# Patient Record
Sex: Female | Born: 1935 | Race: White | Hispanic: No | Marital: Married | Smoking: Former smoker
Health system: Southern US, Community
[De-identification: ages and names within clinical notes are randomized; demographics above are authoritative.]

## PROBLEM LIST (undated history)

## (undated) DIAGNOSIS — Z8711 Personal history of peptic ulcer disease: Secondary | ICD-10-CM

## (undated) DIAGNOSIS — I739 Peripheral vascular disease, unspecified: Secondary | ICD-10-CM

## (undated) DIAGNOSIS — D509 Iron deficiency anemia, unspecified: Secondary | ICD-10-CM

## (undated) DIAGNOSIS — I771 Stricture of artery: Secondary | ICD-10-CM

## (undated) DIAGNOSIS — K551 Chronic vascular disorders of intestine: Secondary | ICD-10-CM

## (undated) DIAGNOSIS — M545 Low back pain, unspecified: Secondary | ICD-10-CM

## (undated) DIAGNOSIS — I10 Essential (primary) hypertension: Secondary | ICD-10-CM

## (undated) DIAGNOSIS — D751 Secondary polycythemia: Secondary | ICD-10-CM

## (undated) DIAGNOSIS — J449 Chronic obstructive pulmonary disease, unspecified: Secondary | ICD-10-CM

## (undated) DIAGNOSIS — E041 Nontoxic single thyroid nodule: Secondary | ICD-10-CM

## (undated) DIAGNOSIS — Z720 Tobacco use: Secondary | ICD-10-CM

## (undated) DIAGNOSIS — I4892 Unspecified atrial flutter: Secondary | ICD-10-CM

## (undated) DIAGNOSIS — Z8719 Personal history of other diseases of the digestive system: Secondary | ICD-10-CM

## (undated) DIAGNOSIS — R634 Abnormal weight loss: Secondary | ICD-10-CM

## (undated) DIAGNOSIS — I495 Sick sinus syndrome: Secondary | ICD-10-CM

## (undated) DIAGNOSIS — M810 Age-related osteoporosis without current pathological fracture: Secondary | ICD-10-CM

## (undated) DIAGNOSIS — R911 Solitary pulmonary nodule: Secondary | ICD-10-CM

## (undated) HISTORY — PX: BLEPHAROPLASTY: SUR158

## (undated) HISTORY — DX: Iron deficiency anemia, unspecified: D50.9

## (undated) HISTORY — DX: Chronic obstructive pulmonary disease, unspecified: J44.9

## (undated) HISTORY — DX: Secondary polycythemia: D75.1

## (undated) HISTORY — PX: COLONOSCOPY: SHX174

## (undated) SURGERY — AORTIC INTERVENTION
Anesthesia: IV Sedation (MBSC Only)

---

## 1993-12-28 HISTORY — PX: STOMACH SURGERY: SHX791

## 2004-09-27 ENCOUNTER — Ambulatory Visit: Payer: Self-pay | Admitting: Internal Medicine

## 2004-10-28 ENCOUNTER — Ambulatory Visit: Payer: Self-pay | Admitting: Internal Medicine

## 2004-12-01 ENCOUNTER — Ambulatory Visit: Payer: Self-pay | Admitting: Internal Medicine

## 2004-12-23 ENCOUNTER — Ambulatory Visit: Payer: Self-pay | Admitting: Unknown Physician Specialty

## 2004-12-28 ENCOUNTER — Ambulatory Visit: Payer: Self-pay | Admitting: Internal Medicine

## 2005-01-28 ENCOUNTER — Ambulatory Visit: Payer: Self-pay | Admitting: Internal Medicine

## 2005-02-04 ENCOUNTER — Ambulatory Visit: Payer: Self-pay | Admitting: Unknown Physician Specialty

## 2005-02-25 ENCOUNTER — Ambulatory Visit: Payer: Self-pay | Admitting: Internal Medicine

## 2005-03-28 ENCOUNTER — Ambulatory Visit: Payer: Self-pay | Admitting: Internal Medicine

## 2005-04-27 ENCOUNTER — Ambulatory Visit: Payer: Self-pay | Admitting: Internal Medicine

## 2005-05-28 ENCOUNTER — Ambulatory Visit: Payer: Self-pay | Admitting: Internal Medicine

## 2005-07-31 ENCOUNTER — Ambulatory Visit: Payer: Self-pay | Admitting: Internal Medicine

## 2005-08-28 ENCOUNTER — Ambulatory Visit: Payer: Self-pay | Admitting: Internal Medicine

## 2005-09-27 ENCOUNTER — Ambulatory Visit: Payer: Self-pay | Admitting: Internal Medicine

## 2005-10-28 ENCOUNTER — Ambulatory Visit: Payer: Self-pay | Admitting: Internal Medicine

## 2005-11-27 ENCOUNTER — Ambulatory Visit: Payer: Self-pay | Admitting: Internal Medicine

## 2006-01-05 ENCOUNTER — Ambulatory Visit: Payer: Self-pay | Admitting: Unknown Physician Specialty

## 2006-01-26 ENCOUNTER — Ambulatory Visit: Payer: Self-pay | Admitting: Internal Medicine

## 2006-01-28 ENCOUNTER — Ambulatory Visit: Payer: Self-pay | Admitting: Internal Medicine

## 2006-04-27 ENCOUNTER — Ambulatory Visit: Payer: Self-pay | Admitting: Internal Medicine

## 2006-05-28 ENCOUNTER — Ambulatory Visit: Payer: Self-pay | Admitting: Internal Medicine

## 2006-06-27 ENCOUNTER — Ambulatory Visit: Payer: Self-pay | Admitting: Internal Medicine

## 2006-07-28 ENCOUNTER — Ambulatory Visit: Payer: Self-pay | Admitting: Internal Medicine

## 2006-08-28 ENCOUNTER — Ambulatory Visit: Payer: Self-pay | Admitting: Internal Medicine

## 2006-10-26 ENCOUNTER — Ambulatory Visit: Payer: Self-pay | Admitting: Internal Medicine

## 2006-10-28 ENCOUNTER — Ambulatory Visit: Payer: Self-pay | Admitting: Internal Medicine

## 2006-12-24 ENCOUNTER — Ambulatory Visit: Payer: Self-pay | Admitting: Internal Medicine

## 2006-12-28 ENCOUNTER — Ambulatory Visit: Payer: Self-pay | Admitting: Internal Medicine

## 2007-01-28 ENCOUNTER — Ambulatory Visit: Payer: Self-pay | Admitting: Internal Medicine

## 2007-03-04 ENCOUNTER — Ambulatory Visit: Payer: Self-pay | Admitting: Internal Medicine

## 2007-03-29 ENCOUNTER — Ambulatory Visit: Payer: Self-pay | Admitting: Internal Medicine

## 2007-05-03 ENCOUNTER — Ambulatory Visit: Payer: Self-pay | Admitting: Internal Medicine

## 2007-05-29 ENCOUNTER — Ambulatory Visit: Payer: Self-pay | Admitting: Internal Medicine

## 2007-06-28 ENCOUNTER — Ambulatory Visit: Payer: Self-pay | Admitting: Internal Medicine

## 2007-07-07 ENCOUNTER — Ambulatory Visit: Payer: Self-pay | Admitting: Internal Medicine

## 2007-07-21 ENCOUNTER — Ambulatory Visit: Payer: Self-pay | Admitting: Unknown Physician Specialty

## 2007-07-29 ENCOUNTER — Ambulatory Visit: Payer: Self-pay | Admitting: Internal Medicine

## 2007-08-29 ENCOUNTER — Ambulatory Visit: Payer: Self-pay | Admitting: Internal Medicine

## 2007-09-28 ENCOUNTER — Ambulatory Visit: Payer: Self-pay | Admitting: Internal Medicine

## 2007-10-29 ENCOUNTER — Ambulatory Visit: Payer: Self-pay | Admitting: Internal Medicine

## 2007-11-28 ENCOUNTER — Ambulatory Visit: Payer: Self-pay | Admitting: Internal Medicine

## 2007-12-09 ENCOUNTER — Ambulatory Visit: Payer: Self-pay | Admitting: Internal Medicine

## 2007-12-29 ENCOUNTER — Ambulatory Visit: Payer: Self-pay | Admitting: Internal Medicine

## 2008-02-03 ENCOUNTER — Ambulatory Visit: Payer: Self-pay | Admitting: Internal Medicine

## 2008-02-26 ENCOUNTER — Ambulatory Visit: Payer: Self-pay | Admitting: Internal Medicine

## 2008-03-28 ENCOUNTER — Ambulatory Visit: Payer: Self-pay | Admitting: Internal Medicine

## 2008-04-27 ENCOUNTER — Ambulatory Visit: Payer: Self-pay | Admitting: Internal Medicine

## 2008-05-28 ENCOUNTER — Ambulatory Visit: Payer: Self-pay | Admitting: Internal Medicine

## 2008-06-27 ENCOUNTER — Ambulatory Visit: Payer: Self-pay | Admitting: Internal Medicine

## 2008-07-28 ENCOUNTER — Ambulatory Visit: Payer: Self-pay | Admitting: Internal Medicine

## 2008-07-31 ENCOUNTER — Ambulatory Visit: Payer: Self-pay | Admitting: Unknown Physician Specialty

## 2008-08-28 ENCOUNTER — Ambulatory Visit: Payer: Self-pay | Admitting: Internal Medicine

## 2008-09-27 ENCOUNTER — Ambulatory Visit: Payer: Self-pay | Admitting: Internal Medicine

## 2008-10-28 ENCOUNTER — Ambulatory Visit: Payer: Self-pay | Admitting: Internal Medicine

## 2008-11-02 ENCOUNTER — Ambulatory Visit: Payer: Self-pay | Admitting: Internal Medicine

## 2008-11-27 ENCOUNTER — Ambulatory Visit: Payer: Self-pay | Admitting: Internal Medicine

## 2008-12-28 ENCOUNTER — Ambulatory Visit: Payer: Self-pay | Admitting: Internal Medicine

## 2009-01-28 ENCOUNTER — Ambulatory Visit: Payer: Self-pay | Admitting: Internal Medicine

## 2009-02-25 ENCOUNTER — Ambulatory Visit: Payer: Self-pay | Admitting: Internal Medicine

## 2009-03-28 ENCOUNTER — Ambulatory Visit: Payer: Self-pay | Admitting: Internal Medicine

## 2009-04-05 ENCOUNTER — Ambulatory Visit: Payer: Self-pay | Admitting: Internal Medicine

## 2009-04-27 ENCOUNTER — Ambulatory Visit: Payer: Self-pay | Admitting: Internal Medicine

## 2009-05-28 ENCOUNTER — Ambulatory Visit: Payer: Self-pay | Admitting: Internal Medicine

## 2009-07-05 ENCOUNTER — Ambulatory Visit: Payer: Self-pay | Admitting: Internal Medicine

## 2009-07-28 ENCOUNTER — Ambulatory Visit: Payer: Self-pay | Admitting: Internal Medicine

## 2009-08-27 ENCOUNTER — Ambulatory Visit: Payer: Self-pay | Admitting: Unknown Physician Specialty

## 2009-09-06 ENCOUNTER — Ambulatory Visit: Payer: Self-pay | Admitting: Internal Medicine

## 2009-09-27 ENCOUNTER — Ambulatory Visit: Payer: Self-pay | Admitting: Internal Medicine

## 2009-11-08 ENCOUNTER — Ambulatory Visit: Payer: Self-pay | Admitting: Internal Medicine

## 2009-11-14 ENCOUNTER — Ambulatory Visit: Payer: Self-pay | Admitting: Unknown Physician Specialty

## 2009-11-27 ENCOUNTER — Ambulatory Visit: Payer: Self-pay | Admitting: Internal Medicine

## 2009-12-28 ENCOUNTER — Ambulatory Visit: Payer: Self-pay | Admitting: Internal Medicine

## 2010-01-10 ENCOUNTER — Ambulatory Visit: Payer: Self-pay | Admitting: Internal Medicine

## 2010-01-28 ENCOUNTER — Ambulatory Visit: Payer: Self-pay | Admitting: Internal Medicine

## 2010-03-21 ENCOUNTER — Ambulatory Visit: Payer: Self-pay | Admitting: Internal Medicine

## 2010-03-28 ENCOUNTER — Ambulatory Visit: Payer: Self-pay | Admitting: Internal Medicine

## 2010-04-04 ENCOUNTER — Ambulatory Visit: Payer: Self-pay | Admitting: General Practice

## 2010-05-21 ENCOUNTER — Ambulatory Visit: Payer: Self-pay | Admitting: Internal Medicine

## 2010-05-28 ENCOUNTER — Ambulatory Visit: Payer: Self-pay | Admitting: Internal Medicine

## 2010-06-27 ENCOUNTER — Ambulatory Visit: Payer: Self-pay | Admitting: Internal Medicine

## 2010-07-30 ENCOUNTER — Ambulatory Visit: Payer: Self-pay | Admitting: Internal Medicine

## 2010-08-28 ENCOUNTER — Ambulatory Visit: Payer: Self-pay | Admitting: Internal Medicine

## 2010-09-27 ENCOUNTER — Ambulatory Visit: Payer: Self-pay | Admitting: Internal Medicine

## 2010-11-07 ENCOUNTER — Ambulatory Visit: Payer: Self-pay | Admitting: Unknown Physician Specialty

## 2010-11-19 ENCOUNTER — Ambulatory Visit: Payer: Self-pay | Admitting: Internal Medicine

## 2010-11-25 ENCOUNTER — Ambulatory Visit: Payer: Self-pay | Admitting: Unknown Physician Specialty

## 2010-11-27 ENCOUNTER — Ambulatory Visit: Payer: Self-pay | Admitting: Internal Medicine

## 2011-01-28 ENCOUNTER — Ambulatory Visit: Payer: Self-pay | Admitting: Internal Medicine

## 2011-02-26 ENCOUNTER — Ambulatory Visit: Payer: Self-pay | Admitting: Internal Medicine

## 2011-04-06 ENCOUNTER — Ambulatory Visit: Payer: Self-pay | Admitting: Internal Medicine

## 2011-04-28 ENCOUNTER — Ambulatory Visit: Payer: Self-pay | Admitting: Internal Medicine

## 2011-05-27 ENCOUNTER — Ambulatory Visit: Payer: Self-pay | Admitting: Unknown Physician Specialty

## 2011-06-08 ENCOUNTER — Ambulatory Visit: Payer: Self-pay | Admitting: Internal Medicine

## 2011-06-28 ENCOUNTER — Ambulatory Visit: Payer: Self-pay | Admitting: Internal Medicine

## 2011-09-07 ENCOUNTER — Ambulatory Visit: Payer: Self-pay | Admitting: Internal Medicine

## 2011-09-28 ENCOUNTER — Ambulatory Visit: Payer: Self-pay | Admitting: Internal Medicine

## 2011-12-02 ENCOUNTER — Ambulatory Visit: Payer: Self-pay | Admitting: Unknown Physician Specialty

## 2012-01-11 ENCOUNTER — Ambulatory Visit: Payer: Self-pay | Admitting: Internal Medicine

## 2012-01-29 ENCOUNTER — Ambulatory Visit: Payer: Self-pay | Admitting: Internal Medicine

## 2012-05-09 ENCOUNTER — Ambulatory Visit: Payer: Self-pay | Admitting: Internal Medicine

## 2012-05-09 LAB — CBC CANCER CENTER
Basophil %: 0.4 %
Eosinophil #: 0 x10 3/mm (ref 0.0–0.7)
Eosinophil %: 0.8 %
HGB: 13.9 g/dL (ref 12.0–16.0)
Lymphocyte #: 1.1 x10 3/mm (ref 1.0–3.6)
Lymphocyte %: 19.2 %
MCH: 27.1 pg (ref 26.0–34.0)
MCHC: 31.9 g/dL — ABNORMAL LOW (ref 32.0–36.0)
MCV: 85 fL (ref 80–100)
Monocyte %: 9.8 %
Neutrophil #: 4.1 x10 3/mm (ref 1.4–6.5)
Neutrophil %: 69.8 %
Platelet: 295 x10 3/mm (ref 150–440)
RBC: 5.12 10*6/uL (ref 3.80–5.20)
RDW: 17 % — ABNORMAL HIGH (ref 11.5–14.5)
WBC: 5.9 x10 3/mm (ref 3.6–11.0)

## 2012-05-28 ENCOUNTER — Ambulatory Visit: Payer: Self-pay | Admitting: Internal Medicine

## 2012-09-12 ENCOUNTER — Ambulatory Visit: Payer: Self-pay | Admitting: Internal Medicine

## 2012-09-12 LAB — CBC CANCER CENTER
Basophil #: 0 x10 3/mm (ref 0.0–0.1)
Eosinophil #: 0 x10 3/mm (ref 0.0–0.7)
Eosinophil %: 0.7 %
HCT: 44.9 % (ref 35.0–47.0)
Lymphocyte #: 1 x10 3/mm (ref 1.0–3.6)
MCH: 27 pg (ref 26.0–34.0)
MCHC: 31.8 g/dL — ABNORMAL LOW (ref 32.0–36.0)
MCV: 85 fL (ref 80–100)
Monocyte #: 0.3 x10 3/mm (ref 0.2–0.9)
Monocyte %: 5.9 %
Neutrophil %: 74.1 %
Platelet: 294 x10 3/mm (ref 150–440)
RDW: 18 % — ABNORMAL HIGH (ref 11.5–14.5)
WBC: 5.1 x10 3/mm (ref 3.6–11.0)

## 2012-09-27 ENCOUNTER — Ambulatory Visit: Payer: Self-pay | Admitting: Internal Medicine

## 2012-12-05 ENCOUNTER — Ambulatory Visit: Payer: Self-pay | Admitting: Physician Assistant

## 2012-12-28 ENCOUNTER — Ambulatory Visit: Payer: Self-pay | Admitting: Internal Medicine

## 2013-01-28 ENCOUNTER — Ambulatory Visit: Payer: Self-pay | Admitting: Internal Medicine

## 2013-02-03 LAB — CBC CANCER CENTER
Basophil #: 0 x10 3/mm (ref 0.0–0.1)
HCT: 40.1 % (ref 35.0–47.0)
Lymphocyte #: 1 x10 3/mm (ref 1.0–3.6)
Lymphocyte %: 17.2 %
MCH: 27 pg (ref 26.0–34.0)
MCHC: 32.8 g/dL (ref 32.0–36.0)
MCV: 83 fL (ref 80–100)
Monocyte #: 0.5 x10 3/mm (ref 0.2–0.9)
Monocyte %: 9 %
Neutrophil #: 4.1 x10 3/mm (ref 1.4–6.5)
Neutrophil %: 72.9 %
RBC: 4.86 10*6/uL (ref 3.80–5.20)

## 2013-02-03 LAB — IRON AND TIBC
Iron Bind.Cap.(Total): 428 ug/dL (ref 250–450)
Iron Saturation: 7 %
Unbound Iron-Bind.Cap.: 400 ug/dL

## 2013-02-03 LAB — FERRITIN: Ferritin (ARMC): 11 ng/mL (ref 8–388)

## 2013-02-03 LAB — CREATININE, SERUM: Creatinine: 0.87 mg/dL (ref 0.60–1.30)

## 2013-02-25 ENCOUNTER — Ambulatory Visit: Payer: Self-pay | Admitting: Internal Medicine

## 2013-05-09 ENCOUNTER — Ambulatory Visit: Payer: Self-pay | Admitting: Internal Medicine

## 2013-05-11 LAB — CBC CANCER CENTER
Basophil #: 0 x10 3/mm (ref 0.0–0.1)
Basophil %: 0.6 %
Eosinophil #: 0 x10 3/mm (ref 0.0–0.7)
Eosinophil %: 0.7 %
HGB: 13.4 g/dL (ref 12.0–16.0)
Lymphocyte #: 1.3 x10 3/mm (ref 1.0–3.6)
MCH: 26.3 pg (ref 26.0–34.0)
Monocyte #: 0.5 x10 3/mm (ref 0.2–0.9)
Monocyte %: 7.9 %
Neutrophil %: 72.1 %

## 2013-05-28 ENCOUNTER — Ambulatory Visit: Payer: Self-pay | Admitting: Internal Medicine

## 2013-08-17 ENCOUNTER — Ambulatory Visit: Payer: Self-pay | Admitting: Internal Medicine

## 2013-08-18 LAB — CBC CANCER CENTER
Basophil %: 0.5 %
Eosinophil #: 0 x10 3/mm (ref 0.0–0.7)
Eosinophil %: 0.8 %
HCT: 39.4 % (ref 35.0–47.0)
HGB: 13.1 g/dL (ref 12.0–16.0)
Lymphocyte %: 19 %
MCH: 26.8 pg (ref 26.0–34.0)
MCHC: 33.2 g/dL (ref 32.0–36.0)
MCV: 81 fL (ref 80–100)
Neutrophil #: 4 x10 3/mm (ref 1.4–6.5)
Neutrophil %: 69 %
Platelet: 281 x10 3/mm (ref 150–440)
RBC: 4.9 10*6/uL (ref 3.80–5.20)
RDW: 17.7 % — ABNORMAL HIGH (ref 11.5–14.5)
WBC: 5.8 x10 3/mm (ref 3.6–11.0)

## 2013-08-18 LAB — FERRITIN: Ferritin (ARMC): 11 ng/mL (ref 8–388)

## 2013-08-18 LAB — IRON AND TIBC: Iron: 30 ug/dL — ABNORMAL LOW (ref 50–170)

## 2013-08-28 ENCOUNTER — Ambulatory Visit: Payer: Self-pay | Admitting: Internal Medicine

## 2013-11-10 ENCOUNTER — Ambulatory Visit: Payer: Self-pay | Admitting: Internal Medicine

## 2013-11-10 LAB — CBC CANCER CENTER
Basophil #: 0 x10 3/mm (ref 0.0–0.1)
Basophil %: 0.8 %
Eosinophil #: 0 x10 3/mm (ref 0.0–0.7)
Eosinophil %: 0.5 %
HCT: 41.2 % (ref 35.0–47.0)
HGB: 13.3 g/dL (ref 12.0–16.0)
Lymphocyte %: 17.7 %
MCV: 81 fL (ref 80–100)
Monocyte %: 9.3 %
Neutrophil #: 3.9 x10 3/mm (ref 1.4–6.5)
Neutrophil %: 71.7 %
RBC: 5.1 10*6/uL (ref 3.80–5.20)
WBC: 5.5 x10 3/mm (ref 3.6–11.0)

## 2013-11-10 LAB — IRON AND TIBC
Iron: 44 ug/dL — ABNORMAL LOW (ref 50–170)
Unbound Iron-Bind.Cap.: 404 ug/dL

## 2013-11-27 ENCOUNTER — Ambulatory Visit: Payer: Self-pay | Admitting: Internal Medicine

## 2014-05-25 DIAGNOSIS — M810 Age-related osteoporosis without current pathological fracture: Secondary | ICD-10-CM | POA: Insufficient documentation

## 2014-05-25 DIAGNOSIS — D751 Secondary polycythemia: Secondary | ICD-10-CM | POA: Insufficient documentation

## 2014-05-25 DIAGNOSIS — D509 Iron deficiency anemia, unspecified: Secondary | ICD-10-CM | POA: Insufficient documentation

## 2014-05-25 DIAGNOSIS — J449 Chronic obstructive pulmonary disease, unspecified: Secondary | ICD-10-CM | POA: Insufficient documentation

## 2014-05-25 DIAGNOSIS — E785 Hyperlipidemia, unspecified: Secondary | ICD-10-CM | POA: Insufficient documentation

## 2014-05-25 DIAGNOSIS — R739 Hyperglycemia, unspecified: Secondary | ICD-10-CM | POA: Insufficient documentation

## 2014-06-01 ENCOUNTER — Ambulatory Visit: Payer: Self-pay | Admitting: Physician Assistant

## 2014-07-11 DIAGNOSIS — M48061 Spinal stenosis, lumbar region without neurogenic claudication: Secondary | ICD-10-CM | POA: Insufficient documentation

## 2014-07-11 DIAGNOSIS — M5116 Intervertebral disc disorders with radiculopathy, lumbar region: Secondary | ICD-10-CM | POA: Insufficient documentation

## 2014-08-10 ENCOUNTER — Ambulatory Visit: Payer: Self-pay | Admitting: Internal Medicine

## 2014-08-17 LAB — CBC CANCER CENTER
BASOS PCT: 0.8 %
Basophil #: 0 x10 3/mm (ref 0.0–0.1)
Eosinophil #: 0 x10 3/mm (ref 0.0–0.7)
Eosinophil %: 0.8 %
HCT: 29.1 % — ABNORMAL LOW (ref 35.0–47.0)
HGB: 8.7 g/dL — AB (ref 12.0–16.0)
LYMPHS PCT: 27.2 %
Lymphocyte #: 1.6 x10 3/mm (ref 1.0–3.6)
MCH: 18.9 pg — ABNORMAL LOW (ref 26.0–34.0)
MCHC: 29.7 g/dL — ABNORMAL LOW (ref 32.0–36.0)
MCV: 64 fL — ABNORMAL LOW (ref 80–100)
Monocyte #: 0.6 x10 3/mm (ref 0.2–0.9)
Monocyte %: 10.7 %
Neutrophil #: 3.6 x10 3/mm (ref 1.4–6.5)
Neutrophil %: 60.5 %
Platelet: 391 x10 3/mm (ref 150–440)
RBC: 4.58 10*6/uL (ref 3.80–5.20)
RDW: 21.5 % — ABNORMAL HIGH (ref 11.5–14.5)
WBC: 5.9 x10 3/mm (ref 3.6–11.0)

## 2014-08-28 ENCOUNTER — Ambulatory Visit: Payer: Self-pay | Admitting: Internal Medicine

## 2014-08-31 LAB — OCCULT BLOOD X 1 CARD TO LAB, STOOL
OCCULT BLOOD, FECES: POSITIVE
Occult Blood, Feces: NEGATIVE
Occult Blood, Feces: POSITIVE

## 2014-08-31 LAB — CBC CANCER CENTER
BASOS PCT: 0.8 %
Basophil #: 0 x10 3/mm (ref 0.0–0.1)
EOS PCT: 1.2 %
Eosinophil #: 0.1 x10 3/mm (ref 0.0–0.7)
HCT: 35.6 % (ref 35.0–47.0)
HGB: 10.7 g/dL — ABNORMAL LOW (ref 12.0–16.0)
Lymphocyte #: 1.1 x10 3/mm (ref 1.0–3.6)
Lymphocyte %: 18.5 %
MCH: 21.8 pg — ABNORMAL LOW (ref 26.0–34.0)
MCHC: 30.2 g/dL — ABNORMAL LOW (ref 32.0–36.0)
MCV: 72 fL — ABNORMAL LOW (ref 80–100)
Monocyte #: 0.5 x10 3/mm (ref 0.2–0.9)
Monocyte %: 8.9 %
Neutrophil #: 4.1 x10 3/mm (ref 1.4–6.5)
Neutrophil %: 70.6 %
Platelet: 339 x10 3/mm (ref 150–440)
RBC: 4.92 10*6/uL (ref 3.80–5.20)
RDW: 38 % — ABNORMAL HIGH (ref 11.5–14.5)
WBC: 5.8 x10 3/mm (ref 3.6–11.0)

## 2014-09-14 LAB — CANCER CENTER HEMOGLOBIN: HGB: 13.4 g/dL (ref 12.0–16.0)

## 2014-09-27 ENCOUNTER — Ambulatory Visit: Payer: Self-pay | Admitting: Internal Medicine

## 2014-09-28 LAB — CANCER CENTER HEMATOCRIT: HCT: 46.1 % (ref 35.0–47.0)

## 2014-10-03 DIAGNOSIS — Z86010 Personal history of colonic polyps: Secondary | ICD-10-CM | POA: Insufficient documentation

## 2014-10-25 ENCOUNTER — Ambulatory Visit: Payer: Self-pay | Admitting: Unknown Physician Specialty

## 2014-10-28 ENCOUNTER — Ambulatory Visit: Payer: Self-pay | Admitting: Internal Medicine

## 2014-11-06 ENCOUNTER — Ambulatory Visit: Payer: Self-pay | Admitting: Physician Assistant

## 2014-11-08 LAB — COMPREHENSIVE METABOLIC PANEL
ALK PHOS: 63 U/L
ANION GAP: 6 — AB (ref 7–16)
Albumin: 3.1 g/dL — ABNORMAL LOW (ref 3.4–5.0)
BUN: 13 mg/dL (ref 7–18)
Bilirubin,Total: 0.3 mg/dL (ref 0.2–1.0)
CALCIUM: 7.8 mg/dL — AB (ref 8.5–10.1)
CO2: 31 mmol/L (ref 21–32)
CREATININE: 0.79 mg/dL (ref 0.60–1.30)
Chloride: 103 mmol/L (ref 98–107)
EGFR (African American): >60
EGFR (Non-African Amer.): >60
Glucose: 126 mg/dL — ABNORMAL HIGH (ref 65–99)
Osmolality: 281 (ref 275–301)
POTASSIUM: 3.7 mmol/L (ref 3.5–5.1)
SGOT(AST): 27 U/L (ref 15–37)
SGPT (ALT): 23 U/L
Sodium: 140 mmol/L (ref 136–145)
Total Protein: 6.3 g/dL — ABNORMAL LOW (ref 6.4–8.2)

## 2014-11-08 LAB — CANCER CENTER HEMOGLOBIN: HGB: 13.8 g/dL (ref 12.0–16.0)

## 2014-11-08 LAB — LIPID PANEL
Cholesterol: 175 mg/dL (ref 0–200)
HDL: 68 mg/dL — AB (ref 40–60)
LDL CHOLESTEROL, CALC: 85 mg/dL (ref 0–100)
Triglycerides: 109 mg/dL (ref 0–200)
VLDL CHOLESTEROL, CALC: 22 mg/dL (ref 5–40)

## 2014-11-08 LAB — TSH: Thyroid Stimulating Horm: 1.45 u[IU]/mL

## 2014-11-08 LAB — HEMOGLOBIN A1C: Hemoglobin A1C: 6.2 % (ref 4.2–6.3)

## 2014-11-27 ENCOUNTER — Ambulatory Visit: Payer: Self-pay | Admitting: Internal Medicine

## 2014-12-06 LAB — CANCER CENTER HEMATOCRIT: HCT: 41.9 % (ref 35.0–47.0)

## 2014-12-18 ENCOUNTER — Ambulatory Visit: Payer: Self-pay | Admitting: Physician Assistant

## 2014-12-28 ENCOUNTER — Ambulatory Visit: Payer: Self-pay | Admitting: Internal Medicine

## 2015-01-17 LAB — CBC CANCER CENTER
BASOS PCT: 0.6 %
Basophil #: 0 x10 3/mm (ref 0.0–0.1)
Eosinophil #: 0 x10 3/mm (ref 0.0–0.7)
Eosinophil %: 0.4 %
HCT: 43.5 % (ref 35.0–47.0)
HGB: 14 g/dL (ref 12.0–16.0)
Lymphocyte #: 1.1 x10 3/mm (ref 1.0–3.6)
Lymphocyte %: 15.6 %
MCH: 27 pg (ref 26.0–34.0)
MCHC: 32.3 g/dL (ref 32.0–36.0)
MCV: 84 fL (ref 80–100)
Monocyte #: 0.5 x10 3/mm (ref 0.2–0.9)
Monocyte %: 7.5 %
Neutrophil #: 5.3 x10 3/mm (ref 1.4–6.5)
Neutrophil %: 75.9 %
Platelet: 380 x10 3/mm (ref 150–440)
RBC: 5.21 10*6/uL — ABNORMAL HIGH (ref 3.80–5.20)
RDW: 16.2 % — AB (ref 11.5–14.5)
WBC: 6.9 x10 3/mm (ref 3.6–11.0)

## 2015-01-28 ENCOUNTER — Ambulatory Visit: Payer: Self-pay | Admitting: Internal Medicine

## 2015-03-13 ENCOUNTER — Ambulatory Visit
Admit: 2015-03-13 | Disposition: A | Discharge status: Home / Self Care | Payer: Self-pay | Attending: Internal Medicine | Admitting: Internal Medicine

## 2015-03-13 ENCOUNTER — Ambulatory Visit: Admit: 2015-03-13 | Payer: Self-pay | Admitting: Unknown Physician Specialty

## 2015-03-27 LAB — IRON AND TIBC
Iron Bind.Cap.(Total): 424 (ref 250–450)
Iron Saturation: 6.8
Iron: 29 ug/dL
UNBOUND IRON-BIND. CAP.: 394.6

## 2015-03-27 LAB — FERRITIN: Ferritin (ARMC): 11 ng/mL

## 2015-03-27 LAB — OCCULT BLOOD X 1 CARD TO LAB, STOOL
OCCULT BLOOD, FECES: NEGATIVE
OCCULT BLOOD, FECES: POSITIVE
Occult Blood, Feces: POSITIVE

## 2015-03-27 LAB — CANCER CENTER HEMOGLOBIN: HGB: 13.6 g/dL (ref 12.0–16.0)

## 2015-03-29 ENCOUNTER — Ambulatory Visit
Admit: 2015-03-29 | Disposition: A | Discharge status: Home / Self Care | Payer: Self-pay | Attending: Internal Medicine | Admitting: Internal Medicine

## 2015-04-01 DIAGNOSIS — M5136 Other intervertebral disc degeneration, lumbar region: Secondary | ICD-10-CM | POA: Insufficient documentation

## 2015-05-24 ENCOUNTER — Ambulatory Visit: Payer: Self-pay

## 2015-05-24 ENCOUNTER — Ambulatory Visit: Payer: Self-pay | Admitting: Internal Medicine

## 2016-01-17 ENCOUNTER — Encounter: Payer: Self-pay | Admitting: *Deleted

## 2016-01-24 NOTE — Discharge Instructions (Signed)

## 2016-01-27 ENCOUNTER — Ambulatory Visit: Payer: Medicare HMO | Admitting: Anesthesiology

## 2016-01-27 ENCOUNTER — Encounter
Admission: RE | Disposition: A | Discharge status: Home / Self Care | Payer: Self-pay | Source: Ambulatory Visit | Attending: Ophthalmology

## 2016-01-27 ENCOUNTER — Ambulatory Visit
Admission: RE | Admit: 2016-01-27 | Discharge: 2016-01-27 | Disposition: A | Discharge status: Home / Self Care | Payer: Medicare HMO | Source: Ambulatory Visit | Attending: Ophthalmology | Admitting: Ophthalmology

## 2016-01-27 DIAGNOSIS — M810 Age-related osteoporosis without current pathological fracture: Secondary | ICD-10-CM | POA: Diagnosis not present

## 2016-01-27 DIAGNOSIS — I10 Essential (primary) hypertension: Secondary | ICD-10-CM | POA: Insufficient documentation

## 2016-01-27 DIAGNOSIS — Z880 Allergy status to penicillin: Secondary | ICD-10-CM | POA: Diagnosis not present

## 2016-01-27 DIAGNOSIS — H25041 Posterior subcapsular polar age-related cataract, right eye: Secondary | ICD-10-CM | POA: Insufficient documentation

## 2016-01-27 DIAGNOSIS — Z87891 Personal history of nicotine dependence: Secondary | ICD-10-CM | POA: Insufficient documentation

## 2016-01-27 DIAGNOSIS — Z79899 Other long term (current) drug therapy: Secondary | ICD-10-CM | POA: Diagnosis not present

## 2016-01-27 DIAGNOSIS — H269 Unspecified cataract: Secondary | ICD-10-CM | POA: Diagnosis present

## 2016-01-27 DIAGNOSIS — Z9889 Other specified postprocedural states: Secondary | ICD-10-CM | POA: Diagnosis not present

## 2016-01-27 HISTORY — PX: CATARACT EXTRACTION W/PHACO: SHX586

## 2016-01-27 HISTORY — DX: Low back pain, unspecified: M54.50

## 2016-01-27 HISTORY — DX: Age-related osteoporosis without current pathological fracture: M81.0

## 2016-01-27 HISTORY — DX: Personal history of peptic ulcer disease: Z87.11

## 2016-01-27 HISTORY — DX: Personal history of other diseases of the digestive system: Z87.19

## 2016-01-27 HISTORY — DX: Low back pain: M54.5

## 2016-01-27 SURGERY — PHACOEMULSIFICATION, CATARACT, WITH IOL INSERTION
Anesthesia: Monitor Anesthesia Care | Laterality: Right | Wound class: Clean

## 2016-01-27 MED ORDER — BRIMONIDINE TARTRATE 0.2 % OP SOLN
OPHTHALMIC | Status: DC | PRN
  Administered 2016-01-27: 1 [drp] via OPHTHALMIC

## 2016-01-27 MED ORDER — MOXIFLOXACIN HCL 0.5 % OP SOLN
OPHTHALMIC | Status: DC | PRN
Start: 2016-01-27 — End: 2016-01-27
  Administered 2016-01-27: 1 [drp] via OPHTHALMIC

## 2016-01-27 MED ORDER — TIMOLOL MALEATE 0.5 % OP SOLN
OPHTHALMIC | Status: DC | PRN
  Administered 2016-01-27: 1 [drp] via OPHTHALMIC

## 2016-01-27 MED ORDER — MIDAZOLAM HCL 2 MG/2ML IJ SOLN
INTRAMUSCULAR | Status: DC | PRN
  Administered 2016-01-27: 2 mg via INTRAVENOUS

## 2016-01-27 MED ORDER — LIDOCAINE HCL (PF) 4 % IJ SOLN
INTRAOCULAR | Status: DC | PRN
  Administered 2016-01-27: 1 mL via OPHTHALMIC

## 2016-01-27 MED ORDER — EPINEPHRINE HCL 1 MG/ML IJ SOLN
INTRAOCULAR | Status: DC | PRN
  Administered 2016-01-27: 97 mL via OPHTHALMIC

## 2016-01-27 MED ORDER — POVIDONE-IODINE 5 % OP SOLN
1.0000 "application " | OPHTHALMIC | Status: DC | PRN
  Administered 2016-01-27: 1 via OPHTHALMIC

## 2016-01-27 MED ORDER — NA HYALUR & NA CHOND-NA HYALUR 0.4-0.35 ML IO KIT
PACK | INTRAOCULAR | Status: DC | PRN
  Administered 2016-01-27: 1 mL via INTRAOCULAR

## 2016-01-27 MED ORDER — TETRACAINE HCL 0.5 % OP SOLN
1.0000 [drp] | OPHTHALMIC | Status: DC | PRN
  Administered 2016-01-27: 1 [drp] via OPHTHALMIC

## 2016-01-27 MED ORDER — FENTANYL CITRATE (PF) 100 MCG/2ML IJ SOLN
INTRAMUSCULAR | Status: DC | PRN
  Administered 2016-01-27: 50 ug via INTRAVENOUS

## 2016-01-27 MED ORDER — ARMC OPHTHALMIC DILATING GEL
1.0000 "application " | OPHTHALMIC | Status: DC | PRN
  Administered 2016-01-27 (×2): 1 via OPHTHALMIC

## 2016-01-27 SURGICAL SUPPLY — 29 items
APPLICATOR COTTON TIP 3IN (MISCELLANEOUS) ×3 IMPLANT
CANNULA ANT/CHMB 27GA (MISCELLANEOUS) ×3 IMPLANT
DISSECTOR HYDRO NUCLEUS 50X22 (MISCELLANEOUS) ×3 IMPLANT
GLOVE BIO SURGEON STRL SZ7 (GLOVE) ×3 IMPLANT
GLOVE SURG LX 6.5 MICRO (GLOVE) ×2
GLOVE SURG LX STRL 6.5 MICRO (GLOVE) ×1 IMPLANT
GOWN STRL REUS W/ TWL LRG LVL3 (GOWN DISPOSABLE) ×2 IMPLANT
GOWN STRL REUS W/TWL LRG LVL3 (GOWN DISPOSABLE) ×4
LENS IOL ACRSF IQ PC 21.5 (Intraocular Lens) ×1 IMPLANT
LENS IOL ACRYSOF IQ POST 21.5 (Intraocular Lens) ×3 IMPLANT
MARKER SKIN SURG W/RULER VIO (MISCELLANEOUS) ×3 IMPLANT
NEEDLE FILTER BLUNT 18X 1/2SAF (NEEDLE) ×2
NEEDLE FILTER BLUNT 18X1 1/2 (NEEDLE) ×1 IMPLANT
PACK CATARACT BRASINGTON (MISCELLANEOUS) ×3 IMPLANT
PACK EYE AFTER SURG (MISCELLANEOUS) ×3 IMPLANT
PACK OPTHALMIC (MISCELLANEOUS) ×3 IMPLANT
RING MALYGIN 7.0 (MISCELLANEOUS) IMPLANT
SOL BAL SALT 15ML (MISCELLANEOUS)
SOLUTION BAL SALT 15ML (MISCELLANEOUS) IMPLANT
SUT ETHILON 10-0 CS-B-6CS-B-6 (SUTURE)
SUT VICRYL  9 0 (SUTURE)
SUT VICRYL 9 0 (SUTURE) IMPLANT
SUTURE EHLN 10-0 CS-B-6CS-B-6 (SUTURE) IMPLANT
SYR 3ML LL SCALE MARK (SYRINGE) ×3 IMPLANT
SYR TB 1ML LUER SLIP (SYRINGE) ×3 IMPLANT
WATER STERILE IRR 250ML POUR (IV SOLUTION) ×3 IMPLANT
WATER STERILE IRR 500ML POUR (IV SOLUTION) IMPLANT
WICK EYE OCUCEL (MISCELLANEOUS) IMPLANT
WIPE NON LINTING 3.25X3.25 (MISCELLANEOUS) ×3 IMPLANT

## 2016-01-27 NOTE — Op Note (Signed)
Date of Surgery: 01/27/2016  PREOPERATIVE DIAGNOSES: Visually significant posterior subcapsular cataract, right eye.  POSTOPERATIVE DIAGNOSES: Same  PROCEDURES PERFORMED: Cataract extraction with intraocular lens implant, right eye.  SURGEON: Almon Hercules, M.D.  ANESTHESIA: MAC and topical  IMPLANTS: AcrySof IQ [id number] +21.5 D   Implant Name Type Inv. Item Serial No. Manufacturer Lot No. LRB No. Used  IMPLANT LENS - [id number] Intraocular Lens IMPLANT LENS [id number] ALCON   Right 1     COMPLICATIONS: None.  DESCRIPTION OF PROCEDURE: Therapeutic options were discussed with the patient preoperatively, including a discussion of risks and benefits of surgery. Informed consent was obtained. An IOL-Master and immersion biometry were used to take the lens measurements, and a dilated fundus exam was performed within 6 months of the surgical date.  The patient was premedicated and brought to the operating room and placed on the operating table in the supine position. After adequate anesthesia, the patient was prepped and draped in the usual sterile ophthalmic fashion. A wire lid speculum was inserted and the microscope was positioned. A Superblade was used to create a paracentesis site at the limbus and a small amount of dilute preservative free lidocaine was instilled into the anterior chamber, followed by dispersive viscoelastic. A clear corneal incision was created temporally using a 2.4 mm keratome blade. Capsulorrhexis was then performed. In situ phacoemulsification was performed.  Cortical material was removed with the irrigation-aspiration unit. Dispersive viscoelastic was instilled to open the capsular bag. A posterior chamber intraocular lens with the specifications above was inserted and positioned. Irrigation-aspiration was used to remove all viscoelastic. Vigamox 1cc was instilled into the anterior chamber, and the corneal incision was checked and found to be water tight. The eyelid  speculum was removed.  The operative eye was covered with protective goggles after instilling 1 drop of timolol and brimonidine. The patient tolerated the procedure well. There were no complications.

## 2016-01-27 NOTE — Anesthesia Preprocedure Evaluation (Signed)
Anesthesia Evaluation  Patient identified by MRN, date of birth, ID band Patient awake    Reviewed: Allergy & Precautions, NPO status , Patient's Chart, lab work & pertinent test results  Airway Mallampati: II  TM Distance: >3 FB Neck ROM: full    Dental   Pulmonary Current Smoker,    breath sounds clear to auscultation       Cardiovascular hypertension, Normal cardiovascular exam     Neuro/Psych    GI/Hepatic   Endo/Other    Renal/GU      Musculoskeletal   Abdominal   Peds  Hematology   Anesthesia Other Findings   Reproductive/Obstetrics                             Anesthesia Physical Anesthesia Plan  ASA: II  Anesthesia Plan: MAC   Post-op Pain Management:    Induction:   Airway Management Planned:   Additional Equipment:   Intra-op Plan:   Post-operative Plan:   Informed Consent: I have reviewed the patients History and Physical, chart, labs and discussed the procedure including the risks, benefits and alternatives for the proposed anesthesia with the patient or authorized representative who has indicated his/her understanding and acceptance.     Plan Discussed with: CRNA  Anesthesia Plan Comments:         Anesthesia Quick Evaluation

## 2016-01-27 NOTE — Transfer of Care (Signed)
Immediate Anesthesia Transfer of Care Note  Patient: Lydia [name]  Procedure(s) Performed: Procedure(s): CATARACT EXTRACTION PHACO AND INTRAOCULAR LENS PLACEMENT (IOC) (Right)  Patient Location: PACU  Anesthesia Type: MAC  Level of Consciousness: awake, alert  and patient cooperative  Airway and Oxygen Therapy: Patient Spontanous Breathing and Patient connected to supplemental oxygen  Post-op Assessment: Post-op Vital signs reviewed, Patient's Cardiovascular Status Stable, Respiratory Function Stable, Patent Airway and No signs of Nausea or vomiting  Post-op Vital Signs: Reviewed and stable  Complications: No apparent anesthesia complications

## 2016-01-27 NOTE — H&P (Signed)
H+P reviewed and is up to date, please see paper chart.  

## 2016-01-27 NOTE — Anesthesia Procedure Notes (Signed)
Procedure Name: MAC Performed by: Roneshia Drew Pre-anesthesia Checklist: Patient identified, Emergency Drugs available, Suction available, Timeout performed and Patient being monitored Patient Re-evaluated:Patient Re-evaluated prior to inductionOxygen Delivery Method: Nasal cannula Placement Confirmation: positive ETCO2     

## 2016-01-27 NOTE — Anesthesia Postprocedure Evaluation (Signed)
Anesthesia Post Note  Patient: Lydia [name]  Procedure(s) Performed: Procedure(s) (LRB): CATARACT EXTRACTION PHACO AND INTRAOCULAR LENS PLACEMENT (IOC) (Right)  Patient location during evaluation: PACU Anesthesia Type: MAC Level of consciousness: awake and alert Pain management: pain level controlled Vital Signs Assessment: post-procedure vital signs reviewed and stable Respiratory status: spontaneous breathing, nonlabored ventilation, respiratory function stable and patient connected to nasal cannula oxygen Cardiovascular status: stable and blood pressure returned to baseline Anesthetic complications: no    Amaryllis Dyke

## 2016-01-28 ENCOUNTER — Encounter: Payer: Self-pay | Admitting: Ophthalmology

## 2016-03-04 ENCOUNTER — Other Ambulatory Visit: Payer: Self-pay | Admitting: Physician Assistant

## 2016-03-04 DIAGNOSIS — Z1231 Encounter for screening mammogram for malignant neoplasm of breast: Secondary | ICD-10-CM

## 2016-03-12 ENCOUNTER — Ambulatory Visit: Payer: Medicare Other

## 2016-03-20 ENCOUNTER — Other Ambulatory Visit (HOSPITAL_COMMUNITY): Payer: Self-pay | Admitting: Neurosurgery

## 2016-03-20 DIAGNOSIS — M5416 Radiculopathy, lumbar region: Secondary | ICD-10-CM

## 2016-03-26 ENCOUNTER — Inpatient Hospital Stay: Payer: Medicare HMO | Admitting: Internal Medicine

## 2016-03-26 ENCOUNTER — Encounter: Payer: Self-pay | Admitting: *Deleted

## 2016-04-13 ENCOUNTER — Ambulatory Visit: Payer: Medicare HMO

## 2016-04-14 ENCOUNTER — Ambulatory Visit
Admission: RE | Admit: 2016-04-14 | Discharge: 2016-04-14 | Disposition: A | Discharge status: Home / Self Care | Payer: Medicare HMO | Source: Ambulatory Visit | Attending: Neurosurgery | Admitting: Neurosurgery

## 2016-04-14 DIAGNOSIS — M5116 Intervertebral disc disorders with radiculopathy, lumbar region: Secondary | ICD-10-CM | POA: Insufficient documentation

## 2016-04-14 DIAGNOSIS — M4806 Spinal stenosis, lumbar region: Secondary | ICD-10-CM | POA: Insufficient documentation

## 2016-04-14 DIAGNOSIS — M4186 Other forms of scoliosis, lumbar region: Secondary | ICD-10-CM | POA: Diagnosis not present

## 2016-04-14 DIAGNOSIS — M5416 Radiculopathy, lumbar region: Secondary | ICD-10-CM | POA: Insufficient documentation

## 2016-04-15 ENCOUNTER — Other Ambulatory Visit: Payer: Self-pay | Admitting: *Deleted

## 2016-04-15 DIAGNOSIS — D751 Secondary polycythemia: Secondary | ICD-10-CM

## 2016-04-16 ENCOUNTER — Inpatient Hospital Stay: Payer: Medicare HMO | Attending: Internal Medicine | Admitting: Internal Medicine

## 2016-04-16 ENCOUNTER — Encounter: Payer: Self-pay | Admitting: Internal Medicine

## 2016-04-16 ENCOUNTER — Telehealth: Payer: Self-pay | Admitting: *Deleted

## 2016-04-16 ENCOUNTER — Ambulatory Visit: Payer: Medicare HMO

## 2016-04-16 VITALS — BP 138/79 | HR 84 | Temp 96.5°F | Resp 18 | Ht 66.0 in | Wt 88.2 lb

## 2016-04-16 DIAGNOSIS — R634 Abnormal weight loss: Secondary | ICD-10-CM | POA: Diagnosis not present

## 2016-04-16 DIAGNOSIS — M810 Age-related osteoporosis without current pathological fracture: Secondary | ICD-10-CM | POA: Diagnosis not present

## 2016-04-16 DIAGNOSIS — M5136 Other intervertebral disc degeneration, lumbar region: Secondary | ICD-10-CM | POA: Insufficient documentation

## 2016-04-16 DIAGNOSIS — D508 Other iron deficiency anemias: Secondary | ICD-10-CM

## 2016-04-16 DIAGNOSIS — G8929 Other chronic pain: Secondary | ICD-10-CM

## 2016-04-16 DIAGNOSIS — I10 Essential (primary) hypertension: Secondary | ICD-10-CM | POA: Diagnosis not present

## 2016-04-16 DIAGNOSIS — D751 Secondary polycythemia: Secondary | ICD-10-CM | POA: Diagnosis not present

## 2016-04-16 DIAGNOSIS — Z880 Allergy status to penicillin: Secondary | ICD-10-CM | POA: Diagnosis not present

## 2016-04-16 DIAGNOSIS — R5383 Other fatigue: Secondary | ICD-10-CM | POA: Insufficient documentation

## 2016-04-16 DIAGNOSIS — J449 Chronic obstructive pulmonary disease, unspecified: Secondary | ICD-10-CM | POA: Diagnosis not present

## 2016-04-16 DIAGNOSIS — R05 Cough: Secondary | ICD-10-CM | POA: Diagnosis not present

## 2016-04-16 DIAGNOSIS — D509 Iron deficiency anemia, unspecified: Secondary | ICD-10-CM | POA: Diagnosis not present

## 2016-04-16 DIAGNOSIS — M4186 Other forms of scoliosis, lumbar region: Secondary | ICD-10-CM | POA: Insufficient documentation

## 2016-04-16 DIAGNOSIS — F17210 Nicotine dependence, cigarettes, uncomplicated: Secondary | ICD-10-CM | POA: Insufficient documentation

## 2016-04-16 DIAGNOSIS — M545 Low back pain: Secondary | ICD-10-CM | POA: Insufficient documentation

## 2016-04-16 DIAGNOSIS — Z79899 Other long term (current) drug therapy: Secondary | ICD-10-CM | POA: Insufficient documentation

## 2016-04-16 DIAGNOSIS — Z8719 Personal history of other diseases of the digestive system: Secondary | ICD-10-CM | POA: Diagnosis not present

## 2016-04-16 DIAGNOSIS — M4806 Spinal stenosis, lumbar region: Secondary | ICD-10-CM | POA: Insufficient documentation

## 2016-04-16 LAB — URINALYSIS COMPLETE WITH MICROSCOPIC (ARMC ONLY)
BILIRUBIN URINE: NEGATIVE
GLUCOSE, UA: NEGATIVE mg/dL
Hgb urine dipstick: NEGATIVE
KETONES UR: NEGATIVE mg/dL
NITRITE: NEGATIVE
Protein, ur: 30 mg/dL — AB
SPECIFIC GRAVITY, URINE: 1.018 (ref 1.005–1.030)
pH: 6 (ref 5.0–8.0)

## 2016-04-16 LAB — COMPREHENSIVE METABOLIC PANEL
ALT: 24 U/L (ref 14–54)
AST: 28 U/L (ref 15–41)
Albumin: 3.8 g/dL (ref 3.5–5.0)
Alkaline Phosphatase: 54 U/L (ref 38–126)
Anion gap: 4 — ABNORMAL LOW (ref 5–15)
BUN: 26 mg/dL — ABNORMAL HIGH (ref 6–20)
CHLORIDE: 100 mmol/L — AB (ref 101–111)
CO2: 32 mmol/L (ref 22–32)
CREATININE: 0.73 mg/dL (ref 0.44–1.00)
Calcium: 8.4 mg/dL — ABNORMAL LOW (ref 8.9–10.3)
Glucose, Bld: 70 mg/dL (ref 65–99)
POTASSIUM: 3.9 mmol/L (ref 3.5–5.1)
Sodium: 136 mmol/L (ref 135–145)
TOTAL PROTEIN: 6.5 g/dL (ref 6.5–8.1)
Total Bilirubin: 0.4 mg/dL (ref 0.3–1.2)

## 2016-04-16 LAB — CBC WITH DIFFERENTIAL/PLATELET
Basophils Absolute: 0 10*3/uL (ref 0–0.1)
Basophils Relative: 0 %
Eosinophils Absolute: 0 10*3/uL (ref 0–0.7)
Eosinophils Relative: 0 %
HCT: 35 % (ref 35.0–47.0)
Hemoglobin: 10.8 g/dL — ABNORMAL LOW (ref 12.0–16.0)
LYMPHS ABS: 1 10*3/uL (ref 1.0–3.6)
LYMPHS PCT: 15 %
MCH: 20.5 pg — AB (ref 26.0–34.0)
MCHC: 30.9 g/dL — ABNORMAL LOW (ref 32.0–36.0)
MCV: 66.5 fL — AB (ref 80.0–100.0)
MONO ABS: 0.5 10*3/uL (ref 0.2–0.9)
MONOS PCT: 8 %
Neutro Abs: 5.1 10*3/uL (ref 1.4–6.5)
Neutrophils Relative %: 77 %
PLATELETS: 323 10*3/uL (ref 150–440)
RBC: 5.26 MIL/uL — ABNORMAL HIGH (ref 3.80–5.20)
RDW: 24.2 % — AB (ref 11.5–14.5)
WBC: 6.6 10*3/uL (ref 3.6–11.0)

## 2016-04-16 LAB — LACTATE DEHYDROGENASE: LDH: 173 U/L (ref 98–192)

## 2016-04-16 LAB — RETICULOCYTES
RBC.: 5.26 MIL/uL — ABNORMAL HIGH (ref 3.80–5.20)
RETIC COUNT ABSOLUTE: 68.4 10*3/uL (ref 19.0–183.0)
Retic Ct Pct: 1.3 % (ref 0.4–3.1)

## 2016-04-16 LAB — FERRITIN: FERRITIN: 11 ng/mL (ref 11–307)

## 2016-04-16 LAB — IRON AND TIBC
IRON: 18 ug/dL — AB (ref 28–170)
Saturation Ratios: 4 % — ABNORMAL LOW (ref 10.4–31.8)
TIBC: 412 ug/dL (ref 250–450)
UIBC: 394 ug/dL

## 2016-04-16 NOTE — Telephone Encounter (Signed)
I called patient with her lab results this afternoon per her request. I asked pt to keep all of her upcoming appointments for IV iron.

## 2016-04-16 NOTE — Progress Notes (Signed)
[facility] OFFICE PROGRESS NOTE  Patient Care Team: Marinda Elk, MD as PCP - General (Physician Assistant)   SUMMARY OF ONCOLOGIC HISTORY:  # IDA  [Colonoscopy last Nov 2015- hem pos stool- incomplete; Dr. Elliot]  # POLYCYTHEMIA SECONDARY s/p Phlebotomy; none at least 2015  INTERVAL HISTORY:  This is my first interaction with the patient since I joined the practice September 2016. I reviewed the patient's prior charts/pertinent labs in detail; findings are summarized above.   A pleasant 82 year old female patient with history of iron deficiency anemia is here for follow-up. Patient stated that she has been tired especially with exertion. She denies any blood in stools. Denies any black stools. She does not take any by mouth iron-because of constipation. Denies any alteration of all habits. Denies any blood in urine.   Patient has had mild weight loss the last many months; patient is chronic cough; she smokes. No headaches or vision changes. Chronic back pain.   REVIEW OF SYSTEMS:  A complete 10 point review of system is done which is negative except mentioned above/history of present illness.   PAST MEDICAL HISTORY :  Past Medical History  Diagnosis Date  . Hypertension   . Osteoporosis   . Lower back pain   . History of stomach ulcers     40 yrs ago  . Polycythemia   . COPD (chronic obstructive pulmonary disease) ([place])   . IDA (iron deficiency anemia)     PAST SURGICAL HISTORY :   Past Surgical History  Procedure Laterality Date  . Blepharoplasty    . Stomach surgery  1995    for ulcers  . Colonoscopy    . Cataract extraction w/phaco Right 01/27/2016    Procedure: CATARACT EXTRACTION PHACO AND INTRAOCULAR LENS PLACEMENT (IOC);  Surgeon: Ronnell Freshwater, MD;  Location: [facility];  Service: Ophthalmology;  Laterality: Right;  . Colonoscopy  2005, 2010    FAMILY HISTORY :   Family History  Problem Relation Age of Onset  .  Breast cancer Sister     SOCIAL HISTORY:   Social History  Substance Use Topics  . Smoking status: Current Every Day Smoker -- 1.00 packs/day for 65 years    Types: Cigarettes  . Smokeless tobacco: Never Used  . Alcohol Use: No    ALLERGIES:  is allergic to penicillins.  MEDICATIONS:  Current Outpatient Prescriptions  Medication Sig Dispense Refill  . amLODipine (NORVASC) 5 MG tablet Take 5 mg by mouth daily. AM    . CALCIUM PO Take by mouth daily. AM    . Cholecalciferol (VITAMIN D PO) Take by mouth daily. AM    . metoprolol (LOPRESSOR) 100 MG tablet Take 100 mg by mouth daily. AM     No current facility-administered medications for this visit.    PHYSICAL EXAMINATION:   BP 138/79 mmHg  Pulse 84  Temp(Src) 96.5 F (35.8 C) (Tympanic)  Resp 18  Ht 5\' 6"  (1.676 m)  Wt 88 lb 2.9 oz (40 kg)  BMI 14.24 kg/m2  SpO2 98%  Filed Weights   04/16/16 1136  Weight: 88 lb 2.9 oz (40 kg)    GENERAL: Thin appearing moderately nourished Caucasian female patient Alert, no distress and comfortable.  EYES: no pallor or icterus OROPHARYNX: no thrush or ulceration; NECK: supple, no masses felt LYMPH:  no palpable lymphadenopathy in the cervical, axillary or inguinal regions LUNGS: Barrel Shaped chest ; decreased air entry bilaterally. No wheeze or crackles HEART/CVS: regular rate &  rhythm and no murmurs; No lower extremity edema ABDOMEN:abdomen soft, non-tender and normal bowel sounds Musculoskeletal:no cyanosis of digits and no clubbing kyphosis/scoliosis noted  PSYCH: alert & oriented x 3 with fluent speech NEURO: no focal motor/sensory deficits SKIN:  no rashes or significant lesions  LABORATORY DATA:  I have reviewed the data as listed    Component Value Date/Time   NA 140 11/08/2014 1341   K 3.7 11/08/2014 1341   CL 103 11/08/2014 1341   CO2 31 11/08/2014 1341   GLUCOSE 126* 11/08/2014 1341   BUN 13 11/08/2014 1341   CREATININE 0.79 11/08/2014 1341   CALCIUM 7.8*  11/08/2014 1341   PROT 6.3* 11/08/2014 1341   ALBUMIN 3.1* 11/08/2014 1341   AST 27 11/08/2014 1341   ALT 23 11/08/2014 1341   ALKPHOS 63 11/08/2014 1341   BILITOT 0.3 11/08/2014 1341   GFRNONAA >60 11/08/2014 1341   GFRNONAA >60 02/03/2013 1047   GFRAA >60 11/08/2014 1341   GFRAA >60 02/03/2013 1047    No results found for: SPEP, UPEP  Lab Results  Component Value Date   WBC 6.9 01/17/2015   NEUTROABS 5.3 01/17/2015   HGB 13.6 03/27/2015   HCT 43.5 01/17/2015   MCV 84 01/17/2015   PLT 380 01/17/2015      Chemistry      Component Value Date/Time   NA 140 11/08/2014 1341   K 3.7 11/08/2014 1341   CL 103 11/08/2014 1341   CO2 31 11/08/2014 1341   BUN 13 11/08/2014 1341   CREATININE 0.79 11/08/2014 1341      Component Value Date/Time   CALCIUM 7.8* 11/08/2014 1341   ALKPHOS 63 11/08/2014 1341   AST 27 11/08/2014 1341   ALT 23 11/08/2014 1341   BILITOT 0.3 11/08/2014 1341       RADIOGRAPHIC STUDIES: I have personally reviewed the radiological images as listed and agreed with the findings in the report. Mr Lumbar Spine Wo Contrast  04/14/2016  CLINICAL DATA:  82 year old female with lumbar back pain radiating to the left lower extremity for 2 years, slowly progressive. Radiculopathy. Subsequent encounter. EXAM: MRI LUMBAR SPINE WITHOUT CONTRAST TECHNIQUE: Multiplanar, multisequence MR imaging of the lumbar spine was performed. No intravenous contrast was administered. COMPARISON:  Lumbar MRI 06/01/2014. [facility] lumbar radiograph report 12/04/2013 (no images available). FINDINGS: Same numbering system as on the 2015 MRI designating normal lumbar segmentation. Levoconvex lumbar scoliosis with significant rotatory component appears mildly progressed since that time. There was associated left lateral listhesis of L2 on L3 with advanced chronic disc degeneration re- demonstrated at that level. There is also advanced left far lateral disc and endplate degeneration at  L4-L5. At both of those levels there is trace increased STIR signal in the disc space. There is new posterior left paracentral L4 endplate marrow edema, but this most resembles interval development of a Schmorl's node. No definite epidural inflammation. There is psoas muscle atrophy. There is left lumbosacral junction level erector spinae muscle atrophy with some associated STIR hyperintensity (series 4, image 21) which is chronic. Visualized lower thoracic spinal cord is normal with conus medularis at L2. Negative visualized abdominal and pelvic viscera. T10-T11:  Mild facet hypertrophy. T11-T12: Mild facet hypertrophy. T12-L1:  Mild facet hypertrophy. L1-L2: Mild to moderate facet hypertrophy. Chronic right facet joint fluid. No significant stenosis. L2-L3: Chronic disc and endplate degeneration with left greater than right far lateral disc osteophyte complex. Moderate facet and ligament flavum hypertrophy. Chronic multifactorial spinal stenosis appears stable and moderate.  This is at the level of the conus. No conus signal abnormality. Stable moderate to severe right L2 foraminal stenosis and right lateral recess stenosis. L3-L4: Increased chronic multifactorial spinal stenosis, now moderate to severe. Left eccentric circumferential disc osteophyte complex with increased now severe ligament flavum hypertrophy, and chronic moderate to severe facet hypertrophy. Posteriorly situated synovial cyst on the left is chronic. Right facet joint fluid is increased. There is a tiny subligamentous synovial cyst on the right not contributing to stenosis at this time (series 5, image 30). Moderate bilateral L3 foraminal stenosis is stable. Severe left lateral recess stenosis has progressed. L4-L5: Chronic severe multifactorial spinal stenosis. Previously-seen left lateral recess and foraminal level synovial cysts have regressed but not completely resolved (series 2, image 12). Severe to very severe left L4 foraminal and lateral  recess stenosis. Severe right lateral recess stenosis. Mild right L4 foraminal stenosis. Overall this level has not significantly changed. L5-S1: Mild grade 1 anterolisthesis is stable. Chronic left eccentric disc/pseudo disc with severe facet hypertrophy and chronic facet joint fluid. Moderate left ligament flavum hypertrophy. No significant spinal stenosis. Chronic moderate to severe left and mild right lateral recess stenosis. Chronic mild to moderate left and mild right L5 foraminal stenosis. This level is stable. IMPRESSION: 1. Since 2015 lumbar scoliosis and chronic L4-L5 disc and endplate degeneration have progressed. Abnormal disc and endplate signal at [id number] and L4-L5 appears to be degenerative in nature. 2. Regressed but not completely resolved left lateral recess and foraminal level synovial cysts at L4-L5. However, multifactorial severe to very severe spinal, left greater than right lateral recess, and left foraminal stenosis have not significantly changed. 3. Progressed and now moderate to severe multifactorial spinal stenosis at L3-L4 with severe left lateral recess stenosis. Moderate bilateral foraminal stenosis is stable. 4. Stable moderate multifactorial spinal stenosis at the level of the conus at L2-L3. Moderate to severe right lateral recess and right foraminal stenosis are stable. 5. Stable chronic moderate to severe left lateral recess and up to moderate left foraminal stenosis at L5-S1. Electronically Signed   By: Genevie Ann M.D.   On: 04/14/2016 14:28     ASSESSMENT & PLAN:   # Iron deficiency anemia- recent hemoglobin with PCP in March 2017- 9.3 MCV 68. Etiology is unclear. We'll repeat CBC and iron studies today ferritin LDH and reticulocyte count; stool occult 2; urine analysis. Recommend IV Feraheme starting next week x2.  # Patient will likely need repeat GI evaluation; check stool occult; again IV Feraheme in one month/ follow-up in one month.   # Chronic smoker- patient not  interested in smoking cessation.      [name], MD 04/16/2016 11:52 AM

## 2016-04-17 ENCOUNTER — Other Ambulatory Visit: Payer: Self-pay

## 2016-04-17 ENCOUNTER — Telehealth: Payer: Self-pay | Admitting: *Deleted

## 2016-04-17 DIAGNOSIS — D509 Iron deficiency anemia, unspecified: Secondary | ICD-10-CM | POA: Diagnosis not present

## 2016-04-17 DIAGNOSIS — D508 Other iron deficiency anemias: Secondary | ICD-10-CM

## 2016-04-17 LAB — OCCULT BLOOD X 1 CARD TO LAB, STOOL: Fecal Occult Bld: POSITIVE — AB

## 2016-04-17 NOTE — Telephone Encounter (Signed)
-----   Message from [name], MD sent at 04/17/2016  3:41 PM EDT ----- Please inform patient that her stool occult card is positive; she should contact her PCP ASAP/for GI referral for EGD colonoscopy. Thx

## 2016-04-17 NOTE — Telephone Encounter (Signed)
Pt called back. I explained to patient that her stool cards were positive for blood in stools. I asked her to contact her primary care for a GI referral . She states that Dr. Vira Agar has performed her colonoscopies in the past. She will contact her primary care and Dr. Percell Boston office to see when Dr. Percell [place] team could see her.

## 2016-04-17 NOTE — Telephone Encounter (Signed)
with husband to have pt call cancer center back

## 2016-04-24 ENCOUNTER — Inpatient Hospital Stay: Payer: Medicare HMO

## 2016-04-24 VITALS — BP 136/74 | HR 55 | Temp 96.6°F | Resp 20

## 2016-04-24 DIAGNOSIS — D509 Iron deficiency anemia, unspecified: Secondary | ICD-10-CM | POA: Diagnosis not present

## 2016-04-24 MED ORDER — FERUMOXYTOL INJECTION 510 MG/17 ML
510.0000 mg | Freq: Once | INTRAVENOUS | Status: AC
  Administered 2016-04-24: 510 mg via INTRAVENOUS
  Filled 2016-04-24: qty 17

## 2016-04-24 MED ORDER — SODIUM CHLORIDE 0.9 % IV SOLN
Freq: Once | INTRAVENOUS | Status: AC
  Administered 2016-04-24: 10:00:00 via INTRAVENOUS
  Filled 2016-04-24: qty 1000

## 2016-04-28 ENCOUNTER — Other Ambulatory Visit: Payer: Self-pay | Admitting: Nurse Practitioner

## 2016-04-28 DIAGNOSIS — R634 Abnormal weight loss: Secondary | ICD-10-CM

## 2016-04-29 ENCOUNTER — Other Ambulatory Visit: Payer: Self-pay | Admitting: Nurse Practitioner

## 2016-04-29 DIAGNOSIS — R634 Abnormal weight loss: Secondary | ICD-10-CM

## 2016-04-29 DIAGNOSIS — F17200 Nicotine dependence, unspecified, uncomplicated: Secondary | ICD-10-CM

## 2016-04-29 DIAGNOSIS — R9389 Abnormal findings on diagnostic imaging of other specified body structures: Secondary | ICD-10-CM

## 2016-05-01 ENCOUNTER — Inpatient Hospital Stay: Payer: Medicare HMO | Attending: Internal Medicine

## 2016-05-01 DIAGNOSIS — F17210 Nicotine dependence, cigarettes, uncomplicated: Secondary | ICD-10-CM | POA: Diagnosis not present

## 2016-05-01 DIAGNOSIS — M810 Age-related osteoporosis without current pathological fracture: Secondary | ICD-10-CM | POA: Diagnosis not present

## 2016-05-01 DIAGNOSIS — R5383 Other fatigue: Secondary | ICD-10-CM | POA: Diagnosis not present

## 2016-05-01 DIAGNOSIS — I10 Essential (primary) hypertension: Secondary | ICD-10-CM | POA: Diagnosis not present

## 2016-05-01 DIAGNOSIS — D509 Iron deficiency anemia, unspecified: Secondary | ICD-10-CM | POA: Diagnosis present

## 2016-05-01 DIAGNOSIS — D751 Secondary polycythemia: Secondary | ICD-10-CM | POA: Diagnosis not present

## 2016-05-01 DIAGNOSIS — R05 Cough: Secondary | ICD-10-CM | POA: Diagnosis not present

## 2016-05-01 DIAGNOSIS — Z79899 Other long term (current) drug therapy: Secondary | ICD-10-CM | POA: Insufficient documentation

## 2016-05-01 DIAGNOSIS — R634 Abnormal weight loss: Secondary | ICD-10-CM | POA: Insufficient documentation

## 2016-05-01 DIAGNOSIS — G8929 Other chronic pain: Secondary | ICD-10-CM | POA: Diagnosis not present

## 2016-05-01 DIAGNOSIS — M549 Dorsalgia, unspecified: Secondary | ICD-10-CM | POA: Diagnosis not present

## 2016-05-01 DIAGNOSIS — K921 Melena: Secondary | ICD-10-CM | POA: Diagnosis not present

## 2016-05-01 DIAGNOSIS — J449 Chronic obstructive pulmonary disease, unspecified: Secondary | ICD-10-CM | POA: Insufficient documentation

## 2016-05-01 MED ORDER — FERUMOXYTOL INJECTION 510 MG/17 ML
510.0000 mg | Freq: Once | INTRAVENOUS | Status: AC
  Administered 2016-05-01: 510 mg via INTRAVENOUS
  Filled 2016-05-01: qty 17

## 2016-05-01 MED ORDER — SODIUM CHLORIDE 0.9 % IV SOLN
Freq: Once | INTRAVENOUS | Status: AC
  Administered 2016-05-01: 14:00:00 via INTRAVENOUS
  Filled 2016-05-01: qty 1000

## 2016-05-04 ENCOUNTER — Ambulatory Visit: Admission: RE | Admit: 2016-05-04 | Payer: Medicare HMO | Source: Ambulatory Visit

## 2016-05-07 ENCOUNTER — Encounter: Payer: Medicare HMO | Attending: Surgery | Admitting: Surgery

## 2016-05-07 ENCOUNTER — Ambulatory Visit
Admission: RE | Admit: 2016-05-07 | Discharge: 2016-05-07 | Disposition: A | Discharge status: Home / Self Care | Payer: Medicare HMO | Source: Ambulatory Visit | Attending: Nurse Practitioner | Admitting: Nurse Practitioner

## 2016-05-07 DIAGNOSIS — M810 Age-related osteoporosis without current pathological fracture: Secondary | ICD-10-CM | POA: Diagnosis not present

## 2016-05-07 DIAGNOSIS — J479 Bronchiectasis, uncomplicated: Secondary | ICD-10-CM | POA: Diagnosis not present

## 2016-05-07 DIAGNOSIS — I10 Essential (primary) hypertension: Secondary | ICD-10-CM | POA: Insufficient documentation

## 2016-05-07 DIAGNOSIS — I2510 Atherosclerotic heart disease of native coronary artery without angina pectoris: Secondary | ICD-10-CM | POA: Insufficient documentation

## 2016-05-07 DIAGNOSIS — R9389 Abnormal findings on diagnostic imaging of other specified body structures: Secondary | ICD-10-CM

## 2016-05-07 DIAGNOSIS — E041 Nontoxic single thyroid nodule: Secondary | ICD-10-CM | POA: Diagnosis not present

## 2016-05-07 DIAGNOSIS — R634 Abnormal weight loss: Secondary | ICD-10-CM

## 2016-05-07 DIAGNOSIS — D509 Iron deficiency anemia, unspecified: Secondary | ICD-10-CM | POA: Insufficient documentation

## 2016-05-07 DIAGNOSIS — L89152 Pressure ulcer of sacral region, stage 2: Secondary | ICD-10-CM | POA: Diagnosis present

## 2016-05-07 DIAGNOSIS — E440 Moderate protein-calorie malnutrition: Secondary | ICD-10-CM | POA: Insufficient documentation

## 2016-05-07 DIAGNOSIS — R938 Abnormal findings on diagnostic imaging of other specified body structures: Secondary | ICD-10-CM | POA: Diagnosis present

## 2016-05-07 DIAGNOSIS — M4185 Other forms of scoliosis, thoracolumbar region: Secondary | ICD-10-CM | POA: Diagnosis not present

## 2016-05-07 DIAGNOSIS — D259 Leiomyoma of uterus, unspecified: Secondary | ICD-10-CM | POA: Diagnosis not present

## 2016-05-07 DIAGNOSIS — M47895 Other spondylosis, thoracolumbar region: Secondary | ICD-10-CM | POA: Diagnosis not present

## 2016-05-07 DIAGNOSIS — J449 Chronic obstructive pulmonary disease, unspecified: Secondary | ICD-10-CM | POA: Diagnosis not present

## 2016-05-07 DIAGNOSIS — M5135 Other intervertebral disc degeneration, thoracolumbar region: Secondary | ICD-10-CM | POA: Diagnosis not present

## 2016-05-07 DIAGNOSIS — F17218 Nicotine dependence, cigarettes, with other nicotine-induced disorders: Secondary | ICD-10-CM | POA: Insufficient documentation

## 2016-05-07 DIAGNOSIS — R64 Cachexia: Secondary | ICD-10-CM | POA: Insufficient documentation

## 2016-05-07 DIAGNOSIS — R911 Solitary pulmonary nodule: Secondary | ICD-10-CM | POA: Insufficient documentation

## 2016-05-07 DIAGNOSIS — J439 Emphysema, unspecified: Secondary | ICD-10-CM | POA: Diagnosis not present

## 2016-05-07 DIAGNOSIS — I700 Atherosclerosis of aorta: Secondary | ICD-10-CM | POA: Insufficient documentation

## 2016-05-07 DIAGNOSIS — F17200 Nicotine dependence, unspecified, uncomplicated: Secondary | ICD-10-CM

## 2016-05-07 MED ORDER — IOPAMIDOL (ISOVUE-300) INJECTION 61%
85.0000 mL | Freq: Once | INTRAVENOUS | Status: AC | PRN
  Administered 2016-05-07: 85 mL via INTRAVENOUS

## 2016-05-08 NOTE — Progress Notes (Signed)
Lydia [name], Lydia [name] ([id number]) Visit Report for 05/07/2016 Allergy List Details Patient Name: Lydia [name], Lydia [name]. Date of Service: 05/07/2016 8:00 AM Medical Record Number: [id number] Patient Account Number: [id number] Date of Birth/Sex: May 31, 1927 (82 y.o. Female) Treating RN: Cornell Barman Primary Care Physician: Paulita Cradle Other Clinician: Referring Physician: Gaylyn Cheers Treating Physician/Extender: Frann Rider in Treatment: 0 Allergies Active Allergies Penicillins Allergy Notes Electronic Signature(s) Signed: 05/07/2016 5:50:20 PM By: Gretta Cool, RN, BSN, Kim RN, BSN Entered By: Gretta Cool, RN, BSN, Kim on 05/07/2016 08:14:40 Lydia [name] ([id number]) -------------------------------------------------------------------------------- Arrival Information Details Patient Name: Lydia Lora [name]. Date of Service: 05/07/2016 8:00 AM Medical Record Number: [id number] Patient Account Number: [id number] Date of Birth/Sex: 1927-01-23 (82 y.o. Female) Treating RN: Cornell Barman Primary Care Physician: Paulita Cradle Other Clinician: Referring Physician: Gaylyn Cheers Treating Physician/Extender: Frann Rider in Treatment: 0 Visit Information Patient Arrived: Ambulatory Arrival Time: 08:11 Accompanied By: self Transfer Assistance: None Patient Identification Verified: Yes Secondary Verification Process Yes Completed: Patient Requires Transmission-Based No Precautions: Patient Has Alerts: No Electronic Signature(s) Signed: 05/07/2016 5:50:20 PM By: Gretta Cool, RN, BSN, Kim RN, BSN Entered By: Gretta Cool, RN, BSN, Kim on 05/07/2016 08:11:59 Lydia [name], Lydia [name] ([id number]) -------------------------------------------------------------------------------- Clinic Level of Care Assessment Details Patient Name: Lydia Lora [name]. Date of Service: 05/07/2016 8:00 AM Medical Record Number: [id number] Patient Account Number: [id number] Date of Birth/Sex: [birth year] (82 y.o. Female) Treating RN:  Cornell Barman Primary Care Physician: Paulita Cradle Other Clinician: Referring Physician: Gaylyn Cheers Treating Physician/Extender: Frann Rider in Treatment: 0 Clinic Level of Care Assessment Items TOOL 2 Quantity Score []  - Use when only an EandM is performed on the INITIAL visit 0 ASSESSMENTS - Nursing Assessment / Reassessment X - General Physical Exam (combine w/ comprehensive assessment (listed just 1 20 below) when performed on new pt. evals) X - Comprehensive Assessment (HX, ROS, Risk Assessments, Wounds Hx, etc.) 1 25 ASSESSMENTS - Wound and Skin Assessment / Reassessment X - Simple Wound Assessment / Reassessment - one wound 1 5 []  - Complex Wound Assessment / Reassessment - multiple wounds 0 []  - Dermatologic / Skin Assessment (not related to wound area) 0 ASSESSMENTS - Ostomy and/or Continence Assessment and Care []  - Incontinence Assessment and Management 0 []  - Ostomy Care Assessment and Management (repouching, etc.) 0 PROCESS - Coordination of Care X - Simple Patient / Family Education for ongoing care 1 15 []  - Complex (extensive) Patient / Family Education for ongoing care 0 X - Staff obtains Programmer, systems, Records, Test Results / Process Orders 1 10 []  - Staff telephones HHA, Nursing Homes / Clarify orders / etc 0 []  - Routine Transfer to another Facility (non-emergent condition) 0 []  - Routine Hospital Admission (non-emergent condition) 0 []  - New Admissions / Biomedical engineer / Ordering NPWT, Apligraf, etc. 0 []  - Emergency Hospital Admission (emergent condition) 0 X - Simple Discharge Coordination 1 10 Lydia [name], Lydia [name]. ([id number]) []  - Complex (extensive) Discharge Coordination 0 PROCESS - Special Needs []  - Pediatric / Minor Patient Management 0 []  - Isolation Patient Management 0 []  - Hearing / Language / Visual special needs 0 []  - Assessment of Community assistance (transportation, [name]/C planning, etc.) 0 []  - Additional assistance / Altered  mentation 0 []  - Support Surface(s) Assessment (bed, cushion, seat, etc.) 0 INTERVENTIONS - Wound Cleansing / Measurement X - Wound Imaging (photographs - any number of wounds) 1 5 []  - Wound Tracing (instead of photographs) 0 X - Simple Wound Measurement - one wound 1 5 []  - Complex  Wound Measurement - multiple wounds 0 X - Simple Wound Cleansing - one wound 1 5 []  - Complex Wound Cleansing - multiple wounds 0 INTERVENTIONS - Wound Dressings X - Small Wound Dressing one or multiple wounds 1 10 []  - Medium Wound Dressing one or multiple wounds 0 []  - Large Wound Dressing one or multiple wounds 0 []  - Application of Medications - injection 0 INTERVENTIONS - Miscellaneous []  - External ear exam 0 []  - Specimen Collection (cultures, biopsies, blood, body fluids, etc.) 0 []  - Specimen(s) / Culture(s) sent or taken to Lab for analysis 0 []  - Patient Transfer (multiple staff / Civil Service fast streamer / Similar devices) 0 []  - Simple Staple / Suture removal (25 or less) 0 []  - Complex Staple / Suture removal (26 or more) 0 Lydia [name], Lydia [name]. ([id number]) []  - Hypo / Hyperglycemic Management (close monitor of Blood Glucose) 0 []  - Ankle / Brachial Index (ABI) - do not check if billed separately 0 Has the patient been seen at the hospital within the last three years: Yes Total Score: 110 Level Of Care: New/Established - Level 3 Electronic Signature(s) Signed: 05/07/2016 5:50:20 PM By: Gretta Cool, RN, BSN, Kim RN, BSN Entered By: Gretta Cool, RN, BSN, Kim on 05/07/2016 08:41:08 Lydia [name], Lydia [name] ([id number]) -------------------------------------------------------------------------------- Encounter Discharge Information Details Patient Name: Lydia Lora [name]. Date of Service: 05/07/2016 8:00 AM Medical Record Number: [id number] Patient Account Number: [id number] Date of Birth/Sex: 22-Nov-1927 (82 y.o. Female) Treating RN: Cornell Barman Primary Care Physician: Paulita Cradle Other Clinician: Referring Physician: Gaylyn Cheers Treating Physician/Extender: Frann Rider in Treatment: 0 Encounter Discharge Information Items Discharge Pain Level: 0 Discharge Condition: Stable Ambulatory Status: Ambulatory Discharge Destination: Home Transportation: Private Auto Accompanied By: self Schedule Follow-up Appointment: Yes Medication Reconciliation completed and provided to Patient/Care Yes Lewin Pellow: Provided on Clinical Summary of Care: 05/07/2016 Form Type Recipient Paper Patient [facility] Electronic Signature(s) Signed: 05/07/2016 8:46:25 AM By: Ruthine Dose Entered By: Ruthine Dose on 05/07/2016 08:46:25 Smarr, Delories [name]. ([id number]) -------------------------------------------------------------------------------- Multi Wound Chart Details Patient Name: Lydia Lora [name]. Date of Service: 05/07/2016 8:00 AM Medical Record Number: [id number] Patient Account Number: [id number] Date of Birth/Sex: 1927-04-21 (82 y.o. Female) Treating RN: Cornell Barman Primary Care Physician: Paulita Cradle Other Clinician: Referring Physician: Gaylyn Cheers Treating Physician/Extender: Frann Rider in Treatment: 0 Vital Signs Height(in): 66 Pulse(bpm): 73 Weight(lbs): 88.5 Blood Pressure 130/91 (mmHg): Body Mass Index(BMI): 14 Temperature(F): 97.6 Respiratory Rate 18 (breaths/min): Photos: [1:No Photos] [N/A:N/A] Wound Location: [1:Sacrum - Medial] [N/A:N/A] Wounding Event: [1:Pressure Injury] [N/A:N/A] Primary Etiology: [1:Pressure Ulcer] [N/A:N/A] Comorbid History: [1:Cataracts, Anemia, Chronic Obstructive Pulmonary Disease (COPD), Hypertension] [N/A:N/A] Date Acquired: [1:04/27/2014] [N/A:N/A] Weeks of Treatment: [1:0] [N/A:N/A] Wound Status: [1:Open] [N/A:N/A] Measurements L x W x [name] 0.8x0.5x0.1 [N/A:N/A] (cm) Area (cm) : [1:0.314] [N/A:N/A] Volume (cm) : [1:0.031] [N/A:N/A] Classification: [1:Category/Stage II] [N/A:N/A] Exudate Amount: [1:Small] [N/A:N/A] Exudate Type: [1:Serous]  [N/A:N/A] Exudate Color: [1:amber] [N/A:N/A] Wound Margin: [1:Flat and Intact] [N/A:N/A] Granulation Amount: [1:Small (1-33%)] [N/A:N/A] Granulation Quality: [1:Pink] [N/A:N/A] Necrotic Amount: [1:Medium (34-66%)] [N/A:N/A] Exposed Structures: [1:Fascia: No Fat: No Tendon: No Muscle: No Joint: No Bone: No] [N/A:N/A] Limited to Skin Breakdown Epithelialization: None N/A N/A Periwound Skin Texture: Edema: No N/A N/A Excoriation: No Induration: No Callus: No Crepitus: No Fluctuance: No Friable: No Rash: No Scarring: No Periwound Skin Maceration: No N/A N/A Moisture: Moist: No Dry/Scaly: No Periwound Skin Color: Atrophie Blanche: No N/A N/A Cyanosis: No Ecchymosis: No Erythema: No Hemosiderin Staining: No Mottled: No Pallor: No Rubor: No Tenderness on No  N/A N/A Palpation: Wound Preparation: Ulcer Cleansing: N/A N/A Rinsed/Irrigated with Saline Topical Anesthetic Applied: Other: lidocaine 4% Treatment Notes Electronic Signature(s) Signed: 05/07/2016 5:50:20 PM By: Gretta Cool, RN, BSN, Kim RN, BSN Entered By: Gretta Cool, RN, BSN, Kim on 05/07/2016 08:36:48 Lydia [name] ([id number]) -------------------------------------------------------------------------------- [place] Details Patient Name: Lydia Lora [name]. Date of Service: 05/07/2016 8:00 AM Medical Record Number: [id number] Patient Account Number: [id number] Date of Birth/Sex: 1927-06-16 (82 y.o. Female) Treating RN: Cornell Barman Primary Care Physician: Paulita Cradle Other Clinician: Referring Physician: Gaylyn Cheers Treating Physician/Extender: Frann Rider in Treatment: 0 Active Inactive Abuse / Safety / Falls / Self Care Management Nursing Diagnoses: Potential for falls Goals: Patient will remain injury free Date Initiated: 05/07/2016 Goal Status: Active Interventions: Assess self care needs on admission and as needed Notes: Nutrition Nursing Diagnoses: Imbalanced  nutrition Goals: Patient/caregiver agrees to and verbalizes understanding of need to use nutritional supplements and/or vitamins as prescribed Date Initiated: 05/07/2016 Goal Status: Active Interventions: Assess patient nutrition upon admission and as needed per policy Notes: Orientation to the Wound Care Program Nursing Diagnoses: Knowledge deficit related to the wound healing center program Goals: Patient/caregiver will verbalize understanding of the [place], [place]. ([id number]) Date Initiated: 05/07/2016 Goal Status: Active Interventions: Provide education on orientation to the wound center Notes: Wound/Skin Impairment Nursing Diagnoses: Impaired tissue integrity Goals: Ulcer/skin breakdown will heal within 14 weeks Date Initiated: 05/07/2016 Goal Status: Active Interventions: Assess ulceration(s) every visit Notes: Electronic Signature(s) Signed: 05/07/2016 5:50:20 PM By: Gretta Cool, RN, BSN, Kim RN, BSN Entered By: Gretta Cool, RN, BSN, Kim on 05/07/2016 08:35:47 Lydia [name], Lydia [name]. ([id number]) -------------------------------------------------------------------------------- Pain Assessment Details Patient Name: Lydia Lora [name]. Date of Service: 05/07/2016 8:00 AM Medical Record Number: [id number] Patient Account Number: [id number] Date of Birth/Sex: Sep 26, 1927 (82 y.o. Female) Treating RN: Cornell Barman Primary Care Physician: Paulita Cradle Other Clinician: Referring Physician: Gaylyn Cheers Treating Physician/Extender: Frann Rider in Treatment: 0 Active Problems Location of Pain Severity and Description of Pain Patient Has Paino Yes Site Locations Pain Location: Pain in Ulcers Rate the pain. Current Pain Level: 10 Worst Pain Level: 10 Least Pain Level: 7 Character of Pain Describe the Pain: Burning, Throbbing Pain Management and Medication Current Pain Management: Medication: No Cold Application: No Rest: No Massage:  No Activity: No T.E.N.S.: No Heat Application: No Leg drop or elevation: No Is the Current Pain Management Inadequate Adequate: How does your pain impact your activities of daily livingo Sleep: No Bathing: No Appetite: No Relationship With Others: No Bladder Continence: No Emotions: No Bowel Continence: No Work: No Toileting: No Drive: No Dressing: No Hobbies: No Electronic Signature(s) Signed: 05/07/2016 5:50:20 PM By: Gretta Cool, RN, BSN, Kim RN, BSN Lydia [name], Lydia DMarland Kitchen ([id number]) Entered By: Gretta Cool, RN, BSN, Kim on 05/07/2016 08:13:09 Lydia [name], Lydia [name] ([id number]) -------------------------------------------------------------------------------- Wound Assessment Details Patient Name: Lydia Lora [name]. Date of Service: 05/07/2016 8:00 AM Medical Record Number: [id number] Patient Account Number: [id number] Date of Birth/Sex: 1927/03/18 (82 y.o. Female) Treating RN: Cornell Barman Primary Care Physician: Paulita Cradle Other Clinician: Referring Physician: Gaylyn Cheers Treating Physician/Extender: Frann Rider in Treatment: 0 Wound Status Wound Number: 1 Primary Pressure Ulcer Etiology: Wound Location: Sacrum - Medial Wound Open Wounding Event: Pressure Injury Status: Date Acquired: 04/27/2014 Comorbid Cataracts, Anemia, Chronic Weeks Of Treatment: 0 History: Obstructive Pulmonary Disease Clustered Wound: No (COPD), Hypertension Photos Photo Uploaded By: Gretta Cool, RN, BSN, Kim on 05/07/2016 16:11:50 Wound Measurements Length: (cm) 0.8 Width: (cm) 0.5 Depth: (cm) 0.1 Area: (cm)  0.314 Volume: (cm) 0.031 % Reduction in Area: 0% % Reduction in Volume: 0% Epithelialization: None Tunneling: No Undermining: No Wound Description Classification: Category/Stage II Wound Margin: Flat and Intact Exudate Amount: Medium Exudate Type: Serous Exudate Color: amber Wound Bed Granulation Amount: Small (1-33%) Exposed Structure Granulation Quality: Pink Fascia Exposed:  No Necrotic Amount: Medium (34-66%) Fat Layer Exposed: No Necrotic Quality: Adherent Slough Tendon Exposed: No Lydia [name], Lydia [name]. ([id number]) Muscle Exposed: No Joint Exposed: No Bone Exposed: No Limited to Skin Breakdown Periwound Skin Texture Texture Color No Abnormalities Noted: No No Abnormalities Noted: No Callus: No Atrophie Blanche: No Crepitus: No Cyanosis: No Excoriation: No Ecchymosis: No Fluctuance: No Erythema: No Friable: No Hemosiderin Staining: No Induration: No Mottled: No Localized Edema: No Pallor: No Rash: No Rubor: No Scarring: No Moisture No Abnormalities Noted: No Dry / Scaly: No Maceration: No Moist: Yes Wound Preparation Ulcer Cleansing: Rinsed/Irrigated with Saline Topical Anesthetic Applied: Other: lidocaine 4%, Treatment Notes Wound #1 (Medial Sacrum) 1. Cleansed with: Clean wound with Normal Saline 2. Anesthetic Topical Lidocaine 4% cream to wound bed prior to debridement 4. Dressing Applied: Aquacel Ag 5. Secondary Dressing Applied Bordered Foam Dressing Notes VItamin A; Vitamin C and Zinc Electronic Signature(s) Signed: 05/07/2016 5:50:20 PM By: Gretta Cool, RN, BSN, Kim RN, BSN Entered By: Gretta Cool, RN, BSN, Kim on 05/07/2016 08:47:27 Lydia [name], Lydia [name] ([id number]) -------------------------------------------------------------------------------- Vitals Details Patient Name: Lydia Lora [name]. Date of Service: 05/07/2016 8:00 AM Medical Record Number: [id number] Patient Account Number: [id number] Date of Birth/Sex: 12/20/1927 (82 y.o. Female) Treating RN: Cornell Barman Primary Care Physician: Paulita Cradle Other Clinician: Referring Physician: Gaylyn Cheers Treating Physician/Extender: Frann Rider in Treatment: 0 Vital Signs Time Taken: 08:13 Temperature (F): 97.6 Height (in): 66 Pulse (bpm): 73 Weight (lbs): 88.5 Respiratory Rate (breaths/min): 18 Body Mass Index (BMI): 14.3 Blood Pressure (mmHg): 130/91 Reference  Range: 80 - 120 mg / dl Electronic Signature(s) Signed: 05/07/2016 5:50:20 PM By: Gretta Cool, RN, BSN, Kim RN, BSN Entered By: Gretta Cool, RN, BSN, Kim on 05/07/2016 08:13:48

## 2016-05-08 NOTE — Progress Notes (Signed)
Lydia, [name] ([id number]) Visit Report for 05/07/2016 Abuse/Suicide Risk Screen Details Patient Name: Lydia [name], Lydia [name]. Date of Service: 05/07/2016 8:00 AM Medical Record Number: [id number] Patient Account Number: [id number] Date of Birth/Sex: 03/31/1927 (82 y.o. Female) Treating RN: Cornell Barman Primary Care Physician: Paulita Cradle Other Clinician: Referring Physician: Gaylyn Cheers Treating Physician/Extender: Frann Rider in Treatment: 0 Abuse/Suicide Risk Screen Items Answer ABUSE/SUICIDE RISK SCREEN: Has anyone close to you tried to hurt or harm you recentlyo No Do you feel uncomfortable with anyone in your familyo No Has anyone forced you do things that you didnot want to doo No Do you have any thoughts of harming yourselfo No Patient displays signs or symptoms of abuse and/or neglect. No Electronic Signature(s) Signed: 05/07/2016 5:50:20 PM By: Gretta Cool, RN, BSN, Kim RN, BSN Entered By: Gretta Cool, RN, BSN, Kim on 05/07/2016 08:19:26 [name] ([id number]) -------------------------------------------------------------------------------- Activities of Daily Living Details Patient Name: Lydia, [name] Date of Service: 05/07/2016 8:00 AM Medical Record Number: [id number] Patient Account Number: [id number] Date of Birth/Sex: July 30, 1927 (82 y.o. Female) Treating RN: Cornell Barman Primary Care Physician: Paulita Cradle Other Clinician: Referring Physician: Gaylyn Cheers Treating Physician/Extender: Frann Rider in Treatment: 0 Activities of Daily Living Items Answer Activities of Daily Living (Please select one for each item) Drive Automobile Completely Able Take Medications Completely Able Use Telephone Completely Able Care for Appearance Completely Able Use Toilet Completely Able Bath / Shower Completely Able Dress Self Completely Able Feed Self Completely Able Walk Completely Able Get In / Out Bed Completely Able Housework Completely Able Prepare  Meals Completely Able Handle Money Completely Able Shop for Self Completely Able Electronic Signature(s) Signed: 05/07/2016 5:50:20 PM By: Gretta Cool, RN, BSN, Kim RN, BSN Entered By: Gretta Cool, RN, BSN, Kim on 05/07/2016 08:19:35 Sobecki, Toni Arthurs ([id number]) -------------------------------------------------------------------------------- Education Assessment Details Patient Name: Lydia [name] Date of Service: 05/07/2016 8:00 AM Medical Record Number: [id number] Patient Account Number: [id number] Date of Birth/Sex: [birth year] (82 y.o. Female) Treating RN: Cornell Barman Primary Care Physician: Paulita Cradle Other Clinician: Referring Physician: Gaylyn Cheers Treating Physician/Extender: Frann Rider in Treatment: 0 Learning Preferences/Education Level/Primary Language Learning Preference: Explanation, Demonstration Highest Education Level: High School Preferred Language: English Cognitive Barrier Assessment/Beliefs Language Barrier: No Translator Needed: No Memory Deficit: No Emotional Barrier: No Cultural/Religious Beliefs Affecting Medical No Care: Physical Barrier Assessment Impaired Vision: No Impaired Hearing: No Knowledge/Comprehension Assessment Knowledge Level: High Comprehension Level: High Ability to understand written High instructions: Ability to understand verbal High instructions: Motivation Assessment Anxiety Level: Calm Cooperation: Cooperative Education Importance: Acknowledges Need Interest in Health Problems: Asks Questions Perception: Coherent Willingness to Engage in Self- High Management Activities: Readiness to Engage in Self- High Management Activities: Electronic Signature(s) Signed: 05/07/2016 5:50:20 PM By: Gretta Cool, RN, BSN, Kim RN, BSN Hollibaugh, Zlaty DMarland Kitchen ([id number]) Entered By: Gretta Cool, RN, BSN, Kim on 05/07/2016 08:20:04 [name] ([id number]) -------------------------------------------------------------------------------- Fall  Risk Assessment Details Patient Name: Lydia [name] Date of Service: 05/07/2016 8:00 AM Medical Record Number: [id number] Patient Account Number: [id number] Date of Birth/Sex: [birth year] (82 y.o. Female) Treating RN: Cornell Barman Primary Care Physician: Paulita Cradle Other Clinician: Referring Physician: Gaylyn Cheers Treating Physician/Extender: Frann Rider in Treatment: 0 Fall Risk Assessment Items Have you had 2 or more falls in the last 12 monthso 0 No Have you had any fall that resulted in injury in the last 12 monthso 0 No FALL RISK ASSESSMENT: History of falling - immediate or within 3 months 0 No Secondary diagnosis 0 No Ambulatory  aid None/bed rest/wheelchair/nurse 0 Yes Crutches/cane/walker 0 No Furniture 0 No IV Access/Saline Lock 0 No Gait/Training Normal/bed rest/immobile 0 No Weak 10 Yes Impaired 0 No Mental Status Oriented to own ability 0 Yes Electronic Signature(s) Signed: 05/07/2016 5:50:20 PM By: Gretta Cool, RN, BSN, Kim RN, BSN Entered By: Gretta Cool, RN, BSN, Kim on 05/07/2016 [id number] [name] ([id number]) -------------------------------------------------------------------------------- Nutrition Risk Assessment Details Patient Name: Lydia [name] Date of Service: 05/07/2016 8:00 AM Medical Record Number: [id number] Patient Account Number: [id number] Date of Birth/Sex: 02-13-1927 (82 y.o. Female) Treating RN: Cornell Barman Primary Care Physician: Paulita Cradle Other Clinician: Referring Physician: Gaylyn Cheers Treating Physician/Extender: Frann Rider in Treatment: 0 Height (in): 66 Weight (lbs): 88.5 Body Mass Index (BMI): 14.3 Nutrition Risk Assessment Items NUTRITION RISK SCREEN: I have an illness or condition that made me change the kind and/or 0 No amount of food I eat I eat fewer than two meals per day 0 No I eat few fruits and vegetables, or milk products 0 No I have three or more drinks of beer, liquor or wine  almost every day 0 No I have tooth or mouth problems that make it hard for me to eat 0 No I don't always have enough money to buy the food I need 0 No I eat alone most of the time 0 No I take three or more different prescribed or over-the-counter drugs a 1 Yes day Without wanting to, I have lost or gained 10 pounds in the last six 2 Yes months I am not always physically able to shop, cook and/or feed myself 0 No Nutrition Protocols Good Risk Protocol Moderate Risk Protocol Provide education on High Risk Proctocol 0 Electronic Signature(s) Signed: 05/07/2016 5:50:20 PM By: Gretta Cool, RN, BSN, Kim RN, BSN Entered By: Gretta Cool, RN, BSN, Kim on 05/07/2016 08:21:35

## 2016-05-08 NOTE — Progress Notes (Signed)
Lydia [name] ([id number]) Visit Report for 05/07/2016 Chief Complaint Document Details Patient Name: Lydia, [name]. Date of Service: 05/07/2016 8:00 AM Medical Record Patient Account Number: [id number] Number: Afful, RN, BSN, Treating RN: [birth year] (82 y.o. Lydia [name] Date of Birth/Sex: Female) Other Clinician: Primary Care Physician: Carrie Mew, MIRIAM Treating Christin Fudge Referring Physician: Gaylyn Cheers Physician/Extender: Suella Grove in Treatment: 0 Information Obtained from: Patient Chief Complaint Patient is at the clinic for treatment of an open pressure ulcer to the sacral region which she's had for about 2 years Electronic Signature(s) Signed: 05/07/2016 8:49:26 AM By: Christin Fudge MD, FACS Entered By: Christin Fudge on 05/07/2016 08:49:26 [name], [name] ([id number]) -------------------------------------------------------------------------------- HPI Details Patient Name: Lydia [name] Date of Service: 05/07/2016 8:00 AM Medical Record Patient Account Number: [id number] Number: Afful, RN, BSN, Treating RN: Nov 08, 1927 (82 y.o. Lydia [name] Date of Birth/Sex: Female) Other Clinician: Primary Care Physician: Carrie Mew, MIRIAM Treating Tiffney Haughton Referring Physician: Gaylyn Cheers Physician/Extender: Weeks in Treatment: 0 History of Present Illness Location: pressure injury to the right of the midline in the sacral region for about 2 years Quality: Patient reports experiencing a dull pain to affected area(s). Severity: Patient states wound are getting worse. Duration: Patient has had the wound for > 2 years prior to seeking treatment at the wound center Timing: Pain in wound is Intermittent (comes and goes Context: The wound would happen gradually Modifying Factors: Other treatment(s) tried include:zinc oxide paste has been applied to this Associated Signs and Symptoms: Patient reports having difficulty standing for long periods. HPI Description:  82 year old patient with iron deficiency anemia has a past medical history significant for hypertension, osteoporosis,duodenal ulcers, COPD, status post gastric surgery for ulcers, colonoscopy. she is a smoker and smokes about a pack of cigarettes a day for about 65 years. most recent lab work done showed WBC count of 6.6 hemoglobin of 10.8 hematocrit of 35 and platelets of 323 and her BMP was within normal limits. A venous duplex study on the right lower extremity done in November 2015 showed no venous reflux and no evidence of DVT. she was seen by her PCP recently and he found a stage II sacral decubitus ulcer and referred her to others. Electronic Signature(s) Signed: 05/07/2016 8:50:22 AM By: Christin Fudge MD, FACS Previous Signature: 05/07/2016 8:24:31 AM Version By: Christin Fudge MD, FACS Entered By: Christin Fudge on 05/07/2016 08:50:22 Lydia [name] ([id number]) -------------------------------------------------------------------------------- Physical Exam Details Patient Name: Lydia [name] Date of Service: 05/07/2016 8:00 AM Medical Record Patient Account Number: [id number] Number: Afful, RN, BSN, Treating RN: 1927/01/16 (82 y.o. Lydia [name] Date of Birth/Sex: Female) Other Clinician: Primary Care Physician: Carrie Mew, MIRIAM Treating Christin Fudge Referring Physician: Gaylyn Cheers Physician/Extender: Weeks in Treatment: 0 Constitutional . Pulse regular. Respirations normal and unlabored. Afebrile. . Eyes Nonicteric. Reactive to light. Ears, Nose, Mouth, and Throat Lips, teeth, and gums WNL.Marland Kitchen Moist mucosa without lesions. Neck supple and nontender. No palpable supraclavicular or cervical adenopathy. Normal sized without goiter. Respiratory WNL. No retractions.. Cardiovascular Pedal Pulses WNL. No clubbing, cyanosis or edema. Gastrointestinal (GI) Abdomen without masses or tenderness.. No liver or spleen enlargement or tenderness.. Lymphatic No adneopathy. No  adenopathy. No adenopathy. Musculoskeletal Adexa without tenderness or enlargement.. Digits and nails w/o clubbing, cyanosis, infection, petechiae, ischemia, or inflammatory conditions.. Integumentary (Hair, Skin) No suspicious lesions. No crepitus or fluctuance. No peri-wound warmth or erythema. No masses.Marland Kitchen Psychiatric Judgement and insight Intact.. No evidence of depression, anxiety, or agitation.. Notes in the sacral region just to the right  of the midline she has a stage II pressure ulcer which is fairly clean and has healthy granulation tissue at the base. There is no surrounding cellulitis. She has a lot of bony prominences in this area Electronic Signature(s) Signed: 05/07/2016 8:51:17 AM By: Christin Fudge MD, FACS Entered By: Christin Fudge on 05/07/2016 08:51:17 Lydia [name] ([id number]) -------------------------------------------------------------------------------- Physician Orders Details Patient Name: Lydia [name] Date of Service: 05/07/2016 8:00 AM Medical Record Number: [id number] Patient Account Number: [id number] Date of Birth/Sex: November 07, 1927 (82 y.o. Female) Treating RN: Cornell Barman Primary Care Physician: Paulita Cradle Other Clinician: Referring Physician: Gaylyn Cheers Treating Physician/Extender: Frann Rider in Treatment: 0 Verbal / Phone Orders: Yes Clinician: Cornell Barman Read Back and Verified: Yes Diagnosis Coding Wound Cleansing Wound #1 Medial Sacrum o Clean wound with Normal Saline. o May Shower, gently pat wound dry prior to applying new dressing. Anesthetic Wound #1 Medial Sacrum o Topical Lidocaine 4% cream applied to wound bed prior to debridement Primary Wound Dressing Wound #1 Medial Sacrum o Aquacel Ag Secondary Dressing Wound #1 Medial Sacrum o Boardered Foam Dressing Dressing Change Frequency Wound #1 Medial Sacrum o Change dressing every other day. Follow-up Appointments Wound #1 Medial Sacrum o Return  Appointment in 1 week. Off-Loading Wound #1 Medial Sacrum o Turn and reposition every 2 hours Additional Orders / Instructions Wound #1 Medial Sacrum o Stop Smoking - Follow-up with PCP for smoking cessation prescription. o Increase protein intake. o Activity as tolerated [name], [name] ([id number]) Medications-please add to medication list. o Other: - VItamin A; Vitamin C and Zinc Electronic Signature(s) Signed: 05/07/2016 4:06:04 PM By: Christin Fudge MD, FACS Signed: 05/07/2016 5:50:20 PM By: Gretta Cool RN, BSN, Kim RN, BSN Entered By: Gretta Cool, RN, BSN, Kim on 05/07/2016 08:50:37 Chretien, Yurika DMarland Kitchen ([id number]) -------------------------------------------------------------------------------- Problem List Details Patient Name: Lydia [name] Date of Service: 05/07/2016 8:00 AM Medical Record Number: [id number] Patient Account Number: [id number] Date of Birth/Sex: Aug 31, 1927 (82 y.o. Female) Treating RN: Primary Care Physician: Paulita Cradle Other Clinician: Referring Physician: Gaylyn Cheers Treating Physician/Extender: Weeks in Treatment: 0 Active Problems ICD-10 Encounter Code Description Active Date Diagnosis L89.152 Pressure ulcer of sacral region, stage 2 05/07/2016 Yes E44.0 Moderate protein-calorie malnutrition 05/07/2016 Yes F17.218 Nicotine dependence, cigarettes, with other nicotine- 05/07/2016 Yes induced disorders D50.9 Iron deficiency anemia, unspecified 05/07/2016 Yes Inactive Problems Resolved Problems Electronic Signature(s) Signed: 05/07/2016 8:54:34 AM By: Christin Fudge MD, FACS Previous Signature: 05/07/2016 8:49:08 AM Version By: Christin Fudge MD, FACS Entered By: Christin Fudge on 05/07/2016 08:54:34 Monterosso, Tannya D. ([id number]) -------------------------------------------------------------------------------- Progress Note Details Patient Name: Lydia [name] Date of Service: 05/07/2016 8:00 AM Medical Record Patient Account Number:  [id number] Number: Afful, RN, BSN, Treating RN: 1927-10-06 (82 y.o. Lydia [name] Date of Birth/Sex: Female) Other Clinician: Primary Care Physician: Carrie Mew, MIRIAM Treating Aysel Gilchrest Referring Physician: Gaylyn Cheers Physician/Extender: Suella Grove in Treatment: 0 Subjective Chief Complaint Information obtained from Patient Patient is at the clinic for treatment of an open pressure ulcer to the sacral region which she's had for about 2 years History of Present Illness (HPI) The following HPI elements were documented for the patient's wound: Location: pressure injury to the right of the midline in the sacral region for about 2 years Quality: Patient reports experiencing a dull pain to affected area(s). Severity: Patient states wound are getting worse. Duration: Patient has had the wound for > 2 years prior to seeking treatment at the wound center Timing: Pain in wound is Intermittent (comes and goes Context: The wound  would happen gradually Modifying Factors: Other treatment(s) tried include:zinc oxide paste has been applied to this Associated Signs and Symptoms: Patient reports having difficulty standing for long periods. 82 year old patient with iron deficiency anemia has a past medical history significant for hypertension, osteoporosis,duodenal ulcers, COPD, status post gastric surgery for ulcers, colonoscopy. she is a smoker and smokes about a pack of cigarettes a day for about 65 years. most recent lab work done showed WBC count of 6.6 hemoglobin of 10.8 hematocrit of 35 and platelets of 323 and her BMP was within normal limits. A venous duplex study on the right lower extremity done in November 2015 showed no venous reflux and no evidence of DVT. she was seen by her PCP recently and he found a stage II sacral decubitus ulcer and referred her to others. Wound History Patient presents with 1 open wound that has been present for approximately 2 years. Patient has  been treating wound in the following manner: zinc. Laboratory tests have been performed in the last month. Patient reportedly has not tested positive for an antibiotic resistant organism. Patient reportedly has not tested positive for osteomyelitis. Patient reportedly has not had testing performed to evaluate circulation in the legs. Patient experiences the following problems associated with their wounds: infection. Patient History Information obtained from Patient, Caregiver. NORLENE, HODGKIN ([id number]) Allergies Penicillins Family History Cancer - Siblings, No family history of Diabetes, Heart Disease, Hypertension, Kidney Disease, Lung Disease, Seizures, Stroke, Thyroid Problems. Social History Current every day smoker - pack daily, Marital Status - Married, Alcohol Use - Never, Drug Use - No History, Caffeine Use - Daily. Medical History Eyes Patient has history of Cataracts - removed Denies history of Glaucoma Ear/Nose/Mouth/Throat Denies history of Chronic sinus problems/congestion, Middle ear problems Hematologic/Lymphatic Patient has history of Anemia - iron infusion this week Denies history of Hemophilia, Human Immunodeficiency Virus, Lymphedema, Sickle Cell Disease Respiratory Patient has history of Chronic Obstructive Pulmonary Disease (COPD) Denies history of Aspiration, Asthma, Pneumothorax, Sleep Apnea Cardiovascular Patient has history of Hypertension - medication controlled Denies history of Angina, Arrhythmia, Congestive Heart Failure, Coronary Artery Disease, Deep Vein Thrombosis, Hypotension, Myocardial Infarction, Peripheral Arterial Disease, Peripheral Venous Disease, Phlebitis, Vasculitis Gastrointestinal Denies history of Cirrhosis , Colitis, Crohn s, Hepatitis A, Hepatitis B, Hepatitis C Endocrine Denies history of Type I Diabetes, Type II Diabetes Genitourinary Denies history of End Stage Renal Disease Immunological Denies history of Lupus  Erythematosus, Raynaud s, Scleroderma Integumentary (Skin) Denies history of History of Burn, History of pressure wounds Musculoskeletal Denies history of Gout, Rheumatoid Arthritis, Osteoarthritis, Osteomyelitis Neurologic Denies history of Dementia, Neuropathy, Quadriplegia, Paraplegia, Seizure Disorder Oncologic Denies history of Received Chemotherapy, Received Radiation Psychiatric Denies history of Anorexia/bulimia, Confinement Anxiety Review of Systems (ROS) Constitutional Symptoms (General Health) [name], [name] ([id number]) The patient has no complaints or symptoms. Eyes The patient has no complaints or symptoms. Ear/Nose/Mouth/Throat The patient has no complaints or symptoms. Hematologic/Lymphatic The patient has no complaints or symptoms. Respiratory The patient has no complaints or symptoms. Cardiovascular The patient has no complaints or symptoms. Gastrointestinal The patient has no complaints or symptoms. Endocrine The patient has no complaints or symptoms. Genitourinary The patient has no complaints or symptoms. Immunological The patient has no complaints or symptoms. Integumentary (Skin) Complains or has symptoms of Wounds, Bleeding or bruising tendency. Denies complaints or symptoms of Breakdown, Swelling. Musculoskeletal The patient has no complaints or symptoms. Neurologic The patient has no complaints or symptoms. Oncologic The patient has no complaints or symptoms. Psychiatric The  patient has no complaints or symptoms. Medications metoprolol succinate ER 100 mg tablet,extended release 24 hr oral tablet extended release 24 hr oral amlodipine 5 mg tablet oral tablet oral calcium carbonate 600 mg (1,500 mg)-vitamin D3 200 unit tablet oral tablet oral NuLYTELY with Flavor Packs 420 gram oral solution oral recon soln oral Objective Constitutional Pulse regular. Respirations normal and unlabored. Afebrile. Lydia, MUES D. ([id number]) Vitals Time  Taken: 8:13 AM, Height: 66 in, Weight: 88.5 lbs, BMI: 14.3, Temperature: 97.6 F, Pulse: 73 bpm, Respiratory Rate: 18 breaths/min, Blood Pressure: 130/91 mmHg. Eyes Nonicteric. Reactive to light. Ears, Nose, Mouth, and Throat Lips, teeth, and gums WNL.Marland Kitchen Moist mucosa without lesions. Neck supple and nontender. No palpable supraclavicular or cervical adenopathy. Normal sized without goiter. Respiratory WNL. No retractions.. Cardiovascular Pedal Pulses WNL. No clubbing, cyanosis or edema. Gastrointestinal (GI) Abdomen without masses or tenderness.. No liver or spleen enlargement or tenderness.. Lymphatic No adneopathy. No adenopathy. No adenopathy. Musculoskeletal Adexa without tenderness or enlargement.. Digits and nails w/o clubbing, cyanosis, infection, petechiae, ischemia, or inflammatory conditions.Marland Kitchen Psychiatric Judgement and insight Intact.. No evidence of depression, anxiety, or agitation.. General Notes: in the sacral region just to the right of the midline she has a stage II pressure ulcer which is fairly clean and has healthy granulation tissue at the base. There is no surrounding cellulitis. She has a lot of bony prominences in this area Integumentary (Hair, Skin) No suspicious lesions. No crepitus or fluctuance. No peri-wound warmth or erythema. No masses.. Wound #1 status is Open. Original cause of wound was Pressure Injury. The wound is located on the Medial Sacrum. The wound measures 0.8cm length x 0.5cm width x 0.1cm depth; 0.314cm^2 area and 0.031cm^3 volume. The wound is limited to skin breakdown. There is no tunneling or undermining noted. There is a medium amount of serous drainage noted. The wound margin is flat and intact. There is small (1-33%) pink granulation within the wound bed. There is a medium (34-66%) amount of necrotic tissue within the wound bed including Adherent Slough. The periwound skin appearance exhibited: Moist. The periwound skin appearance did  not exhibit: Callus, Crepitus, Excoriation, Fluctuance, Friable, Induration, Localized Edema, Rash, Scarring, Dry/Scaly, Maceration, Atrophie Blanche, Cyanosis, Ecchymosis, Hemosiderin Staining, Mottled, Pallor, Rubor, Erythema. Lydia, [name] ([id number]) Assessment Active Problems ICD-10 L89.152 - Pressure ulcer of sacral region, stage 2 E44.0 - Moderate protein-calorie malnutrition F17.218 - Nicotine dependence, cigarettes, with other nicotine-induced disorders D50.9 - Iron deficiency anemia, unspecified 82 year old patient who is fairly ambivalent but significantly underweight and has protein calorie malnutrition. To treat her pressure injury on the right sacral area I have recommended: 1. Appropriate and constant offloading of this area and have discussed this in detail with her 2. Aquacel Ag with a bordered foam to be applied and changed every other day 3. Completely giving up smoking and have spent over 3 minutes discussing the risks benefits alternatives and all the problems with wound healing 4. Adequate protein intake and vitamin supplements including vitamin A, vitamin C and zinc 5. Regular visits to the wound care center. Plan Wound Cleansing: Wound #1 Medial Sacrum: Clean wound with Normal Saline. May Shower, gently pat wound dry prior to applying new dressing. Anesthetic: Wound #1 Medial Sacrum: Topical Lidocaine 4% cream applied to wound bed prior to debridement Primary Wound Dressing: Wound #1 Medial Sacrum: Aquacel Ag Secondary Dressing: Wound #1 Medial Sacrum: Boardered Foam Dressing Dressing Change Frequency: Wound #1 Medial Sacrum: Change dressing every other day. Follow-up Appointments: Wound #1 Medial Sacrum:  Return Appointment in 1 week. Lydia, RIDDER. ([id number]) Off-Loading: Wound #1 Medial Sacrum: Turn and reposition every 2 hours Additional Orders / Instructions: Wound #1 Medial Sacrum: Stop Smoking - Follow-up with PCP for smoking cessation  prescription. Increase protein intake. Activity as tolerated Medications-please add to medication list.: Other: - VItamin A; Vitamin C and Zinc 82 year old patient who is fairly ambivalent but significantly underweight and has protein calorie malnutrition. To treat her pressure injury on the right sacral area I have recommended: 1. Appropriate and constant offloading of this area and have discussed this in detail with her 2. Aquacel Ag with a bordered foam to be applied and changed every other day 3. Completely giving up smoking and have spent over 3 minutes discussing the risks benefits alternatives and all the problems with wound healing 4. Adequate protein intake and vitamin supplements including vitamin A, vitamin C and zinc 5. Regular visits to the wound care center. Electronic Signature(s) Signed: 05/07/2016 4:06:56 PM By: Christin Fudge MD, FACS Previous Signature: 05/07/2016 8:53:04 AM Version By: Christin Fudge MD, FACS Entered By: Christin Fudge on 05/07/2016 16:06:56 Lydia [name] ([id number]) -------------------------------------------------------------------------------- ROS/PFSH Details Patient Name: Lydia [name] Date of Service: 05/07/2016 8:00 AM Medical Record Number: [id number] Patient Account Number: [id number] Date of Birth/Sex: May 06, 1927 (82 y.o. Female) Treating RN: Cornell Barman Primary Care Physician: Paulita Cradle Other Clinician: Referring Physician: Gaylyn Cheers Treating Physician/Extender: Frann Rider in Treatment: 0 Information Obtained From Patient Caregiver Wound History Do you currently have one or more open woundso Yes How many open wounds do you currently haveo 1 Approximately how long have you had your woundso 2 years How have you been treating your wound(s) until nowo zinc Has your wound(s) ever healed and then re-openedo No Have you had any lab work done in the past montho Yes Have you tested positive for an antibiotic resistant  organism (MRSA, VRE)o No Have you tested positive for osteomyelitis (bone infection)o No Have you had any tests for circulation on your legso No Have you had other problems associated with your woundso Infection Integumentary (Skin) Complaints and Symptoms: Positive for: Wounds; Bleeding or bruising tendency Negative for: Breakdown; Swelling Medical History: Negative for: History of Burn; History of pressure wounds Constitutional Symptoms (General Health) Complaints and Symptoms: No Complaints or Symptoms Eyes Complaints and Symptoms: No Complaints or Symptoms Medical History: Positive for: Cataracts - removed Negative for: Glaucoma Ear/Nose/Mouth/Throat Complaints and Symptoms: No Complaints or Symptoms Lydia [name], [name] ([id number]) Medical History: Negative for: Chronic sinus problems/congestion; Middle ear problems Hematologic/Lymphatic Complaints and Symptoms: No Complaints or Symptoms Medical History: Positive for: Anemia - iron infusion this week Negative for: Hemophilia; Human Immunodeficiency Virus; Lymphedema; Sickle Cell Disease Respiratory Complaints and Symptoms: No Complaints or Symptoms Medical History: Positive for: Chronic Obstructive Pulmonary Disease (COPD) Negative for: Aspiration; Asthma; Pneumothorax; Sleep Apnea Cardiovascular Complaints and Symptoms: No Complaints or Symptoms Medical History: Positive for: Hypertension - medication controlled Negative for: Angina; Arrhythmia; Congestive Heart Failure; Coronary Artery Disease; Deep Vein Thrombosis; Hypotension; Myocardial Infarction; Peripheral Arterial Disease; Peripheral Venous Disease; Phlebitis; Vasculitis Gastrointestinal Complaints and Symptoms: No Complaints or Symptoms Medical History: Negative for: Cirrhosis ; Colitis; Crohnos; Hepatitis A; Hepatitis B; Hepatitis C Endocrine Complaints and Symptoms: No Complaints or Symptoms Medical History: Negative for: Type I Diabetes; Type II  Diabetes Genitourinary Complaints and Symptoms: No Complaints or Symptoms Lydia [name], [name] ([id number]) Medical History: Negative for: End Stage Renal Disease Immunological Complaints and Symptoms: No Complaints or Symptoms Medical History: Negative for: Lupus  Erythematosus; Raynaudos; Scleroderma Musculoskeletal Complaints and Symptoms: No Complaints or Symptoms Medical History: Negative for: Gout; Rheumatoid Arthritis; Osteoarthritis; Osteomyelitis Neurologic Complaints and Symptoms: No Complaints or Symptoms Medical History: Negative for: Dementia; Neuropathy; Quadriplegia; Paraplegia; Seizure Disorder Oncologic Complaints and Symptoms: No Complaints or Symptoms Medical History: Negative for: Received Chemotherapy; Received Radiation Psychiatric Complaints and Symptoms: No Complaints or Symptoms Medical History: Negative for: Anorexia/bulimia; Confinement Anxiety HBO Extended History Items Eyes: Cataracts Family and Social History Cancer: Yes - Siblings; Diabetes: No; Heart Disease: No; Hypertension: No; Kidney Disease: No; Lung Disease: No; Seizures: No; Stroke: No; Thyroid Problems: No; Current every day smoker - pack daily; Lydia [name], Lydia [name] ([id number]) Marital Status - Married; Alcohol Use: Never; Drug Use: No History; Caffeine Use: Daily; Advanced Directives: Yes (Not Provided); Patient does not want information on Advanced Directives; Living Will: Yes (Not Provided) Physician Affirmation I have reviewed and agree with the above information. Electronic Signature(s) Signed: 05/07/2016 8:32:32 AM By: Christin Fudge MD, FACS Signed: 05/07/2016 5:50:20 PM By: Gretta Cool RN, BSN, Kim RN, BSN Entered By: Christin Fudge on 05/07/2016 08:32:32 Lydia [name], SANDIFER ([id number]) -------------------------------------------------------------------------------- SuperBill Details Patient Name: Lydia [name] Date of Service: 05/07/2016 Medical Record Number: [id number] Patient  Account Number: [id number] Date of Birth/Sex: [birth year] (82 y.o. Female) Treating RN: Cornell Barman Primary Care Physician: Paulita Cradle Other Clinician: Referring Physician: Gaylyn Cheers Treating Physician/Extender: Frann Rider in Treatment: 0 Diagnosis Coding ICD-10 Codes Code Description L89.152 Pressure ulcer of sacral region, stage 2 E44.0 Moderate protein-calorie malnutrition F17.218 Nicotine dependence, cigarettes, with other nicotine-induced disorders D50.9 Iron deficiency anemia, unspecified Facility Procedures CPT4 Code: [id number] Description: 99213 - WOUND CARE VISIT-LEV 3 EST PT Modifier: Quantity: 1 Physician Procedures CPT4 Code Description: [id number] - WC PHYS LEVEL 4 - EST PT ICD-10 Description Diagnosis L89.152 Pressure ulcer of sacral region, stage 2 E44.0 Moderate protein-calorie malnutrition F17.218 Nicotine dependence, cigarettes, with other nicot D50.9 Iron  deficiency anemia, unspecified Modifier: ine-induced di Quantity: 1 sorders Electronic Signature(s) Signed: 05/07/2016 8:54:54 AM By: Christin Fudge MD, FACS Entered By: Christin Fudge on 05/07/2016 08:54:54

## 2016-05-14 ENCOUNTER — Encounter: Payer: Medicare HMO | Admitting: Surgery

## 2016-05-14 DIAGNOSIS — L89152 Pressure ulcer of sacral region, stage 2: Secondary | ICD-10-CM | POA: Diagnosis not present

## 2016-05-15 ENCOUNTER — Inpatient Hospital Stay: Payer: Medicare HMO

## 2016-05-15 ENCOUNTER — Inpatient Hospital Stay (HOSPITAL_BASED_OUTPATIENT_CLINIC_OR_DEPARTMENT_OTHER): Payer: Medicare HMO | Admitting: Internal Medicine

## 2016-05-15 VITALS — BP 162/92 | HR 62 | Temp 96.6°F | Resp 18 | Wt 86.2 lb

## 2016-05-15 DIAGNOSIS — M810 Age-related osteoporosis without current pathological fracture: Secondary | ICD-10-CM

## 2016-05-15 DIAGNOSIS — R05 Cough: Secondary | ICD-10-CM

## 2016-05-15 DIAGNOSIS — M549 Dorsalgia, unspecified: Secondary | ICD-10-CM

## 2016-05-15 DIAGNOSIS — G8929 Other chronic pain: Secondary | ICD-10-CM

## 2016-05-15 DIAGNOSIS — I10 Essential (primary) hypertension: Secondary | ICD-10-CM

## 2016-05-15 DIAGNOSIS — K921 Melena: Secondary | ICD-10-CM

## 2016-05-15 DIAGNOSIS — D508 Other iron deficiency anemias: Secondary | ICD-10-CM

## 2016-05-15 DIAGNOSIS — F17210 Nicotine dependence, cigarettes, uncomplicated: Secondary | ICD-10-CM

## 2016-05-15 DIAGNOSIS — D509 Iron deficiency anemia, unspecified: Secondary | ICD-10-CM

## 2016-05-15 DIAGNOSIS — D751 Secondary polycythemia: Secondary | ICD-10-CM

## 2016-05-15 DIAGNOSIS — R5383 Other fatigue: Secondary | ICD-10-CM

## 2016-05-15 DIAGNOSIS — J449 Chronic obstructive pulmonary disease, unspecified: Secondary | ICD-10-CM

## 2016-05-15 DIAGNOSIS — R634 Abnormal weight loss: Secondary | ICD-10-CM

## 2016-05-15 DIAGNOSIS — D500 Iron deficiency anemia secondary to blood loss (chronic): Secondary | ICD-10-CM

## 2016-05-15 DIAGNOSIS — Z79899 Other long term (current) drug therapy: Secondary | ICD-10-CM

## 2016-05-15 LAB — CBC WITH DIFFERENTIAL/PLATELET
BASOS ABS: 0 10*3/uL (ref 0–0.1)
BASOS PCT: 0 %
EOS ABS: 0 10*3/uL (ref 0–0.7)
EOS PCT: 0 %
HCT: 43 % (ref 35.0–47.0)
Hemoglobin: 13.8 g/dL (ref 12.0–16.0)
Lymphocytes Relative: 9 %
Lymphs Abs: 0.5 10*3/uL — ABNORMAL LOW (ref 1.0–3.6)
MCH: 25.3 pg — AB (ref 26.0–34.0)
MCHC: 32.2 g/dL (ref 32.0–36.0)
MCV: 78.7 fL — ABNORMAL LOW (ref 80.0–100.0)
Monocytes Absolute: 0.4 10*3/uL (ref 0.2–0.9)
Monocytes Relative: 7 %
Neutro Abs: 4.6 10*3/uL (ref 1.4–6.5)
Neutrophils Relative %: 84 %
PLATELETS: 287 10*3/uL (ref 150–440)
RBC: 5.46 MIL/uL — AB (ref 3.80–5.20)
RDW: 35.7 % — ABNORMAL HIGH (ref 11.5–14.5)
WBC: 5.5 10*3/uL (ref 3.6–11.0)

## 2016-05-15 MED ORDER — SODIUM CHLORIDE 0.9 % IV SOLN
510.0000 mg | Freq: Once | INTRAVENOUS | Status: AC
  Administered 2016-05-15: 510 mg via INTRAVENOUS
  Filled 2016-05-15: qty 17

## 2016-05-15 MED ORDER — SODIUM CHLORIDE 0.9 % IV SOLN
Freq: Once | INTRAVENOUS | Status: AC
  Administered 2016-05-15: 15:00:00 via INTRAVENOUS
  Filled 2016-05-15: qty 1000

## 2016-05-15 NOTE — Progress Notes (Signed)
[facility] OFFICE PROGRESS NOTE  Patient Care Team: Marinda Elk, MD as PCP - General (Physician Assistant)   SUMMARY OF ONCOLOGIC HISTORY:  # IDA  [Colonoscopy last Nov 2015- hem pos stool- incomplete; Dr. Elliot]; April 2017- IV ferrhemx 28 Apr 2016- CT- Neg for malignancy;Thyroid nodule [Dr.McQueen].   # POLYCYTHEMIA SECONDARY s/p Phlebotomy; none at least 2015  INTERVAL HISTORY:  A pleasant 82 year old female patient with history of iron deficiency anemia is here for follow-up. Patient had IV Feraheme 2 approximately 1 month ago. She feels slightly improved post IV iron.  She also had a CT scan of the chest and pelvis- no clear reason for weight loss.  Continues to have fatigue. Continues to have poor appetite. Shortness of breath on exertion.  Patient has had mild weight loss the last many months; patient is chronic cough; she smokes. No headaches or vision changes. Chronic back pain.  REVIEW OF SYSTEMS:  A complete 10 point review of system is done which is negative except mentioned above/history of present illness.   PAST MEDICAL HISTORY :  Past Medical History  Diagnosis Date  . Hypertension   . Osteoporosis   . Lower back pain   . History of stomach ulcers     40 yrs ago  . Polycythemia   . COPD (chronic obstructive pulmonary disease) ([place])   . IDA (iron deficiency anemia)     PAST SURGICAL HISTORY :   Past Surgical History  Procedure Laterality Date  . Blepharoplasty    . Stomach surgery  1995    for ulcers  . Colonoscopy    . Cataract extraction w/phaco Right 01/27/2016    Procedure: CATARACT EXTRACTION PHACO AND INTRAOCULAR LENS PLACEMENT (IOC);  Surgeon: Ronnell Freshwater, MD;  Location: [facility];  Service: Ophthalmology;  Laterality: Right;  . Colonoscopy  2005, 2010    FAMILY HISTORY :   Family History  Problem Relation Age of Onset  . Breast cancer Sister     SOCIAL HISTORY:   Social History  Substance  Use Topics  . Smoking status: Current Every Day Smoker -- 1.00 packs/day for 65 years    Types: Cigarettes  . Smokeless tobacco: Never Used  . Alcohol Use: No    ALLERGIES:  is allergic to penicillins.  MEDICATIONS:  Current Outpatient Prescriptions  Medication Sig Dispense Refill  . amLODipine (NORVASC) 5 MG tablet Take 5 mg by mouth daily. AM    . CALCIUM PO Take by mouth daily. AM    . Cholecalciferol (VITAMIN D PO) Take by mouth daily. AM    . metoprolol (LOPRESSOR) 100 MG tablet Take 100 mg by mouth daily. AM    . omeprazole (PRILOSEC) 20 MG capsule      No current facility-administered medications for this visit.    PHYSICAL EXAMINATION:   BP 162/92 mmHg  Pulse 62  Temp(Src) 96.6 F (35.9 C) (Tympanic)  Resp 18  Wt 86 lb 3.2 oz (39.1 kg)  Filed Weights   05/15/16 1410  Weight: 86 lb 3.2 oz (39.1 kg)    GENERAL: Thin appearing moderately nourished Caucasian female patient Alert, no distress and comfortable.  EYES: no pallor or icterus OROPHARYNX: no thrush or ulceration; NECK: supple, no masses felt LYMPH:  no palpable lymphadenopathy in the cervical, axillary or inguinal regions LUNGS: Barrel Shaped chest ; decreased air entry bilaterally. No wheeze or crackles HEART/CVS: regular rate & rhythm and no murmurs; No lower extremity edema ABDOMEN:abdomen soft, non-tender and  normal bowel sounds Musculoskeletal:no cyanosis of digits and no clubbing kyphosis/scoliosis noted  PSYCH: alert & oriented x 3 with fluent speech NEURO: no focal motor/sensory deficits SKIN:  no rashes or significant lesions  LABORATORY DATA:  I have reviewed the data as listed    Component Value Date/Time   NA 136 04/16/2016 1222   NA 140 11/08/2014 1341   K 3.9 04/16/2016 1222   K 3.7 11/08/2014 1341   CL 100* 04/16/2016 1222   CL 103 11/08/2014 1341   CO2 32 04/16/2016 1222   CO2 31 11/08/2014 1341   GLUCOSE 70 04/16/2016 1222   GLUCOSE 126* 11/08/2014 1341   BUN 26* 04/16/2016  1222   BUN 13 11/08/2014 1341   CREATININE 0.73 04/16/2016 1222   CREATININE 0.79 11/08/2014 1341   CALCIUM 8.4* 04/16/2016 1222   CALCIUM 7.8* 11/08/2014 1341   PROT 6.5 04/16/2016 1222   PROT 6.3* 11/08/2014 1341   ALBUMIN 3.8 04/16/2016 1222   ALBUMIN 3.1* 11/08/2014 1341   AST 28 04/16/2016 1222   AST 27 11/08/2014 1341   ALT 24 04/16/2016 1222   ALT 23 11/08/2014 1341   ALKPHOS 54 04/16/2016 1222   ALKPHOS 63 11/08/2014 1341   BILITOT 0.4 04/16/2016 1222   BILITOT 0.3 11/08/2014 1341   GFRNONAA >60 04/16/2016 1222   GFRNONAA >60 11/08/2014 1341   GFRNONAA >60 02/03/2013 1047   GFRAA >60 04/16/2016 1222   GFRAA >60 11/08/2014 1341   GFRAA >60 02/03/2013 1047    No results found for: SPEP, UPEP  Lab Results  Component Value Date   WBC 5.5 05/15/2016   NEUTROABS 4.6 05/15/2016   HGB 13.8 05/15/2016   HCT 43.0 05/15/2016   MCV 78.7* 05/15/2016   PLT 287 05/15/2016      Chemistry      Component Value Date/Time   NA 136 04/16/2016 1222   NA 140 11/08/2014 1341   K 3.9 04/16/2016 1222   K 3.7 11/08/2014 1341   CL 100* 04/16/2016 1222   CL 103 11/08/2014 1341   CO2 32 04/16/2016 1222   CO2 31 11/08/2014 1341   BUN 26* 04/16/2016 1222   BUN 13 11/08/2014 1341   CREATININE 0.73 04/16/2016 1222   CREATININE 0.79 11/08/2014 1341      Component Value Date/Time   CALCIUM 8.4* 04/16/2016 1222   CALCIUM 7.8* 11/08/2014 1341   ALKPHOS 54 04/16/2016 1222   ALKPHOS 63 11/08/2014 1341   AST 28 04/16/2016 1222   AST 27 11/08/2014 1341   ALT 24 04/16/2016 1222   ALT 23 11/08/2014 1341   BILITOT 0.4 04/16/2016 1222   BILITOT 0.3 11/08/2014 1341        ASSESSMENT & PLAN:   # Iron deficiency anemia- recent hemoglobin with PCP in March 2017- 9.3 MCV 68. Likely GI blood loss positive-stool occult blood. Status post IV iron 2- hemoglobin today is 13. MCV still 76. Recent CT scan negative for any malignancy.recommend IV iron today.  # reviewed with the patient that  she will need GI evaluation. I spoke to Dr. Vira Agar- given her age/comorbidities would recommend  Her following up in this clinic for discussion.I as the patient to give [place] a call for an appointment.  # Chronic smoker- patient not interested in smoking cessation.   # Follow-up in 4 months with IV iron infusion/M.D. Visit; labs 1 week prior.     [name], MD 05/15/2016 2:14 PM

## 2016-05-15 NOTE — Progress Notes (Signed)
SHARLOTTE, FLAM ([id number]) Visit Report for 05/14/2016 Chief Complaint Document Details Patient Name: Lydia [name], Lydia [name] 05/14/2016 10:00 Date of Service: AM Medical Record [id number] Number: Patient Account Number: [id number] [birth year] (82 y.o. Treating RN: Cornell Barman Date of Birth/Sex: Female) Other Clinician: Primary Care Physician: Carrie Mew, MIRIAM Treating Christin Fudge Referring Physician: Paulita Cradle Physician/Extender: Suella Grove in Treatment: 1 Information Obtained from: Patient Chief Complaint Patient is at the clinic for treatment of an open pressure ulcer to the sacral region which she's had for about 2 years Electronic Signature(s) Signed: 05/14/2016 10:37:39 AM By: Christin Fudge MD, FACS Entered By: Christin Fudge on 05/14/2016 10:37:38 [name] ([id number]) -------------------------------------------------------------------------------- HPI Details Patient Name: Lydia [name] 05/14/2016 10:00 Date of Service: AM Medical Record [id number] Number: Patient Account Number: [id number] [birth year] (82 y.o. Treating RN: Cornell Barman Date of Birth/Sex: Female) Other Clinician: Primary Care Physician: Carrie Mew, MIRIAM Treating Christin Fudge Referring Physician: Paulita Cradle Physician/Extender: Suella Grove in Treatment: 1 History of Present Illness Location: pressure injury to the right of the midline in the sacral region for about 2 years Quality: Patient reports experiencing a dull pain to affected area(s). Severity: Patient states wound are getting worse. Duration: Patient has had the wound for > 2 years prior to seeking treatment at the wound center Timing: Pain in wound is Intermittent (comes and goes Context: The wound would happen gradually Modifying Factors: Other treatment(s) tried include:zinc oxide paste has been applied to this Associated Signs and Symptoms: Patient reports having difficulty standing for long periods. HPI Description:  82 year old patient with iron deficiency anemia has a past medical history significant for hypertension, osteoporosis,duodenal ulcers, COPD, status post gastric surgery for ulcers, colonoscopy. she is a smoker and smokes about a pack of cigarettes a day for about 65 years. most recent lab work done showed WBC count of 6.6 hemoglobin of 10.8 hematocrit of 35 and platelets of 323 and her BMP was within normal limits. A venous duplex study on the right lower extremity done in November 2015 showed no venous reflux and no evidence of DVT. she was seen by her PCP recently and he found a stage II sacral decubitus ulcer and referred her to others. 05/14/2016 -- I don't believe she's been able to do her dressings well and she continues to smoke. Electronic Signature(s) Signed: 05/14/2016 10:37:57 AM By: Christin Fudge MD, FACS Entered By: Christin Fudge on 05/14/2016 10:37:57 [name] ([id number]) -------------------------------------------------------------------------------- Physical Exam Details Patient Name: Lydia [name] 05/14/2016 10:00 Date of Service: AM Medical Record [id number] Number: Patient Account Number: [id number] [birth year] (82 y.o. Treating RN: Cornell Barman Date of Birth/Sex: Female) Other Clinician: Primary Care Physician: Carrie Mew, MIRIAM Treating Christin Fudge Referring Physician: Paulita Cradle Physician/Extender: Weeks in Treatment: 1 Constitutional . Pulse regular. Respirations normal and unlabored. Afebrile. . Eyes Nonicteric. Reactive to light. Ears, Nose, Mouth, and Throat Lips, teeth, and gums WNL.Marland Kitchen Moist mucosa without lesions. Neck supple and nontender. No palpable supraclavicular or cervical adenopathy. Normal sized without goiter. Respiratory WNL. No retractions.. Cardiovascular Pedal Pulses WNL. No clubbing, cyanosis or edema. Gastrointestinal (GI) Abdomen without masses or tenderness.. No liver or spleen enlargement or  tenderness.. Lymphatic No adneopathy. No adenopathy. No adenopathy. Musculoskeletal Adexa without tenderness or enlargement.. Digits and nails w/o clubbing, cyanosis, infection, petechiae, ischemia, or inflammatory conditions.. Integumentary (Hair, Skin) No suspicious lesions. No crepitus or fluctuance. No peri-wound warmth or erythema. No masses.Marland Kitchen Psychiatric Judgement and insight Intact.. No evidence of depression, anxiety, or agitation.. Notes the pressure ulcer on the right sacral  area continues to have healthy granulation tissue and there is no evidence of any further progression of the disease. Electronic Signature(s) Signed: 05/14/2016 10:38:32 AM By: Christin Fudge MD, FACS Entered By: Christin Fudge on 05/14/2016 10:38:31 [name] ([id number]) -------------------------------------------------------------------------------- Physician Orders Details Patient Name: Lydia [name] 05/14/2016 10:00 Date of Service: AM Medical Record [id number] Number: Patient Account Number: [id number] July 03, 1927 (82 y.o. Treating RN: Cornell Barman Date of Birth/Sex: Female) Other Clinician: Primary Care Physician: Carrie Mew, MIRIAM Treating Christin Fudge Referring Physician: Paulita Cradle Physician/Extender: Suella Grove in Treatment: 1 Verbal / Phone Orders: Yes Clinician: Cornell Barman Read Back and Verified: Yes Diagnosis Coding Wound Cleansing Wound #1 Medial Sacrum o Clean wound with Normal Saline. o May Shower, gently pat wound dry prior to applying new dressing. Anesthetic Wound #1 Medial Sacrum o Topical Lidocaine 4% cream applied to wound bed prior to debridement Primary Wound Dressing Wound #1 Medial Sacrum o Aquacel Ag Secondary Dressing Wound #1 Medial Sacrum o Boardered Foam Dressing Dressing Change Frequency Wound #1 Medial Sacrum o Change dressing every other day. Follow-up Appointments Wound #1 Medial Sacrum o Return Appointment in 1  week. Off-Loading Wound #1 Medial Sacrum o Turn and reposition every 2 hours Additional Orders / Instructions Wound #1 Medial Sacrum o Stop Smoking - Follow-up with PCP for smoking cessation prescription. o Increase protein intake. ZELAH, WYLAND ([id number]) o Activity as tolerated Medications-please add to medication list. o Other: - VItamin A; Vitamin C and Zinc Electronic Signature(s) Signed: 05/14/2016 3:55:58 PM By: Christin Fudge MD, FACS Signed: 05/15/2016 9:30:44 AM By: Gretta Cool, RN, BSN, Kim RN, BSN Entered By: Gretta Cool, RN, BSN, Kim on 05/14/2016 10:13:57 DARLETTA, TYNDALE ([id number]) -------------------------------------------------------------------------------- Problem List Details Patient Name: LANA, PUERTA 05/14/2016 10:00 Date of Service: AM Medical Record [id number] Number: Patient Account Number: [id number] 1927-06-28 (82 y.o. Treating RN: Cornell Barman Date of Birth/Sex: Female) Other Clinician: Primary Care Physician: Carrie Mew, MIRIAM Treating Christin Fudge Referring Physician: Paulita Cradle Physician/Extender: Suella Grove in Treatment: 1 Active Problems ICD-10 Encounter Code Description Active Date Diagnosis L89.152 Pressure ulcer of sacral region, stage 2 05/07/2016 Yes E44.0 Moderate protein-calorie malnutrition 05/07/2016 Yes F17.218 Nicotine dependence, cigarettes, with other nicotine- 05/07/2016 Yes induced disorders D50.9 Iron deficiency anemia, unspecified 05/07/2016 Yes Inactive Problems Resolved Problems Electronic Signature(s) Signed: 05/14/2016 10:37:33 AM By: Christin Fudge MD, FACS Entered By: Christin Fudge on 05/14/2016 10:37:32 [name] ([id number]) -------------------------------------------------------------------------------- Progress Note Details Patient Name: Lydia [name] 05/14/2016 10:00 Date of Service: AM Medical Record [id number] Number: Patient Account Number: [id number] 1926/12/30 (82 y.o. Treating RN: Cornell Barman Date of Birth/Sex: Female) Other Clinician: Primary Care Physician: Carrie Mew, MIRIAM Treating Reona Zendejas Referring Physician: Paulita Cradle Physician/Extender: Suella Grove in Treatment: 1 Subjective Chief Complaint Information obtained from Patient Patient is at the clinic for treatment of an open pressure ulcer to the sacral region which she's had for about 2 years History of Present Illness (HPI) The following HPI elements were documented for the patient's wound: Location: pressure injury to the right of the midline in the sacral region for about 2 years Quality: Patient reports experiencing a dull pain to affected area(s). Severity: Patient states wound are getting worse. Duration: Patient has had the wound for > 2 years prior to seeking treatment at the wound center Timing: Pain in wound is Intermittent (comes and goes Context: The wound would happen gradually Modifying Factors: Other treatment(s) tried include:zinc oxide paste has been applied to this Associated Signs and Symptoms: Patient reports having difficulty standing for long  periods. 82 year old patient with iron deficiency anemia has a past medical history significant for hypertension, osteoporosis,duodenal ulcers, COPD, status post gastric surgery for ulcers, colonoscopy. she is a smoker and smokes about a pack of cigarettes a day for about 65 years. most recent lab work done showed WBC count of 6.6 hemoglobin of 10.8 hematocrit of 35 and platelets of 323 and her BMP was within normal limits. A venous duplex study on the right lower extremity done in November 2015 showed no venous reflux and no evidence of DVT. she was seen by her PCP recently and he found a stage II sacral decubitus ulcer and referred her to others. 05/14/2016 -- I don't believe she's been able to do her dressings well and she continues to smoke. Objective Constitutional Pulse regular. Respirations normal and unlabored. Afebrile. NIKIRA, WILLETS. ([id number]) Vitals Time Taken: 9:57 AM, Height: 66 in, Weight: 88.5 lbs, BMI: 14.3, Temperature: 97.5 F, Pulse: 92 bpm, Respiratory Rate: 18 breaths/min, Blood Pressure: 153/81 mmHg. General Notes: Patient states she has not taken her blood pressure medication this morning. She states she forgot. She will take it when she gets home. She will follow-up with PCP if needed. Eyes Nonicteric. Reactive to light. Ears, Nose, Mouth, and Throat Lips, teeth, and gums WNL.Marland Kitchen Moist mucosa without lesions. Neck supple and nontender. No palpable supraclavicular or cervical adenopathy. Normal sized without goiter. Respiratory WNL. No retractions.. Cardiovascular Pedal Pulses WNL. No clubbing, cyanosis or edema. Gastrointestinal (GI) Abdomen without masses or tenderness.. No liver or spleen enlargement or tenderness.. Lymphatic No adneopathy. No adenopathy. No adenopathy. Musculoskeletal Adexa without tenderness or enlargement.. Digits and nails w/o clubbing, cyanosis, infection, petechiae, ischemia, or inflammatory conditions.Marland Kitchen Psychiatric Judgement and insight Intact.. No evidence of depression, anxiety, or agitation.. General Notes: the pressure ulcer on the right sacral area continues to have healthy granulation tissue and there is no evidence of any further progression of the disease. Integumentary (Hair, Skin) No suspicious lesions. No crepitus or fluctuance. No peri-wound warmth or erythema. No masses.. Wound #1 status is Open. Original cause of wound was Pressure Injury. The wound is located on the Medial Sacrum. The wound measures 0.8cm length x 0.6cm width x 0.1cm depth; 0.377cm^2 area and 0.038cm^3 volume. The wound is limited to skin breakdown. There is no tunneling or undermining noted. There is a medium amount of serous drainage noted. The wound margin is flat and intact. There is small (1-33%) pink granulation within the wound bed. There is a medium (34-66%) amount of  necrotic tissue within the wound bed including Adherent Slough. The periwound skin appearance exhibited: Moist. The periwound skin appearance did not exhibit: Callus, Crepitus, Excoriation, Fluctuance, Friable, Induration, Localized Edema, Rash, Scarring, Dry/Scaly, Maceration, Atrophie Blanche, Cyanosis, Ecchymosis, Blasdell, Farryn D. ([id number]) Hemosiderin Staining, Mottled, Pallor, Rubor, Erythema. Assessment Active Problems ICD-10 L89.152 - Pressure ulcer of sacral region, stage 2 E44.0 - Moderate protein-calorie malnutrition F17.218 - Nicotine dependence, cigarettes, with other nicotine-induced disorders D50.9 - Iron deficiency anemia, unspecified Plan Wound Cleansing: Wound #1 Medial Sacrum: Clean wound with Normal Saline. May Shower, gently pat wound dry prior to applying new dressing. Anesthetic: Wound #1 Medial Sacrum: Topical Lidocaine 4% cream applied to wound bed prior to debridement Primary Wound Dressing: Wound #1 Medial Sacrum: Aquacel Ag Secondary Dressing: Wound #1 Medial Sacrum: Boardered Foam Dressing Dressing Change Frequency: Wound #1 Medial Sacrum: Change dressing every other day. Follow-up Appointments: Wound #1 Medial Sacrum: Return Appointment in 1 week. Off-Loading: Wound #1 Medial Sacrum: Turn and reposition every  2 hours Additional Orders / Instructions: Wound #1 Medial Sacrum: Stop Smoking - Follow-up with PCP for smoking cessation prescription. Increase protein intake. Activity as tolerated Medications-please add to medication list.: NYLIE, SORENSON. ([id number]) Other: - VItamin A; Vitamin C and Zinc I have recommended: 1. Appropriate and constant offloading of this area and have discussed this in detail with her 2. Aquacel Ag with a bordered foam to be applied and changed every other day 3. Completely giving up smoking and have spent over 3 minutes discussing the risks benefits alternatives and all the problems with wound healing 4.  Adequate protein intake and vitamin supplements including vitamin A, vitamin C and zinc 5. Regular visits to the wound care center. Electronic Signature(s) Signed: 05/14/2016 10:39:05 AM By: Christin Fudge MD, FACS Entered By: Christin Fudge on 05/14/2016 10:39:05 [name] ([id number]) -------------------------------------------------------------------------------- SuperBill Details Patient Name: Lydia [name] Date of Service: 05/14/2016 Medical Record Number: [id number] Patient Account Number: [id number] Date of Birth/Sex: 04-05-1927 (82 y.o. Female) Treating RN: Cornell Barman Primary Care Physician: Paulita Cradle Other Clinician: Referring Physician: Paulita Cradle Treating Physician/Extender: Frann Rider in Treatment: 1 Diagnosis Coding ICD-10 Codes Code Description [phone] Pressure ulcer of sacral region, stage 2 E44.0 Moderate protein-calorie malnutrition F17.218 Nicotine dependence, cigarettes, with other nicotine-induced disorders D50.9 Iron deficiency anemia, unspecified Facility Procedures CPT4 Code: [id number] Description: [phone] - WOUND CARE VISIT-LEV 2 EST PT Modifier: Quantity: 1 Physician Procedures CPT4 Code Description: [id number] - WC PHYS LEVEL 3 - EST PT ICD-10 Description Diagnosis L89.152 Pressure ulcer of sacral region, stage 2 E44.0 Moderate protein-calorie malnutrition F17.218 Nicotine dependence, cigarettes, with other nicot D50.9 Iron  deficiency anemia, unspecified Modifier: ine-induced di Quantity: 1 sorders Electronic Signature(s) Signed: 05/14/2016 10:39:19 AM By: Christin Fudge MD, FACS Entered By: Christin Fudge on 05/14/2016 10:39:19

## 2016-05-15 NOTE — Progress Notes (Signed)
Lydia, [name] ([id number]) Visit Report for 05/14/2016 Arrival Information Details Patient Name: Lydia [name], Lydia [name]. Date of Service: 05/14/2016 10:00 AM Medical Record Number: [id number] Patient Account Number: [id number] Date of Birth/Sex: [birth year] (82 y.o. Female) Treating RN: Cornell Barman Primary Care Physician: Paulita Cradle Other Clinician: Referring Physician: Paulita Cradle Treating Physician/Extender: Frann Rider in Treatment: 1 Visit Information History Since Last Visit Added or deleted any medications: No Patient Arrived: Ambulatory Any new allergies or adverse reactions: No Arrival Time: 09:57 Had a fall or experienced change in No Accompanied By: self activities of daily living that may affect Transfer Assistance: None risk of falls: Patient Identification Verified: Yes Signs or symptoms of abuse/neglect since last No Secondary Verification Process Yes visito Completed: Hospitalized since last visit: No Patient Requires Transmission-Based No Has Dressing in Place as Prescribed: Yes Precautions: Pain Present Now: No Patient Has Alerts: No Electronic Signature(s) Signed: 05/15/2016 9:30:44 AM By: Gretta Cool, RN, BSN, Kim RN, BSN Entered By: Gretta Cool, RN, BSN, Kim on 05/14/2016 09:57:51 Lanpher, Kerri-Anne DMarland Kitchen ([id number]) -------------------------------------------------------------------------------- Clinic Level of Care Assessment Details Patient Name: Lydia [name] Date of Service: 05/14/2016 10:00 AM Medical Record Number: [id number] Patient Account Number: [id number] Date of Birth/Sex: 28-Apr-1927 (82 y.o. Female) Treating RN: Cornell Barman Primary Care Physician: Paulita Cradle Other Clinician: Referring Physician: Paulita Cradle Treating Physician/Extender: Frann Rider in Treatment: 1 Clinic Level of Care Assessment Items TOOL 4 Quantity Score []  - Use when only an EandM is performed on FOLLOW-UP visit 0 ASSESSMENTS - Nursing Assessment /  Reassessment []  - Reassessment of Co-morbidities (includes updates in patient status) 0 X - Reassessment of Adherence to Treatment Plan 1 5 ASSESSMENTS - Wound and Skin Assessment / Reassessment X - Simple Wound Assessment / Reassessment - one wound 1 5 []  - Complex Wound Assessment / Reassessment - multiple wounds 0 []  - Dermatologic / Skin Assessment (not related to wound area) 0 ASSESSMENTS - Focused Assessment []  - Circumferential Edema Measurements - multi extremities 0 []  - Nutritional Assessment / Counseling / Intervention 0 []  - Lower Extremity Assessment (monofilament, tuning fork, pulses) 0 []  - Peripheral Arterial Disease Assessment (using hand held doppler) 0 ASSESSMENTS - Ostomy and/or Continence Assessment and Care []  - Incontinence Assessment and Management 0 []  - Ostomy Care Assessment and Management (repouching, etc.) 0 PROCESS - Coordination of Care X - Simple Patient / Family Education for ongoing care 1 15 []  - Complex (extensive) Patient / Family Education for ongoing care 0 []  - Staff obtains Programmer, systems, Records, Test Results / Process Orders 0 []  - Staff telephones HHA, Nursing Homes / Clarify orders / etc 0 []  - Routine Transfer to another Facility (non-emergent condition) 0 Joy, [name] ([id number]) []  - Routine Hospital Admission (non-emergent condition) 0 []  - New Admissions / Biomedical engineer / Ordering NPWT, Apligraf, etc. 0 []  - Emergency Hospital Admission (emergent condition) 0 X - Simple Discharge Coordination 1 10 []  - Complex (extensive) Discharge Coordination 0 PROCESS - Special Needs []  - Pediatric / Minor Patient Management 0 []  - Isolation Patient Management 0 []  - Hearing / Language / Visual special needs 0 []  - Assessment of Community assistance (transportation, D/C planning, etc.) 0 []  - Additional assistance / Altered mentation 0 []  - Support Surface(s) Assessment (bed, cushion, seat, etc.) 0 INTERVENTIONS - Wound Cleansing /  Measurement X - Simple Wound Cleansing - one wound 1 5 []  - Complex Wound Cleansing - multiple wounds 0 X - Wound Imaging (photographs - any number of wounds)  1 5 []  - Wound Tracing (instead of photographs) 0 X - Simple Wound Measurement - one wound 1 5 []  - Complex Wound Measurement - multiple wounds 0 INTERVENTIONS - Wound Dressings X - Small Wound Dressing one or multiple wounds 1 10 []  - Medium Wound Dressing one or multiple wounds 0 []  - Large Wound Dressing one or multiple wounds 0 []  - Application of Medications - topical 0 []  - Application of Medications - injection 0 INTERVENTIONS - Miscellaneous []  - External ear exam 0 Pacella, [name] ([id number]) []  - Specimen Collection (cultures, biopsies, blood, body fluids, etc.) 0 []  - Specimen(s) / Culture(s) sent or taken to Lab for analysis 0 []  - Patient Transfer (multiple staff / Harrel Lemon Lift / Similar devices) 0 []  - Simple Staple / Suture removal (25 or less) 0 []  - Complex Staple / Suture removal (26 or more) 0 []  - Hypo / Hyperglycemic Management (close monitor of Blood Glucose) 0 []  - Ankle / Brachial Index (ABI) - do not check if billed separately 0 X - Vital Signs 1 5 Has the patient been seen at the hospital within the last three years: Yes Total Score: 65 Level Of Care: New/Established - Level 2 Electronic Signature(s) Signed: 05/15/2016 9:30:44 AM By: Gretta Cool, RN, BSN, Kim RN, BSN Entered By: Gretta Cool, RN, BSN, Kim on 05/14/2016 10:15:11 Lydia [name] ([id number]) -------------------------------------------------------------------------------- Encounter Discharge Information Details Patient Name: Lydia [name] Date of Service: 05/14/2016 10:00 AM Medical Record Number: [id number] Patient Account Number: [id number] Date of Birth/Sex: 11-13-1927 (82 y.o. Female) Treating RN: Cornell Barman Primary Care Physician: Paulita Cradle Other Clinician: Referring Physician: Paulita Cradle Treating Physician/Extender:  Frann Rider in Treatment: 1 Encounter Discharge Information Items Discharge Pain Level: 0 Discharge Condition: Stable Ambulatory Status: Ambulatory Discharge Destination: Home Transportation: Private Auto Accompanied By: self Schedule Follow-up Appointment: Yes Medication Reconciliation completed and provided to Patient/Care Yes Amari Burnsworth: Provided on Clinical Summary of Care: 05/14/2016 Form Type Recipient Paper Patient [facility] Electronic Signature(s) Signed: 05/15/2016 9:30:44 AM By: Gretta Cool RN, BSN, Kim RN, BSN Previous Signature: 05/14/2016 10:15:32 AM Version By: Ruthine Dose Entered By: Gretta Cool RN, BSN, Kim on 05/14/2016 10:15:49 [place], Jacqulyne D. ([id number]) -------------------------------------------------------------------------------- Multi Wound Chart Details Patient Name: Lydia [name] Date of Service: 05/14/2016 10:00 AM Medical Record Number: [id number] Patient Account Number: [id number] Date of Birth/Sex: 1927/02/22 (82 y.o. Female) Treating RN: Cornell Barman Primary Care Physician: Paulita Cradle Other Clinician: Referring Physician: Paulita Cradle Treating Physician/Extender: Frann Rider in Treatment: 1 Vital Signs Height(in): 66 Pulse(bpm): 92 Weight(lbs): 88.5 Blood Pressure 153/81 (mmHg): Body Mass Index(BMI): 14 Temperature(F): 97.5 Respiratory Rate 18 (breaths/min): Photos: [1:No Photos] [N/A:N/A] Wound Location: [1:Sacrum - Medial] [N/A:N/A] Wounding Event: [1:Pressure Injury] [N/A:N/A] Primary Etiology: [1:Pressure Ulcer] [N/A:N/A] Comorbid History: [1:Cataracts, Anemia, Chronic Obstructive Pulmonary Disease (COPD), Hypertension] [N/A:N/A] Date Acquired: [1:04/27/2014] [N/A:N/A] Weeks of Treatment: [1:1] [N/A:N/A] Wound Status: [1:Open] [N/A:N/A] Measurements L x W x D 0.8x0.6x0.1 [N/A:N/A] (cm) Area (cm) : [1:0.377] [N/A:N/A] Volume (cm) : [1:0.038] [N/A:N/A] % Reduction in Area: [1:-20.10%] [N/A:N/A] % Reduction in  Volume: -22.60% [N/A:N/A] Classification: [1:Category/Stage II] [N/A:N/A] Exudate Amount: [1:Medium] [N/A:N/A] Exudate Type: [1:Serous] [N/A:N/A] Exudate Color: [1:amber] [N/A:N/A] Wound Margin: [1:Flat and Intact] [N/A:N/A] Granulation Amount: [1:Small (1-33%)] [N/A:N/A] Granulation Quality: [1:Pink] [N/A:N/A] Necrotic Amount: [1:Medium (34-66%)] [N/A:N/A] Exposed Structures: [1:Fascia: No Fat: No Tendon: No Muscle: No Joint: No] [N/A:N/A] Bone: No Limited to Skin Breakdown Epithelialization: None N/A N/A Periwound Skin Texture: Edema: No N/A N/A Excoriation: No Induration: No Callus: No Crepitus: No  Fluctuance: No Friable: No Rash: No Scarring: No Periwound Skin Moist: Yes N/A N/A Moisture: Maceration: No Dry/Scaly: No Periwound Skin Color: Atrophie Blanche: No N/A N/A Cyanosis: No Ecchymosis: No Erythema: No Hemosiderin Staining: No Mottled: No Pallor: No Rubor: No Tenderness on No N/A N/A Palpation: Wound Preparation: Ulcer Cleansing: N/A N/A Rinsed/Irrigated with Saline Topical Anesthetic Applied: Other: lidocaine 4% Treatment Notes Electronic Signature(s) Signed: 05/15/2016 9:30:44 AM By: Gretta Cool, RN, BSN, Kim RN, BSN Entered By: Gretta Cool, RN, BSN, Kim on 05/14/2016 10:02:57 Lydia [name] ([id number]) -------------------------------------------------------------------------------- [place] Details Patient Name: Lydia [name] Date of Service: 05/14/2016 10:00 AM Medical Record Number: [id number] Patient Account Number: [id number] Date of Birth/Sex: Jul 14, 1927 (82 y.o. Female) Treating RN: Cornell Barman Primary Care Physician: Paulita Cradle Other Clinician: Referring Physician: Paulita Cradle Treating Physician/Extender: Frann Rider in Treatment: 1 Active Inactive Abuse / Safety / Falls / Self Care Management Nursing Diagnoses: Potential for falls Goals: Patient will remain injury free Date Initiated:  05/07/2016 Goal Status: Active Interventions: Assess self care needs on admission and as needed Notes: Nutrition Nursing Diagnoses: Imbalanced nutrition Goals: Patient/caregiver agrees to and verbalizes understanding of need to use nutritional supplements and/or vitamins as prescribed Date Initiated: 05/07/2016 Goal Status: Active Interventions: Assess patient nutrition upon admission and as needed per policy Notes: Orientation to the Wound Care Program Nursing Diagnoses: Knowledge deficit related to the wound healing center program Goals: Patient/caregiver will verbalize understanding of the [place], [place] D. ([id number]) Date Initiated: 05/07/2016 Goal Status: Active Interventions: Provide education on orientation to the wound center Notes: Wound/Skin Impairment Nursing Diagnoses: Impaired tissue integrity Goals: Ulcer/skin breakdown will heal within 14 weeks Date Initiated: 05/07/2016 Goal Status: Active Interventions: Assess ulceration(s) every visit Notes: Electronic Signature(s) Signed: 05/15/2016 9:30:44 AM By: Gretta Cool, RN, BSN, Kim RN, BSN Entered By: Gretta Cool, RN, BSN, Kim on 05/14/2016 10:02:51 Frisina, Dexter D. ([id number]) -------------------------------------------------------------------------------- Pain Assessment Details Patient Name: Lydia [name] Date of Service: 05/14/2016 10:00 AM Medical Record Number: [id number] Patient Account Number: [id number] Date of Birth/Sex: June 16, 1927 (82 y.o. Female) Treating RN: Cornell Barman Primary Care Physician: Paulita Cradle Other Clinician: Referring Physician: Paulita Cradle Treating Physician/Extender: Frann Rider in Treatment: 1 Active Problems Location of Pain Severity and Description of Pain Patient Has Paino No Site Locations Pain Management and Medication Current Pain Management: Electronic Signature(s) Signed: 05/15/2016 9:30:44 AM By: Gretta Cool, RN, BSN, Kim RN,  BSN Entered By: Gretta Cool, RN, BSN, Kim on 05/14/2016 09:57:56 Laser, Toni Arthurs ([id number]) -------------------------------------------------------------------------------- Patient/Caregiver Education Details Patient Name: Lydia [name] Date of Service: 05/14/2016 10:00 AM Medical Record Number: [id number] Patient Account Number: [id number] Date of Birth/Gender: [birth year] (82 y.o. Female) Treating RN: Cornell Barman Primary Care Physician: Paulita Cradle Other Clinician: Referring Physician: Paulita Cradle Treating Physician/Extender: Frann Rider in Treatment: 1 Education Assessment Education Provided To: Patient Education Topics Provided Wound/Skin Impairment: Caring for Your Ulcer, Other: make sure Aquacel AG is next to skin under bordered foam Handouts: dressing. Methods: Demonstration, Explain/Verbal Responses: State content correctly Electronic Signature(s) Signed: 05/15/2016 9:30:44 AM By: Gretta Cool, RN, BSN, Kim RN, BSN Entered By: Gretta Cool, RN, BSN, Kim on 05/14/2016 10:16:23 Lydia [name] ([id number]) -------------------------------------------------------------------------------- Wound Assessment Details Patient Name: Lydia [name] Date of Service: 05/14/2016 10:00 AM Medical Record Number: [id number] Patient Account Number: [id number] Date of Birth/Sex: [birth year] (82 y.o. Female) Treating RN: Cornell Barman Primary Care Physician: Paulita Cradle Other Clinician: Referring Physician: Paulita Cradle Treating Physician/Extender: Frann Rider in Treatment: 1 Wound Status Wound  Number: 1 Primary Pressure Ulcer Etiology: Wound Location: Sacrum - Medial Wound Open Wounding Event: Pressure Injury Status: Date Acquired: 04/27/2014 Comorbid Cataracts, Anemia, Chronic Weeks Of Treatment: 1 History: Obstructive Pulmonary Disease Clustered Wound: No (COPD), Hypertension Photos Photo Uploaded By: Gretta Cool, RN, BSN, Kim on 05/14/2016 10:03:57 Wound  Measurements Length: (cm) 0.8 Width: (cm) 0.6 Depth: (cm) 0.1 Area: (cm) 0.377 Volume: (cm) 0.038 % Reduction in Area: -20.1% % Reduction in Volume: -22.6% Epithelialization: None Tunneling: No Undermining: No Wound Description Classification: Category/Stage II Wound Margin: Flat and Intact Exudate Amount: Medium Exudate Type: Serous Exudate Color: amber Wound Bed Granulation Amount: Small (1-33%) Exposed Structure Granulation Quality: Pink Fascia Exposed: No Necrotic Amount: Medium (34-66%) Fat Layer Exposed: No Necrotic Quality: Adherent Slough Tendon Exposed: No [place], Jasime D. ([id number]) Muscle Exposed: No Joint Exposed: No Bone Exposed: No Limited to Skin Breakdown Periwound Skin Texture Texture Color No Abnormalities Noted: No No Abnormalities Noted: No Callus: No Atrophie Blanche: No Crepitus: No Cyanosis: No Excoriation: No Ecchymosis: No Fluctuance: No Erythema: No Friable: No Hemosiderin Staining: No Induration: No Mottled: No Localized Edema: No Pallor: No Rash: No Rubor: No Scarring: No Moisture No Abnormalities Noted: No Dry / Scaly: No Maceration: No Moist: Yes Wound Preparation Ulcer Cleansing: Rinsed/Irrigated with Saline Topical Anesthetic Applied: Other: lidocaine 4%, Treatment Notes Wound #1 (Medial Sacrum) 1. Cleansed with: Clean wound with Normal Saline 2. Anesthetic Topical Lidocaine 4% cream to wound bed prior to debridement 4. Dressing Applied: Aquacel Ag 5. Secondary Dressing Applied Bordered Foam Dressing Notes VItamin A; Vitamin C and Zinc Electronic Signature(s) Signed: 05/15/2016 9:30:44 AM By: Gretta Cool, RN, BSN, Kim RN, BSN Entered By: Gretta Cool, RN, BSN, Kim on 05/14/2016 10:02:43 Lydia [name] ([id number]) -------------------------------------------------------------------------------- [place] Details Patient Name: Lydia [name] Date of Service: 05/14/2016 10:00 AM Medical Record Number:  [id number] Patient Account Number: [id number] Date of Birth/Sex: 1927/08/18 (82 y.o. Female) Treating RN: Cornell Barman Primary Care Physician: Paulita Cradle Other Clinician: Referring Physician: Paulita Cradle Treating Physician/Extender: Frann Rider in Treatment: 1 Vital Signs Time Taken: 09:57 Temperature (F): 97.5 Height (in): 66 Pulse (bpm): 92 Weight (lbs): 88.5 Respiratory Rate (breaths/min): 18 Body Mass Index (BMI): 14.3 Blood Pressure (mmHg): 153/81 Reference Range: 80 - 120 mg / dl Notes Patient states she has not taken her blood pressure medication this morning. She states she forgot. She will take it when she gets home. She will follow-up with PCP if needed. Electronic Signature(s) Signed: 05/15/2016 9:30:44 AM By: Gretta Cool, RN, BSN, Kim RN, BSN Entered By: Gretta Cool, RN, BSN, Kim on 05/14/2016 09:59:33

## 2016-05-21 ENCOUNTER — Encounter: Payer: Medicare HMO | Admitting: Surgery

## 2016-05-21 DIAGNOSIS — L89152 Pressure ulcer of sacral region, stage 2: Secondary | ICD-10-CM | POA: Diagnosis not present

## 2016-05-21 NOTE — Progress Notes (Addendum)
ALEXY, HOOFNAGLE ([id number]) Visit Report for 05/21/2016 Arrival Information Details Patient Name: Lydia [name], Lydia [name]. Date of Service: 05/21/2016 12:45 PM Medical Record Number: [id number] Patient Account Number: [id number] Date of Birth/Sex: 10/12/1927 (82 y.o. Female) Treating RN: Cornell Barman Primary Care Physician: Paulita Cradle Other Clinician: Referring Physician: Paulita Cradle Treating Physician/Extender: Frann Rider in Treatment: 2 Visit Information History Since Last Visit Added or deleted any medications: No Patient Arrived: Ambulatory Any new allergies or adverse reactions: No Arrival Time: 12:53 Had a fall or experienced change in No Accompanied By: self activities of daily living that may affect Transfer Assistance: None risk of falls: Patient Identification Verified: Yes Signs or symptoms of abuse/neglect since last No Secondary Verification Process Yes visito Completed: Hospitalized since last visit: No Patient Requires Transmission-Based No Has Dressing in Place as Prescribed: Yes Precautions: Pain Present Now: No Patient Has Alerts: No Electronic Signature(s) Signed: 05/21/2016 1:16:05 PM By: Gretta Cool, RN, BSN, Kim RN, BSN Entered By: Gretta Cool, RN, BSN, Kim on 05/21/2016 12:54:13 Shiley, Lydia [name] ([id number]) -------------------------------------------------------------------------------- Clinic Level of Care Assessment Details Patient Name: Lydia [name] Date of Service: 05/21/2016 12:45 PM Medical Record Number: [id number] Patient Account Number: [id number] Date of Birth/Sex: [birth year] (82 y.o. Female) Treating RN: Cornell Barman Primary Care Physician: Paulita Cradle Other Clinician: Referring Physician: Paulita Cradle Treating Physician/Extender: Frann Rider in Treatment: 2 Clinic Level of Care Assessment Items TOOL 4 Quantity Score []  - Use when only an EandM is performed on FOLLOW-UP visit 0 ASSESSMENTS - Nursing Assessment /  Reassessment []  - Reassessment of Co-morbidities (includes updates in patient status) 0 X - Reassessment of Adherence to Treatment Plan 1 5 ASSESSMENTS - Wound and Skin Assessment / Reassessment X - Simple Wound Assessment / Reassessment - one wound 1 5 []  - Complex Wound Assessment / Reassessment - multiple wounds 0 []  - Dermatologic / Skin Assessment (not related to wound area) 0 ASSESSMENTS - Focused Assessment []  - Circumferential Edema Measurements - multi extremities 0 []  - Nutritional Assessment / Counseling / Intervention 0 []  - Lower Extremity Assessment (monofilament, tuning fork, pulses) 0 []  - Peripheral Arterial Disease Assessment (using hand held doppler) 0 ASSESSMENTS - Ostomy and/or Continence Assessment and Care []  - Incontinence Assessment and Management 0 []  - Ostomy Care Assessment and Management (repouching, etc.) 0 PROCESS - Coordination of Care X - Simple Patient / Family Education for ongoing care 1 15 []  - Complex (extensive) Patient / Family Education for ongoing care 0 []  - Staff obtains Programmer, systems, Records, Test Results / Process Orders 0 []  - Staff telephones HHA, Nursing Homes / Clarify orders / etc 0 []  - Routine Transfer to another Facility (non-emergent condition) 0 Canupp, Ronan D. ([id number]) []  - Routine Hospital Admission (non-emergent condition) 0 []  - New Admissions / Biomedical engineer / Ordering NPWT, Apligraf, etc. 0 []  - Emergency Hospital Admission (emergent condition) 0 X - Simple Discharge Coordination 1 10 []  - Complex (extensive) Discharge Coordination 0 PROCESS - Special Needs []  - Pediatric / Minor Patient Management 0 []  - Isolation Patient Management 0 []  - Hearing / Language / Visual special needs 0 []  - Assessment of Community assistance (transportation, D/C planning, etc.) 0 []  - Additional assistance / Altered mentation 0 []  - Support Surface(s) Assessment (bed, cushion, seat, etc.) 0 INTERVENTIONS - Wound Cleansing /  Measurement X - Simple Wound Cleansing - one wound 1 5 []  - Complex Wound Cleansing - multiple wounds 0 X - Wound Imaging (photographs - any number of wounds)  1 5 []  - Wound Tracing (instead of photographs) 0 X - Simple Wound Measurement - one wound 1 5 []  - Complex Wound Measurement - multiple wounds 0 INTERVENTIONS - Wound Dressings X - Small Wound Dressing one or multiple wounds 1 10 []  - Medium Wound Dressing one or multiple wounds 0 []  - Large Wound Dressing one or multiple wounds 0 []  - Application of Medications - topical 0 []  - Application of Medications - injection 0 INTERVENTIONS - Miscellaneous []  - External ear exam 0 Mottern, Malaiya D. ([id number]) []  - Specimen Collection (cultures, biopsies, blood, body fluids, etc.) 0 []  - Specimen(s) / Culture(s) sent or taken to Lab for analysis 0 []  - Patient Transfer (multiple staff / Harrel Lemon Lift / Similar devices) 0 []  - Simple Staple / Suture removal (25 or less) 0 []  - Complex Staple / Suture removal (26 or more) 0 []  - Hypo / Hyperglycemic Management (close monitor of Blood Glucose) 0 []  - Ankle / Brachial Index (ABI) - do not check if billed separately 0 X - Vital Signs 1 5 Has the patient been seen at the hospital within the last three years: Yes Total Score: 65 Level Of Care: New/Established - Level 2 Electronic Signature(s) Signed: 05/21/2016 1:16:05 PM By: Gretta Cool, RN, BSN, Kim RN, BSN Entered By: Gretta Cool, RN, BSN, Kim on 05/21/2016 13:06:06 Jewkes, Lydia [name] ([id number]) -------------------------------------------------------------------------------- Encounter Discharge Information Details Patient Name: Lydia [name] Date of Service: 05/21/2016 12:45 PM Medical Record Number: [id number] Patient Account Number: [id number] Date of Birth/Sex: 09/22/1927 (82 y.o. Female) Treating RN: Cornell Barman Primary Care Physician: Paulita Cradle Other Clinician: Referring Physician: Paulita Cradle Treating Physician/Extender:  Frann Rider in Treatment: 2 Encounter Discharge Information Items Discharge Pain Level: 0 Discharge Condition: Stable Ambulatory Status: Ambulatory Discharge Destination: Home Transportation: Private Auto Accompanied By: self Schedule Follow-up Appointment: Yes Medication Reconciliation completed and provided to Patient/Care Yes Patriece Archbold: Provided on Clinical Summary of Care: 05/21/2016 Form Type Recipient Paper Patient [facility] Electronic Signature(s) Signed: 05/21/2016 1:07:08 PM By: Ruthine Dose Entered By: Ruthine Dose on 05/21/2016 13:07:08 Carol, Shikita D. ([id number]) -------------------------------------------------------------------------------- Multi Wound Chart Details Patient Name: Lydia [name] Date of Service: 05/21/2016 12:45 PM Medical Record Number: [id number] Patient Account Number: [id number] Date of Birth/Sex: 16-Nov-1927 (82 y.o. Female) Treating RN: Cornell Barman Primary Care Physician: Paulita Cradle Other Clinician: Referring Physician: Paulita Cradle Treating Physician/Extender: Frann Rider in Treatment: 2 Vital Signs Height(in): 66 Pulse(bpm): 78 Weight(lbs): 88.5 Blood Pressure 146/70 (mmHg): Body Mass Index(BMI): 14 Temperature(F): Respiratory Rate 18 (breaths/min): Photos: [1:No Photos] [N/A:N/A] Wound Location: [1:Sacrum - Medial] [N/A:N/A] Wounding Event: [1:Pressure Injury] [N/A:N/A] Primary Etiology: [1:Pressure Ulcer] [N/A:N/A] Comorbid History: [1:Cataracts, Anemia, Chronic Obstructive Pulmonary Disease (COPD), Hypertension] [N/A:N/A] Date Acquired: [1:04/27/2014] [N/A:N/A] Weeks of Treatment: [1:2] [N/A:N/A] Wound Status: [1:Open] [N/A:N/A] Measurements L x W x D 0.5x0.3x0.1 [N/A:N/A] (cm) Area (cm) : [1:0.118] [N/A:N/A] Volume (cm) : [1:0.012] [N/A:N/A] % Reduction in Area: [1:62.40%] [N/A:N/A] % Reduction in Volume: 61.30% [N/A:N/A] Classification: [1:Category/Stage II] [N/A:N/A] Exudate Amount:  [1:Medium] [N/A:N/A] Exudate Type: [1:Serous] [N/A:N/A] Exudate Color: [1:amber] [N/A:N/A] Wound Margin: [1:Flat and Intact] [N/A:N/A] Granulation Amount: [1:Large (67-100%)] [N/A:N/A] Granulation Quality: [1:Pink] [N/A:N/A] Necrotic Amount: [1:Small (1-33%)] [N/A:N/A] Exposed Structures: [1:Fascia: No Fat: No Tendon: No Muscle: No Joint: No] [N/A:N/A] Bone: No Limited to Skin Breakdown Epithelialization: None N/A N/A Periwound Skin Texture: Edema: No N/A N/A Excoriation: No Induration: No Callus: No Crepitus: No Fluctuance: No Friable: No Rash: No Scarring: No Periwound Skin Moist: Yes N/A N/A Moisture: Maceration:  No Dry/Scaly: No Periwound Skin Color: Atrophie Blanche: No N/A N/A Cyanosis: No Ecchymosis: No Erythema: No Hemosiderin Staining: No Mottled: No Pallor: No Rubor: No Tenderness on No N/A N/A Palpation: Wound Preparation: Ulcer Cleansing: N/A N/A Rinsed/Irrigated with Saline Topical Anesthetic Applied: None Treatment Notes Electronic Signature(s) Signed: 05/21/2016 1:16:05 PM By: Gretta Cool, RN, BSN, Kim RN, BSN Entered By: Gretta Cool, RN, BSN, Kim on 05/21/2016 12:58:57 Shimizu, Lydia [name] ([id number]) -------------------------------------------------------------------------------- [place] Details Patient Name: Lydia [name] Date of Service: 05/21/2016 12:45 PM Medical Record Number: [id number] Patient Account Number: [id number] Date of Birth/Sex: 05-30-1927 (82 y.o. Female) Treating RN: Cornell Barman Primary Care Physician: Paulita Cradle Other Clinician: Referring Physician: Paulita Cradle Treating Physician/Extender: Frann Rider in Treatment: 2 Active Inactive Abuse / Safety / Falls / Self Care Management Nursing Diagnoses: Potential for falls Goals: Patient will remain injury free Date Initiated: 05/07/2016 Goal Status: Active Interventions: Assess self care needs on admission and as  needed Notes: Nutrition Nursing Diagnoses: Imbalanced nutrition Goals: Patient/caregiver agrees to and verbalizes understanding of need to use nutritional supplements and/or vitamins as prescribed Date Initiated: 05/07/2016 Goal Status: Active Interventions: Assess patient nutrition upon admission and as needed per policy Notes: Orientation to the Wound Care Program Nursing Diagnoses: Knowledge deficit related to the wound healing center program Goals: Patient/caregiver will verbalize understanding of the [place], [place] D. ([id number]) Date Initiated: 05/07/2016 Goal Status: Active Interventions: Provide education on orientation to the wound center Notes: Wound/Skin Impairment Nursing Diagnoses: Impaired tissue integrity Goals: Ulcer/skin breakdown will heal within 14 weeks Date Initiated: 05/07/2016 Goal Status: Active Interventions: Assess ulceration(s) every visit Notes: Electronic Signature(s) Signed: 05/21/2016 1:16:05 PM By: Gretta Cool, RN, BSN, Kim RN, BSN Entered By: Gretta Cool, RN, BSN, Kim on 05/21/2016 12:58:50 Corral, Ivan D. ([id number]) -------------------------------------------------------------------------------- Pain Assessment Details Patient Name: Lydia [name] Date of Service: 05/21/2016 12:45 PM Medical Record Number: [id number] Patient Account Number: [id number] Date of Birth/Sex: 1927/02/17 (82 y.o. Female) Treating RN: Cornell Barman Primary Care Physician: Paulita Cradle Other Clinician: Referring Physician: Paulita Cradle Treating Physician/Extender: Frann Rider in Treatment: 2 Active Problems Location of Pain Severity and Description of Pain Patient Has Paino No Site Locations With Dressing Change: No Pain Management and Medication Current Pain Management: Electronic Signature(s) Signed: 05/21/2016 1:16:05 PM By: Gretta Cool, RN, BSN, Kim RN, BSN Entered By: Gretta Cool, RN, BSN, Kim on 05/21/2016 12:54:39 [name] ([id number]) -------------------------------------------------------------------------------- Patient/Caregiver Education Details Patient Name: Lydia [name] Date of Service: 05/21/2016 12:45 PM Medical Record Number: [id number] Patient Account Number: [id number] Date of Birth/Gender: 1927-08-31 (82 y.o. Female) Treating RN: Cornell Barman Primary Care Physician: Paulita Cradle Other Clinician: Referring Physician: Paulita Cradle Treating Physician/Extender: Frann Rider in Treatment: 2 Education Assessment Education Provided To: Patient Education Topics Provided Pressure: Handouts: Preventing Pressure Ulcers, Other: keep moving; turn as often as possible Methods: Demonstration, Explain/Verbal Responses: State content correctly Welcome To [facility] [place]: Electronic Signature(s) Signed: 05/21/2016 1:16:05 PM By: Gretta Cool, RN, BSN, Kim RN, BSN Entered By: Gretta Cool, RN, BSN, Kim on 05/21/2016 13:07:29 Yoo, Lydia [name] ([id number]) -------------------------------------------------------------------------------- Wound Assessment Details Patient Name: Lydia [name] Date of Service: 05/21/2016 12:45 PM Medical Record Number: [id number] Patient Account Number: [id number] Date of Birth/Sex: 07/27/1927 (82 y.o. Female) Treating RN: Cornell Barman Primary Care Physician: Paulita Cradle Other Clinician: Referring Physician: Paulita Cradle Treating Physician/Extender: Frann Rider in Treatment: 2 Wound Status Wound Number: 1 Primary Pressure Ulcer Etiology: Wound Location: Sacrum - Medial Wound Open Wounding Event:  Pressure Injury Status: Date Acquired: 04/27/2014 Comorbid Cataracts, Anemia, Chronic Weeks Of Treatment: 2 History: Obstructive Pulmonary Disease Clustered Wound: No (COPD), Hypertension Photos Photo Uploaded By: Gretta Cool, RN, BSN, Kim on 05/21/2016 13:09:34 Wound Measurements Length: (cm) 0.5 Width: (cm) 0.3 Depth: (cm) 0.1 Area:  (cm) 0.118 Volume: (cm) 0.012 % Reduction in Area: 62.4% % Reduction in Volume: 61.3% Epithelialization: None Wound Description Classification: Category/Stage II Wound Margin: Flat and Intact Exudate Amount: Medium Exudate Type: Serous Exudate Color: amber Wound Bed Granulation Amount: Large (67-100%) Exposed Structure Granulation Quality: Pink Fascia Exposed: No Necrotic Amount: Small (1-33%) Fat Layer Exposed: No Necrotic Quality: Adherent Slough Tendon Exposed: No Loving, Jayce D. ([id number]) Muscle Exposed: No Joint Exposed: No Bone Exposed: No Limited to Skin Breakdown Periwound Skin Texture Texture Color No Abnormalities Noted: No No Abnormalities Noted: No Callus: No Atrophie Blanche: No Crepitus: No Cyanosis: No Excoriation: No Ecchymosis: No Fluctuance: No Erythema: No Friable: No Hemosiderin Staining: No Induration: No Mottled: No Localized Edema: No Pallor: No Rash: No Rubor: No Scarring: No Moisture No Abnormalities Noted: No Dry / Scaly: No Maceration: No Moist: Yes Wound Preparation Ulcer Cleansing: Rinsed/Irrigated with Saline Topical Anesthetic Applied: None Treatment Notes Wound #1 (Medial Sacrum) 1. Cleansed with: Clean wound with Normal Saline 4. Dressing Applied: Aquacel Ag 5. Secondary Dressing Applied Bordered Foam Dressing Notes VItamin A; Vitamin C and Zinc Electronic Signature(s) Signed: 05/21/2016 1:16:05 PM By: Gretta Cool, RN, BSN, Kim RN, BSN Entered By: Gretta Cool, RN, BSN, Kim on 05/21/2016 12:58:13 Gratz, Lydia [name] ([id number]) -------------------------------------------------------------------------------- [place] Details Patient Name: Lydia [name] Date of Service: 05/21/2016 12:45 PM Medical Record Number: [id number] Patient Account Number: [id number] Date of Birth/Sex: Nov 02, 1927 (82 y.o. Female) Treating RN: Cornell Barman Primary Care Physician: Paulita Cradle Other Clinician: Referring Physician: Paulita Cradle Treating Physician/Extender: Frann Rider in Treatment: 2 Vital Signs Time Taken: 12:54 Pulse (bpm): 78 Height (in): 66 Respiratory Rate (breaths/min): 18 Weight (lbs): 88.5 Blood Pressure (mmHg): 146/70 Body Mass Index (BMI): 14.3 Reference Range: 80 - 120 mg / dl Electronic Signature(s) Signed: 05/21/2016 1:16:05 PM By: Gretta Cool, RN, BSN, Kim RN, BSN Entered By: Gretta Cool, RN, BSN, Kim on 05/21/2016 12:54:56

## 2016-05-21 NOTE — Progress Notes (Signed)
NAMIRA, HENNESY ([id number]) Visit Report for 05/21/2016 Chief Complaint Document Details Patient Name: Lydia [name], Lydia [name] 05/21/2016 12:45 Date of Service: PM Medical Record [id number] Number: Patient Account Number: [id number] 23-Oct-1927 (82 y.o. Treating RN: Cornell Barman Date of Birth/Sex: Female) Other Clinician: Primary Care Physician: Carrie Mew, MIRIAM Treating Christin Fudge Referring Physician: Paulita Cradle Physician/Extender: Suella Grove in Treatment: 2 Information Obtained from: Patient Chief Complaint Patient is at the clinic for treatment of an open pressure ulcer to the sacral region which she's had for about 2 years Electronic Signature(s) Signed: 05/21/2016 1:21:16 PM By: Christin Fudge MD, FACS Entered By: Christin Fudge on 05/21/2016 13:21:16 Lydia [name] ([id number]) -------------------------------------------------------------------------------- HPI Details Patient Name: Lydia [name] 05/21/2016 12:45 Date of Service: PM Medical Record [id number] Number: Patient Account Number: [id number] 09-05-1927 (82 y.o. Treating RN: Cornell Barman Date of Birth/Sex: Female) Other Clinician: Primary Care Physician: Carrie Mew, MIRIAM Treating Christin Fudge Referring Physician: Paulita Cradle Physician/Extender: Suella Grove in Treatment: 2 History of Present Illness Location: pressure injury to the right of the midline in the sacral region for about 2 years Quality: Patient reports experiencing a dull pain to affected area(s). Severity: Patient states wound are getting worse. Duration: Patient has had the wound for > 2 years prior to seeking treatment at the wound center Timing: Pain in wound is Intermittent (comes and goes Context: The wound would happen gradually Modifying Factors: Other treatment(s) tried include:zinc oxide paste has been applied to this Associated Signs and Symptoms: Patient reports having difficulty standing for long periods. HPI Description: 82 year old  patient with iron deficiency anemia has a past medical history significant for hypertension, osteoporosis,duodenal ulcers, COPD, status post gastric surgery for ulcers, colonoscopy. she is a smoker and smokes about a pack of cigarettes a day for about 65 years. most recent lab work done showed WBC count of 6.6 hemoglobin of 10.8 hematocrit of 35 and platelets of 323 and her BMP was within normal limits. A venous duplex study on the right lower extremity done in November 2015 showed no venous reflux and no evidence of DVT. she was seen by her PCP recently and he found a stage II sacral decubitus ulcer and referred her to others. 05/14/2016 -- I don't believe she's been able to do her dressings well and she continues to smoke. Electronic Signature(s) Signed: 05/21/2016 1:21:21 PM By: Christin Fudge MD, FACS Entered By: Christin Fudge on 05/21/2016 13:21:21 Lydia [name] ([id number]) -------------------------------------------------------------------------------- Physical Exam Details Patient Name: Lydia [name], Lydia [name] 05/21/2016 12:45 Date of Service: PM Medical Record [id number] Number: Patient Account Number: [id number] 09-Jun-1927 (82 y.o. Treating RN: Cornell Barman Date of Birth/Sex: Female) Other Clinician: Primary Care Physician: Carrie Mew, MIRIAM Treating Christin Fudge Referring Physician: Paulita Cradle Physician/Extender: Weeks in Treatment: 2 Constitutional . Pulse regular. Respirations normal and unlabored. Afebrile. . Eyes Nonicteric. Reactive to light. Ears, Nose, Mouth, and Throat Lips, teeth, and gums WNL.Marland Kitchen Moist mucosa without lesions. Neck supple and nontender. No palpable supraclavicular or cervical adenopathy. Normal sized without goiter. Respiratory WNL. No retractions.. Breath sounds WNL, No rubs, rales, rhonchi, or wheeze.. Cardiovascular Heart rhythm and rate regular, no murmur or gallop.. Pedal Pulses WNL. No clubbing, cyanosis or edema. Lymphatic No  adneopathy. No adenopathy. No adenopathy. Musculoskeletal Adexa without tenderness or enlargement.. Digits and nails w/o clubbing, cyanosis, infection, petechiae, ischemia, or inflammatory conditions.. Integumentary (Hair, Skin) No suspicious lesions. No crepitus or fluctuance. No peri-wound warmth or erythema. No masses.Marland Kitchen Psychiatric Judgement and insight Intact.. No evidence of depression, anxiety, or agitation.. Notes the wound is  looking much better today and she has been more compliant with the dressings and no debridement was required. Electronic Signature(s) Signed: 05/21/2016 1:21:43 PM By: Christin Fudge MD, FACS Entered By: Christin Fudge on 05/21/2016 13:21:43 Lydia [name] ([id number]) -------------------------------------------------------------------------------- Physician Orders Details Patient Name: Lydia [name], Lydia [name] 05/21/2016 12:45 Date of Service: PM Medical Record [id number] Number: Patient Account Number: [id number] Jun 25, 1927 (82 y.o. Treating RN: Cornell Barman Date of Birth/Sex: Female) Other Clinician: Primary Care Physician: Carrie Mew, MIRIAM Treating October Peery Referring Physician: Paulita Cradle Physician/Extender: Suella Grove in Treatment: 2 Verbal / Phone Orders: Yes Clinician: Cornell Barman Read Back and Verified: Yes Diagnosis Coding Wound Cleansing Wound #1 Medial Sacrum o Clean wound with Normal Saline. o May Shower, gently pat wound dry prior to applying new dressing. Anesthetic Wound #1 Medial Sacrum o Topical Lidocaine 4% cream applied to wound bed prior to debridement Primary Wound Dressing Wound #1 Medial Sacrum o Aquacel Ag Secondary Dressing Wound #1 Medial Sacrum o Boardered Foam Dressing Dressing Change Frequency Wound #1 Medial Sacrum o Change dressing every other day. Follow-up Appointments Wound #1 Medial Sacrum o Return Appointment in 1 week. Off-Loading Wound #1 Medial Sacrum o Turn and reposition every 2  hours Additional Orders / Instructions Wound #1 Medial Sacrum o Stop Smoking - Follow-up with PCP for smoking cessation prescription. o Increase protein intake. Lydia [name], Lydia [name] ([id number]) o Activity as tolerated Medications-please add to medication list. o Other: - VItamin A; Vitamin C and Zinc Services and Therapies o Smoking Cessation Electronic Signature(s) Signed: 05/21/2016 1:16:05 PM By: Gretta Cool RN, BSN, Kim RN, BSN Signed: 05/21/2016 4:02:17 PM By: Christin Fudge MD, FACS Entered By: Gretta Cool, RN, BSN, Kim on 05/21/2016 13:03:57 Lydia [name], Lydia [name] ([id number]) -------------------------------------------------------------------------------- Problem List Details Patient Name: Lydia [name], Lydia [name] 05/21/2016 12:45 Date of Service: PM Medical Record [id number] Number: Patient Account Number: [id number] Sep 24, 1927 (82 y.o. Treating RN: Cornell Barman Date of Birth/Sex: Female) Other Clinician: Primary Care Physician: Carrie Mew, MIRIAM Treating Christin Fudge Referring Physician: Paulita Cradle Physician/Extender: Suella Grove in Treatment: 2 Active Problems ICD-10 Encounter Code Description Active Date Diagnosis L89.152 Pressure ulcer of sacral region, stage 2 05/07/2016 Yes E44.0 Moderate protein-calorie malnutrition 05/07/2016 Yes F17.218 Nicotine dependence, cigarettes, with other nicotine- 05/07/2016 Yes induced disorders D50.9 Iron deficiency anemia, unspecified 05/07/2016 Yes Inactive Problems Resolved Problems Electronic Signature(s) Signed: 05/21/2016 1:21:08 PM By: Christin Fudge MD, FACS Entered By: Christin Fudge on 05/21/2016 13:21:08 Lydia [name] ([id number]) -------------------------------------------------------------------------------- Progress Note Details Patient Name: Lydia [name] 05/21/2016 12:45 Date of Service: PM Medical Record [id number] Number: Patient Account Number: [id number] 1927/06/10 (82 y.o. Treating RN: Cornell Barman Date of  Birth/Sex: Female) Other Clinician: Primary Care Physician: Carrie Mew, MIRIAM Treating Dymir Neeson Referring Physician: Paulita Cradle Physician/Extender: Suella Grove in Treatment: 2 Subjective Chief Complaint Information obtained from Patient Patient is at the clinic for treatment of an open pressure ulcer to the sacral region which she's had for about 2 years History of Present Illness (HPI) The following HPI elements were documented for the patient's wound: Location: pressure injury to the right of the midline in the sacral region for about 2 years Quality: Patient reports experiencing a dull pain to affected area(s). Severity: Patient states wound are getting worse. Duration: Patient has had the wound for > 2 years prior to seeking treatment at the wound center Timing: Pain in wound is Intermittent (comes and goes Context: The wound would happen gradually Modifying Factors: Other treatment(s) tried include:zinc oxide paste has been applied to this Associated Signs and Symptoms: Patient reports  having difficulty standing for long periods. 82 year old patient with iron deficiency anemia has a past medical history significant for hypertension, osteoporosis,duodenal ulcers, COPD, status post gastric surgery for ulcers, colonoscopy. she is a smoker and smokes about a pack of cigarettes a day for about 65 years. most recent lab work done showed WBC count of 6.6 hemoglobin of 10.8 hematocrit of 35 and platelets of 323 and her BMP was within normal limits. A venous duplex study on the right lower extremity done in November 2015 showed no venous reflux and no evidence of DVT. she was seen by her PCP recently and he found a stage II sacral decubitus ulcer and referred her to others. 05/14/2016 -- I don't believe she's been able to do her dressings well and she continues to smoke. Objective Constitutional Pulse regular. Respirations normal and unlabored. Afebrile. Lydia [name], Lydia [name]  ([id number]) Vitals Time Taken: 12:54 PM, Height: 66 in, Weight: 88.5 lbs, BMI: 14.3, Pulse: 78 bpm, Respiratory Rate: 18 breaths/min, Blood Pressure: 146/70 mmHg. Eyes Nonicteric. Reactive to light. Ears, Nose, Mouth, and Throat Lips, teeth, and gums WNL.Marland Kitchen Moist mucosa without lesions. Neck supple and nontender. No palpable supraclavicular or cervical adenopathy. Normal sized without goiter. Respiratory WNL. No retractions.. Breath sounds WNL, No rubs, rales, rhonchi, or wheeze.. Cardiovascular Heart rhythm and rate regular, no murmur or gallop.. Pedal Pulses WNL. No clubbing, cyanosis or edema. Lymphatic No adneopathy. No adenopathy. No adenopathy. Musculoskeletal Adexa without tenderness or enlargement.. Digits and nails w/o clubbing, cyanosis, infection, petechiae, ischemia, or inflammatory conditions.Marland Kitchen Psychiatric Judgement and insight Intact.. No evidence of depression, anxiety, or agitation.. General Notes: the wound is looking much better today and she has been more compliant with the dressings and no debridement was required. Integumentary (Hair, Skin) No suspicious lesions. No crepitus or fluctuance. No peri-wound warmth or erythema. No masses.. Wound #1 status is Open. Original cause of wound was Pressure Injury. The wound is located on the Medial Sacrum. The wound measures 0.5cm length x 0.3cm width x 0.1cm depth; 0.118cm^2 area and 0.012cm^3 volume. The wound is limited to skin breakdown. There is a medium amount of serous drainage noted. The wound margin is flat and intact. There is large (67-100%) pink granulation within the wound bed. There is a small (1-33%) amount of necrotic tissue within the wound bed including Adherent Slough. The periwound skin appearance exhibited: Moist. The periwound skin appearance did not exhibit: Callus, Crepitus, Excoriation, Fluctuance, Friable, Induration, Localized Edema, Rash, Scarring, Dry/Scaly, Maceration, Atrophie Blanche, Cyanosis,  Ecchymosis, Hemosiderin Staining, Mottled, Pallor, Rubor, Erythema. Assessment Lydia [name], Lydia [name]. ([id number]) Active Problems ICD-10 L89.152 - Pressure ulcer of sacral region, stage 2 E44.0 - Moderate protein-calorie malnutrition F17.218 - Nicotine dependence, cigarettes, with other nicotine-induced disorders D50.9 - Iron deficiency anemia, unspecified Plan Wound Cleansing: Wound #1 Medial Sacrum: Clean wound with Normal Saline. May Shower, gently pat wound dry prior to applying new dressing. Anesthetic: Wound #1 Medial Sacrum: Topical Lidocaine 4% cream applied to wound bed prior to debridement Primary Wound Dressing: Wound #1 Medial Sacrum: Aquacel Ag Secondary Dressing: Wound #1 Medial Sacrum: Boardered Foam Dressing Dressing Change Frequency: Wound #1 Medial Sacrum: Change dressing every other day. Follow-up Appointments: Wound #1 Medial Sacrum: Return Appointment in 1 week. Off-Loading: Wound #1 Medial Sacrum: Turn and reposition every 2 hours Additional Orders / Instructions: Wound #1 Medial Sacrum: Stop Smoking - Follow-up with PCP for smoking cessation prescription. Increase protein intake. Activity as tolerated Medications-please add to medication list.: Other: - VItamin A; Vitamin C and Zinc  Services and Therapies ordered were: Smoking Cessation Lydia [name], [name] ([id number]) I have recommended: 1. Appropriate and constant offloading of this area and have discussed this in detail with her 2. Aquacel Ag with a bordered foam to be applied and changed every other day 3. Adequate protein intake and vitamin supplements including vitamin A, vitamin C and zinc 4. Regular visits to the wound care center. Electronic Signature(s) Signed: 05/21/2016 1:22:25 PM By: Christin Fudge MD, FACS Entered By: Christin Fudge on 05/21/2016 13:22:25 Lydia [name], Lydia [name] ([id number]) -------------------------------------------------------------------------------- SuperBill Details Patient  Name: Lydia [name] Date of Service: 05/21/2016 Medical Record Number: [id number] Patient Account Number: [id number] Date of Birth/Sex: Sep 04, 1927 (82 y.o. Female) Treating RN: Cornell Barman Primary Care Physician: Paulita Cradle Other Clinician: Referring Physician: Paulita Cradle Treating Physician/Extender: Lydia [name] Rider in Treatment: 2 Diagnosis Coding ICD-10 Codes Code Description [id number] Pressure ulcer of sacral region, stage 2 E44.0 Moderate protein-calorie malnutrition F17.218 Nicotine dependence, cigarettes, with other nicotine-induced disorders D50.9 Iron deficiency anemia, unspecified Facility Procedures CPT4 Code: [id number] Description: [phone] - WOUND CARE VISIT-LEV 2 EST PT Modifier: Quantity: 1 Physician Procedures CPT4 Code Description: [id number] - WC PHYS LEVEL 3 - EST PT ICD-10 Description Diagnosis L89.152 Pressure ulcer of sacral region, stage 2 E44.0 Moderate protein-calorie malnutrition F17.218 Nicotine dependence, cigarettes, with other nicot D50.9 Iron  deficiency anemia, unspecified Modifier: ine-induced di Quantity: 1 sorders Engineer, maintenance) Signed: 05/21/2016 1:22:40 PM By: Christin Fudge MD, FACS Previous Signature: 05/21/2016 1:16:05 PM Version By: Gretta Cool RN, BSN, Kim RN, BSN Entered By: Christin Fudge on 05/21/2016 13:22:40

## 2016-05-28 ENCOUNTER — Encounter: Payer: Medicare HMO | Attending: Surgery | Admitting: Surgery

## 2016-05-28 DIAGNOSIS — M810 Age-related osteoporosis without current pathological fracture: Secondary | ICD-10-CM | POA: Diagnosis not present

## 2016-05-28 DIAGNOSIS — D509 Iron deficiency anemia, unspecified: Secondary | ICD-10-CM | POA: Diagnosis not present

## 2016-05-28 DIAGNOSIS — F17218 Nicotine dependence, cigarettes, with other nicotine-induced disorders: Secondary | ICD-10-CM | POA: Insufficient documentation

## 2016-05-28 DIAGNOSIS — L89152 Pressure ulcer of sacral region, stage 2: Secondary | ICD-10-CM | POA: Insufficient documentation

## 2016-05-28 DIAGNOSIS — I10 Essential (primary) hypertension: Secondary | ICD-10-CM | POA: Insufficient documentation

## 2016-05-28 DIAGNOSIS — I495 Sick sinus syndrome: Secondary | ICD-10-CM

## 2016-05-28 DIAGNOSIS — E440 Moderate protein-calorie malnutrition: Secondary | ICD-10-CM | POA: Diagnosis not present

## 2016-05-28 DIAGNOSIS — J449 Chronic obstructive pulmonary disease, unspecified: Secondary | ICD-10-CM | POA: Diagnosis not present

## 2016-05-28 DIAGNOSIS — I4892 Unspecified atrial flutter: Secondary | ICD-10-CM

## 2016-05-28 HISTORY — DX: Unspecified atrial flutter: I48.92

## 2016-05-28 HISTORY — DX: Sick sinus syndrome: I49.5

## 2016-05-29 NOTE — Progress Notes (Signed)
Lydia, [name] ([id number]) Visit Report for 05/28/2016 Arrival Information Details Patient Name: Lydia [name], Lydia [name]. Date of Service: 05/28/2016 3:00 PM Medical Record Number: [id number] Patient Account Number: [id number] Date of Birth/Sex: [birth year] (82 y.o. Female) Treating RN: Lydia [name] Primary Care Physician: Lydia [name] Other Clinician: Referring Physician: Paulita [name] Treating Physician/Extender: Lydia [name] in Treatment: 3 Visit Information History Since Last Visit Added or deleted any medications: No Patient Arrived: Ambulatory Any new allergies or adverse reactions: No Arrival Time: 15:07 Had a fall or experienced change in No Accompanied By: self activities of daily living that may affect Transfer Assistance: None risk of falls: Patient Identification Verified: Yes Signs or symptoms of abuse/neglect since last No Secondary Verification Process Yes visito Completed: Hospitalized since last visit: No Patient Requires Transmission-Based No Has Dressing in Place as Prescribed: Yes Precautions: Pain Present Now: No Patient Has Alerts: No Electronic Signature(s) Signed: 05/28/2016 5:33:54 PM By: Lydia Cool, RN, [name], Lydia [name] Entered By: Lydia Cool, RN, [name], Lydia on 05/28/2016 15:07:43 Rebello, Lydia [name] ([id number]) -------------------------------------------------------------------------------- Clinic Level of Care Assessment Details Patient Name: Lydia [name] Date of Service: 05/28/2016 3:00 PM Medical Record Number: [id number] Patient Account Number: [id number] Date of Birth/Sex: 1927/03/14 (82 y.o. Female) Treating RN: Lydia [name] Primary Care Physician: Lydia [name] Other Clinician: Referring Physician: Paulita [name] Treating Physician/Extender: Lydia [name] in Treatment: 3 Clinic Level of Care Assessment Items TOOL 4 Quantity Score []  - Use when only an EandM is performed on FOLLOW-UP visit 0 ASSESSMENTS - Nursing Assessment /  Reassessment []  - Reassessment of Co-morbidities (includes updates in patient status) 0 []  - Reassessment of Adherence to Treatment Plan 0 ASSESSMENTS - Wound and Skin Assessment / Reassessment X - Simple Wound Assessment / Reassessment - one wound 1 5 []  - Complex Wound Assessment / Reassessment - multiple wounds 0 []  - Dermatologic / Skin Assessment (not related to wound area) 0 ASSESSMENTS - Focused Assessment []  - Circumferential Edema Measurements - multi extremities 0 []  - Nutritional Assessment / Counseling / Intervention 0 []  - Lower Extremity Assessment (monofilament, tuning fork, pulses) 0 []  - Peripheral Arterial Disease Assessment (using hand held doppler) 0 ASSESSMENTS - Ostomy and/or Continence Assessment and Care []  - Incontinence Assessment and Management 0 []  - Ostomy Care Assessment and Management (repouching, etc.) 0 PROCESS - Coordination of Care []  - Simple Patient / Family Education for ongoing care 0 []  - Complex (extensive) Patient / Family Education for ongoing care 0 []  - Staff obtains Programmer, systems, Records, Test Results / Process Orders 0 []  - Staff telephones HHA, Nursing Homes / Clarify orders / etc 0 []  - Routine Transfer to another Facility (non-emergent condition) 0 Lydia, Deshundra D. ([id number]) []  - Routine Hospital Admission (non-emergent condition) 0 []  - New Admissions / Biomedical engineer / Ordering NPWT, Apligraf, etc. 0 []  - Emergency Hospital Admission (emergent condition) 0 []  - Simple Discharge Coordination 0 []  - Complex (extensive) Discharge Coordination 0 PROCESS - Special Needs []  - Pediatric / Minor Patient Management 0 []  - Isolation Patient Management 0 []  - Hearing / Language / Visual special needs 0 []  - Assessment of Community assistance (transportation, D/C planning, etc.) 0 []  - Additional assistance / Altered mentation 0 []  - Support Surface(s) Assessment (bed, cushion, seat, etc.) 0 INTERVENTIONS - Wound Cleansing /  Measurement []  - Simple Wound Cleansing - one wound 0 []  - Complex Wound Cleansing - multiple wounds 0 X - Wound Imaging (photographs - any number of wounds) 1 5 []  -  Wound Tracing (instead of photographs) 0 []  - Simple Wound Measurement - one wound 0 []  - Complex Wound Measurement - multiple wounds 0 INTERVENTIONS - Wound Dressings []  - Small Wound Dressing one or multiple wounds 0 []  - Medium Wound Dressing one or multiple wounds 0 []  - Large Wound Dressing one or multiple wounds 0 []  - Application of Medications - topical 0 []  - Application of Medications - injection 0 INTERVENTIONS - Miscellaneous []  - External ear exam 0 Lydia [name], [name] ([id number]) []  - Specimen Collection (cultures, biopsies, blood, body fluids, etc.) 0 []  - Specimen(s) / Culture(s) sent or taken to Lab for analysis 0 []  - Patient Transfer (multiple staff / Lydia [name] / Similar devices) 0 []  - Simple Staple / Suture removal (25 or less) 0 []  - Complex Staple / Suture removal (26 or more) 0 []  - Hypo / Hyperglycemic Management (close monitor of Blood Glucose) 0 []  - Ankle / Brachial Index (ABI) - do not check if billed separately 0 X - Vital Signs 1 5 Has the patient been seen at the hospital within the last three years: Yes Total Score: 15 Level Of Care: New/Established - Level 1 Electronic Signature(s) Signed: 05/28/2016 5:33:54 PM By: Lydia Cool, RN, [name], Lydia [name] Entered By: Lydia Cool, RN, [name], Lydia on 05/28/2016 15:20:38 Lydia [name] ([id number]) -------------------------------------------------------------------------------- Encounter Discharge Information Details Patient Name: Lydia [name] Date of Service: 05/28/2016 3:00 PM Medical Record Number: [id number] Patient Account Number: [id number] Date of Birth/Sex: [birth year] (82 y.o. Female) Treating RN: Lydia [name] Primary Care Physician: Lydia [name] Other Clinician: Referring Physician: Paulita [name] Treating Physician/Extender: Lydia [name] in Treatment: 3 Encounter Discharge Information Items Schedule Follow-up Appointment: No Medication Reconciliation completed No and provided to Patient/Care Lydia [name]: Provided on Clinical Summary of Care: 05/28/2016 Form Type Recipient Paper Patient [place] Signature(s) Signed: 05/28/2016 3:17:04 PM By: Ruthine Dose Entered By: Ruthine Dose on 05/28/2016 15:17:03 Lydia [name], [name] ([id number]) -------------------------------------------------------------------------------- Multi Wound Chart Details Patient Name: Lydia [name] Date of Service: 05/28/2016 3:00 PM Medical Record Number: [id number] Patient Account Number: [id number] Date of Birth/Sex: Jul 16, 1927 (82 y.o. Female) Treating RN: Lydia [name] Primary Care Physician: Lydia [name] Other Clinician: Referring Physician: Paulita [name] Treating Physician/Extender: Lydia [name] in Treatment: 3 Vital Signs Height(in): 66 Pulse(bpm): 72 Weight(lbs): 88.5 Blood Pressure 107/56 (mmHg): Body Mass Index(BMI): 14 Temperature(F): 97.5 Respiratory Rate 18 (breaths/min): Photos: [1:No Photos] [N/A:N/A] Wound Location: [1:Sacrum - Medial] [N/A:N/A] Wounding Event: [1:Pressure Injury] [N/A:N/A] Primary Etiology: [1:Pressure Ulcer] [N/A:N/A] Comorbid History: [1:Cataracts, Anemia, Chronic Obstructive Pulmonary Disease (COPD), Hypertension] [N/A:N/A] Date Acquired: [1:04/27/2014] [N/A:N/A] Weeks of Treatment: [1:3] [N/A:N/A] Wound Status: [1:Healed - Epithelialized] [N/A:N/A] Measurements L x W x D 0x0x0 [N/A:N/A] (cm) Area (cm) : [1:0] [N/A:N/A] Volume (cm) : [1:0] [N/A:N/A] % Reduction in Area: [1:100.00%] [N/A:N/A] % Reduction in Volume: 100.00% [N/A:N/A] Classification: [1:Category/Stage II] [N/A:N/A] Exudate Amount: [1:Medium] [N/A:N/A] Exudate Type: [1:Serous] [N/A:N/A] Exudate Color: [1:amber] [N/A:N/A] Wound Margin: [1:Flat and Intact] [N/A:N/A] Granulation Amount: [1:Large  (67-100%)] [N/A:N/A] Granulation Quality: [1:Pink] [N/A:N/A] Necrotic Amount: [1:Small (1-33%)] [N/A:N/A] Exposed Structures: [1:Fascia: No Fat: No Tendon: No Muscle: No Joint: No] [N/A:N/A] Bone: No Limited to Skin Breakdown Epithelialization: Large (67-100%) N/A N/A Periwound Skin Texture: Edema: No N/A N/A Excoriation: No Induration: No Callus: No Crepitus: No Fluctuance: No Friable: No Rash: No Scarring: No Periwound Skin Moist: Yes N/A N/A Moisture: Maceration: No Dry/Scaly: No Periwound Skin Color: Atrophie Blanche: No N/A N/A Cyanosis: No Ecchymosis: No Erythema: No Hemosiderin Staining: No Mottled:  No Pallor: No Rubor: No Tenderness on No N/A N/A Palpation: Wound Preparation: Ulcer Cleansing: N/A N/A Rinsed/Irrigated with Saline Topical Anesthetic Applied: None Treatment Notes Electronic Signature(s) Signed: 05/28/2016 5:33:54 PM By: Lydia Cool, RN, [name], Lydia [name] Entered By: Lydia Cool, RN, [name], Lydia on 05/28/2016 15:15:05 Lydia [name] ([id number]) -------------------------------------------------------------------------------- [place] Details Patient Name: Lydia [name] Date of Service: 05/28/2016 3:00 PM Medical Record Number: [id number] Patient Account Number: [id number] Date of Birth/Sex: 11-06-1927 (82 y.o. Female) Treating RN: Lydia [name] Primary Care Physician: Lydia [name] Other Clinician: Referring Physician: Paulita [name] Treating Physician/Extender: Lydia [name] in Treatment: 3 Active Inactive Electronic Signature(s) Signed: 05/28/2016 5:33:54 PM By: Lydia Cool, RN, [name], Lydia [name] Entered By: Lydia Cool, RN, [name], Lydia on 05/28/2016 15:14:57 Lydia [name], Lydia [name] ([id number]) -------------------------------------------------------------------------------- Pain Assessment Details Patient Name: Lydia [name] Date of Service: 05/28/2016 3:00 PM Medical Record Number: [id number] Patient Account Number: [id number] Date of  Birth/Sex: 11-May-1927 (82 y.o. Female) Treating RN: Lydia [name] Primary Care Physician: Lydia [name] Other Clinician: Referring Physician: Paulita [name] Treating Physician/Extender: Lydia [name] in Treatment: 3 Active Problems Location of Pain Severity and Description of Pain Patient Has Paino No Site Locations With Dressing Change: No Pain Management and Medication Current Pain Management: Electronic Signature(s) Signed: 05/28/2016 5:33:54 PM By: Lydia Cool, RN, [name], Lydia [name] Entered By: Lydia Cool, RN, [name], Lydia on 05/28/2016 15:08:08 Lydia [name] ([id number]) -------------------------------------------------------------------------------- Wound Assessment Details Patient Name: Lydia [name] Date of Service: 05/28/2016 3:00 PM Medical Record Number: [id number] Patient Account Number: [id number] Date of Birth/Sex: [birth year] (82 y.o. Female) Treating RN: Lydia [name] Primary Care Physician: Lydia [name] Other Clinician: Referring Physician: Paulita [name] Treating Physician/Extender: Lydia [name] in Treatment: 3 Wound Status Wound Number: 1 Primary Pressure Ulcer Etiology: Wound Location: Sacrum - Medial Wound Healed - Epithelialized Wounding Event: Pressure Injury Status: Date Acquired: 04/27/2014 Comorbid Cataracts, Anemia, Chronic Weeks Of Treatment: 3 History: Obstructive Pulmonary Disease Clustered Wound: No (COPD), Hypertension Photos Photo Uploaded By: Lydia Cool, RN, [name], Lydia on 05/28/2016 16:21:09 Wound Measurements Length: (cm) 0 Width: (cm) 0 Depth: (cm) 0 Area: (cm) 0 Volume: (cm) 0 % Reduction in Area: 100% % Reduction in Volume: 100% Epithelialization: Large (67-100%) Wound Description Classification: Category/Stage II Wound Margin: Flat and Intact Exudate Amount: Medium Exudate Type: Serous Exudate Color: amber Wound Bed Granulation Amount: Large (67-100%) Exposed Structure Granulation Quality: Pink Fascia Exposed:  No Necrotic Amount: Small (1-33%) Fat Layer Exposed: No Necrotic Quality: Adherent Slough Tendon Exposed: No Lydia [name], [name] ([id number]) Muscle Exposed: No Joint Exposed: No Bone Exposed: No Limited to Skin Breakdown Periwound Skin Texture Texture Color No Abnormalities Noted: No No Abnormalities Noted: No Callus: No Atrophie Blanche: No Crepitus: No Cyanosis: No Excoriation: No Ecchymosis: No Fluctuance: No Erythema: No Friable: No Hemosiderin Staining: No Induration: No Mottled: No Localized Edema: No Pallor: No Rash: No Rubor: No Scarring: No Moisture No Abnormalities Noted: No Dry / Scaly: No Maceration: No Moist: Yes Wound Preparation Ulcer Cleansing: Rinsed/Irrigated with Saline Topical Anesthetic Applied: None Electronic Signature(s) Signed: 05/28/2016 5:33:54 PM By: Lydia Cool, RN, [name], Lydia [name] Entered By: Lydia Cool, RN, [name], Lydia on 05/28/2016 15:14:44 Lydia [name], [name] ([id number]) -------------------------------------------------------------------------------- Vitals Details Patient Name: Lydia [name] Date of Service: 05/28/2016 3:00 PM Medical Record Number: [id number] Patient Account Number: [id number] Date of Birth/Sex: [birth year] (82 y.o. Female) Treating RN: Lydia [name] Primary Care Physician: Lydia [name] Other Clinician: Referring Physician: Paulita [name] Treating Physician/Extender: Lydia [name] in Treatment: 3 Vital Signs Time Taken:  15:08 Temperature (F): 97.5 Height (in): 66 Pulse (bpm): 72 Weight (lbs): 88.5 Respiratory Rate (breaths/min): 18 Body Mass Index (BMI): 14.3 Blood Pressure (mmHg): 107/56 Reference Range: 80 - 120 mg / dl Electronic Signature(s) Signed: 05/28/2016 5:33:54 PM By: Lydia Cool, RN, [name], Lydia [name] Entered By: Lydia Cool, RN, [name], Lydia on 05/28/2016 15:08:30

## 2016-05-29 NOTE — Progress Notes (Signed)
LEANDREA, SCAGNELLI ([id number]) Visit Report for 05/28/2016 Chief Complaint Document Details Patient Name: Lydia [name], Lydia [name]. Date of Service: 05/28/2016 3:00 PM Medical Record Number: [id number] Patient Account Number: [id number] Date of Birth/Sex: 1927/09/17 (82 y.o. Female) Treating RN: Cornell Barman Primary Care Physician: Paulita Cradle Other Clinician: Referring Physician: Paulita Cradle Treating Physician/Extender: Frann Rider in Treatment: 3 Information Obtained from: Patient Chief Complaint Patient is at the clinic for treatment of an open pressure ulcer to the sacral region which she's had for about 2 years Electronic Signature(s) Signed: 05/28/2016 3:38:23 PM By: Christin Fudge MD, FACS Entered By: Christin Fudge on 05/28/2016 15:38:23 Mattern, Kristiana D. ([id number]) -------------------------------------------------------------------------------- HPI Details Patient Name: Lydia [name] Date of Service: 05/28/2016 3:00 PM Medical Record Number: [id number] Patient Account Number: [id number] Date of Birth/Sex: 1927/12/10 (82 y.o. Female) Treating RN: Cornell Barman Primary Care Physician: Paulita Cradle Other Clinician: Referring Physician: Paulita Cradle Treating Physician/Extender: Frann Rider in Treatment: 3 History of Present Illness Location: pressure injury to the right of the midline in the sacral region for about 2 years Quality: Patient reports experiencing a dull pain to affected area(s). Severity: Patient states wound are getting worse. Duration: Patient has had the wound for > 2 years prior to seeking treatment at the wound center Timing: Pain in wound is Intermittent (comes and goes Context: The wound would happen gradually Modifying Factors: Other treatment(s) tried include:zinc oxide paste has been applied to this Associated Signs and Symptoms: Patient reports having difficulty standing for long periods. HPI Description: 82 year old patient with  iron deficiency anemia has a past medical history significant for hypertension, osteoporosis,duodenal ulcers, COPD, status post gastric surgery for ulcers, colonoscopy. she is a smoker and smokes about a pack of cigarettes a day for about 65 years. most recent lab work done showed WBC count of 6.6 hemoglobin of 10.8 hematocrit of 35 and platelets of 323 and her BMP was within normal limits. A venous duplex study on the right lower extremity done in November 2015 showed no venous reflux and no evidence of DVT. she was seen by her PCP recently and he found a stage II sacral decubitus ulcer and referred her to others. 05/14/2016 -- I don't believe she's been able to do her dressings well and she continues to smoke. Electronic Signature(s) Signed: 05/28/2016 3:38:27 PM By: Christin Fudge MD, FACS Entered By: Christin Fudge on 05/28/2016 15:38:27 [name] ([id number]) -------------------------------------------------------------------------------- Physical Exam Details Patient Name: Lydia [name] Date of Service: 05/28/2016 3:00 PM Medical Record Number: [id number] Patient Account Number: [id number] Date of Birth/Sex: 1927-07-01 (82 y.o. Female) Treating RN: Cornell Barman Primary Care Physician: Paulita Cradle Other Clinician: Referring Physician: Paulita Cradle Treating Physician/Extender: Frann Rider in Treatment: 3 Constitutional . Pulse regular. Respirations normal and unlabored. Afebrile. . Eyes Nonicteric. Reactive to light. Ears, Nose, Mouth, and Throat Lips, teeth, and gums WNL.Marland Kitchen Moist mucosa without lesions. Neck supple and nontender. No palpable supraclavicular or cervical adenopathy. Normal sized without goiter. Respiratory WNL. No retractions.. Breath sounds WNL, No rubs, rales, rhonchi, or wheeze.. Cardiovascular Heart rhythm and rate regular, no murmur or gallop.. Pedal Pulses WNL. No clubbing, cyanosis or edema. Chest Breasts symmetical and no nipple  discharge.. Breast tissue WNL, no masses, lumps, or tenderness.. Gastrointestinal (GI) Abdomen without masses or tenderness.. No liver or spleen enlargement or tenderness.. Genitourinary (GU) No hydrocele, spermatocele, tenderness of the cord, or testicular mass.Marland Kitchen Penis without lesions.Lydia [name] without lesions. No cystocele, or rectocele. Pelvic support intact, no discharge.Marland Kitchen Urethra without  masses, tenderness or scarring.Marland Kitchen Lymphatic No adneopathy. No adenopathy. No adenopathy. Musculoskeletal Adexa without tenderness or enlargement.. Digits and nails w/o clubbing, cyanosis, infection, petechiae, ischemia, or inflammatory conditions.. Integumentary (Hair, Skin) No suspicious lesions. No crepitus or fluctuance. No peri-wound warmth or erythema. No masses.Marland Kitchen Psychiatric Judgement and insight Intact.. No evidence of depression, anxiety, or agitation.. Notes the wound is completely healed and there are no open areas near the sacral region Electronic Signature(s) Signed: 05/28/2016 3:38:43 PM By: Christin Fudge MD, FACS SIMON, DEVILLERS D. ([id number]) Entered By: Christin Fudge on 05/28/2016 15:38:41 [name] ([id number]) -------------------------------------------------------------------------------- Physician Orders Details Patient Name: Lydia [name] Date of Service: 05/28/2016 3:00 PM Medical Record Number: [id number] Patient Account Number: [id number] Date of Birth/Sex: [birth year] (82 y.o. Female) Treating RN: Cornell Barman Primary Care Physician: Paulita Cradle Other Clinician: Referring Physician: Paulita Cradle Treating Physician/Extender: Frann Rider in Treatment: 3 Verbal / Phone Orders: Yes Clinician: Cornell Barman Read Back and Verified: Yes Diagnosis Coding Discharge From [facility] Services o Discharge from [place] - treatment complete Electronic Signature(s) Signed: 05/28/2016 4:11:18 PM By: Christin Fudge MD, FACS Signed: 05/28/2016 5:33:54 PM By:  Gretta Cool RN, BSN, Kim RN, BSN Entered By: Gretta Cool, RN, BSN, Kim on 05/28/2016 15:15:29 Timm, Toni Arthurs ([id number]) -------------------------------------------------------------------------------- Problem List Details Patient Name: Lydia [name] Date of Service: 05/28/2016 3:00 PM Medical Record Number: [id number] Patient Account Number: [id number] Date of Birth/Sex: [birth year] (82 y.o. Female) Treating RN: Cornell Barman Primary Care Physician: Paulita Cradle Other Clinician: Referring Physician: Paulita Cradle Treating Physician/Extender: Frann Rider in Treatment: 3 Active Problems ICD-10 Encounter Code Description Active Date Diagnosis L89.152 Pressure ulcer of sacral region, stage 2 05/07/2016 Yes E44.0 Moderate protein-calorie malnutrition 05/07/2016 Yes F17.218 Nicotine dependence, cigarettes, with other nicotine- 05/07/2016 Yes induced disorders D50.9 Iron deficiency anemia, unspecified 05/07/2016 Yes Inactive Problems Resolved Problems Electronic Signature(s) Signed: 05/28/2016 3:38:12 PM By: Christin Fudge MD, FACS Entered By: Christin Fudge on 05/28/2016 15:38:11 Girton, Giulianna D. ([id number]) -------------------------------------------------------------------------------- Progress Note Details Patient Name: Lydia [name] Date of Service: 05/28/2016 3:00 PM Medical Record Number: [id number] Patient Account Number: [id number] Date of Birth/Sex: 1927-03-29 (82 y.o. Female) Treating RN: Cornell Barman Primary Care Physician: Paulita Cradle Other Clinician: Referring Physician: Paulita Cradle Treating Physician/Extender: Frann Rider in Treatment: 3 Subjective Chief Complaint Information obtained from Patient Patient is at the clinic for treatment of an open pressure ulcer to the sacral region which she's had for about 2 years History of Present Illness (HPI) The following HPI elements were documented for the patient's wound: Location: pressure injury to  the right of the midline in the sacral region for about 2 years Quality: Patient reports experiencing a dull pain to affected area(s). Severity: Patient states wound are getting worse. Duration: Patient has had the wound for > 2 years prior to seeking treatment at the wound center Timing: Pain in wound is Intermittent (comes and goes Context: The wound would happen gradually Modifying Factors: Other treatment(s) tried include:zinc oxide paste has been applied to this Associated Signs and Symptoms: Patient reports having difficulty standing for long periods. 82 year old patient with iron deficiency anemia has a past medical history significant for hypertension, osteoporosis,duodenal ulcers, COPD, status post gastric surgery for ulcers, colonoscopy. she is a smoker and smokes about a pack of cigarettes a day for about 65 years. most recent lab work done showed WBC count of 6.6 hemoglobin of 10.8 hematocrit of 35 and platelets of 323 and her BMP was within normal limits. A venous  duplex study on the right lower extremity done in November 2015 showed no venous reflux and no evidence of DVT. she was seen by her PCP recently and he found a stage II sacral decubitus ulcer and referred her to others. 05/14/2016 -- I don't believe she's been able to do her dressings well and she continues to smoke. Objective Constitutional Pulse regular. Respirations normal and unlabored. Afebrile. Vitals Time Taken: 3:08 PM, Height: 66 in, Weight: 88.5 lbs, BMI: 14.3, Temperature: 97.5 F, Pulse: 72 Pasquariello, Monteen D. ([id number]) bpm, Respiratory Rate: 18 breaths/min, Blood Pressure: 107/56 mmHg. Eyes Nonicteric. Reactive to light. Ears, Nose, Mouth, and Throat Lips, teeth, and gums WNL.Marland Kitchen Moist mucosa without lesions. Neck supple and nontender. No palpable supraclavicular or cervical adenopathy. Normal sized without goiter. Respiratory WNL. No retractions.. Breath sounds WNL, No rubs, rales, rhonchi, or  wheeze.. Cardiovascular Heart rhythm and rate regular, no murmur or gallop.. Pedal Pulses WNL. No clubbing, cyanosis or edema. Chest Breasts symmetical and no nipple discharge.. Breast tissue WNL, no masses, lumps, or tenderness.. Gastrointestinal (GI) Abdomen without masses or tenderness.. No liver or spleen enlargement or tenderness.. Genitourinary (GU) No hydrocele, spermatocele, tenderness of the cord, or testicular mass.Marland Kitchen Penis without lesions.Lydia [name] without lesions. No cystocele, or rectocele. Pelvic support intact, no discharge.Marland Kitchen Urethra without masses, tenderness or scarring.Marland Kitchen Lymphatic No adneopathy. No adenopathy. No adenopathy. Musculoskeletal Adexa without tenderness or enlargement.. Digits and nails w/o clubbing, cyanosis, infection, petechiae, ischemia, or inflammatory conditions.Marland Kitchen Psychiatric Judgement and insight Intact.. No evidence of depression, anxiety, or agitation.. General Notes: the wound is completely healed and there are no open areas near the sacral region Integumentary (Hair, Skin) No suspicious lesions. No crepitus or fluctuance. No peri-wound warmth or erythema. No masses.. Wound #1 status is Healed - Epithelialized. Original cause of wound was Pressure Injury. The wound is located on the Medial Sacrum. The wound measures 0cm length x 0cm width x 0cm depth; 0cm^2 area and 0cm^3 volume. The wound is limited to skin breakdown. There is a medium amount of serous drainage noted. The wound margin is flat and intact. There is large (67-100%) pink granulation within the wound bed. There is a small (1-33%) amount of necrotic tissue within the wound bed including Adherent Slough. The periwound skin appearance exhibited: Moist. The periwound skin appearance did not exhibit: Callus, Beckstead, Jeaninne D. ([id number]) Crepitus, Excoriation, Fluctuance, Friable, Induration, Localized Edema, Rash, Scarring, Dry/Scaly, Maceration, Atrophie Blanche, Cyanosis, Ecchymosis,  Hemosiderin Staining, Mottled, Pallor, Rubor, Erythema. Assessment Active Problems ICD-10 L89.152 - Pressure ulcer of sacral region, stage 2 E44.0 - Moderate protein-calorie malnutrition F17.218 - Nicotine dependence, cigarettes, with other nicotine-induced disorders D50.9 - Iron deficiency anemia, unspecified Plan Discharge From [facility] Services: Discharge from [place] - treatment complete having healed out her wound, I have recommended: 1. Appropriate and constant offloading of this area and have discussed this in detail with her 2. bordered foam to be applied and changed every other day so that this supple scar heals 3. Adequate protein intake and vitamin supplements including vitamin A, vitamin C and zinc 4. he is discharged from the wound care center and will be seen back on a when necessary basis. Electronic Signature(s) Signed: 05/28/2016 3:41:48 PM By: Christin Fudge MD, FACS Entered By: Christin Fudge on 05/28/2016 15:41:48 Koogler, Toni Arthurs ([id number]) -------------------------------------------------------------------------------- SuperBill Details Patient Name: Lydia [name] Date of Service: 05/28/2016 Medical Record Number: [id number] Patient Account Number: [id number] Date of Birth/Sex: 1927/04/17 (82 y.o. Female) Treating RN: Cornell Barman Primary Care Physician:  MCLAUGHLIN, MIRIAM Other Clinician: Referring Physician: Carrie Mew, MIRIAM Treating Physician/Extender: Frann Rider in Treatment: 3 Diagnosis Coding ICD-10 Codes Code Description L89.152 Pressure ulcer of sacral region, stage 2 E44.0 Moderate protein-calorie malnutrition F17.218 Nicotine dependence, cigarettes, with other nicotine-induced disorders D50.9 Iron deficiency anemia, unspecified Facility Procedures CPT4 Code: [id number] Description: [phone] - WOUND CARE VISIT-LEV 1 EST PT Modifier: Quantity: 1 Physician Procedures CPT4 Code Description: [id number] - WC PHYS LEVEL 2 - EST PT ICD-10  Description Diagnosis L89.152 Pressure ulcer of sacral region, stage 2 E44.0 Moderate protein-calorie malnutrition F17.218 Nicotine dependence, cigarettes, with other nicot Modifier: ine-induced di Quantity: 1 sorders Engineer, maintenance) Signed: 05/28/2016 3:42:38 PM By: Christin Fudge MD, FACS Previous Signature: 05/28/2016 3:42:03 PM Version By: Christin Fudge MD, FACS Entered By: Christin Fudge on 05/28/2016 15:42:38

## 2016-06-05 ENCOUNTER — Encounter
Admission: RE | Disposition: A | Discharge status: Other Acute Inpatient Hospital | Payer: Self-pay | Source: Ambulatory Visit | Attending: Internal Medicine

## 2016-06-05 ENCOUNTER — Encounter: Payer: Self-pay | Admitting: *Deleted

## 2016-06-05 ENCOUNTER — Inpatient Hospital Stay
Admission: RE | Admit: 2016-06-05 | Discharge: 2016-06-08 | DRG: 308 | Disposition: A | Discharge status: Other Acute Inpatient Hospital | Payer: Medicare HMO | Source: Ambulatory Visit | Attending: Internal Medicine | Admitting: Internal Medicine

## 2016-06-05 DIAGNOSIS — D751 Secondary polycythemia: Secondary | ICD-10-CM | POA: Diagnosis present

## 2016-06-05 DIAGNOSIS — I483 Typical atrial flutter: Secondary | ICD-10-CM | POA: Insufficient documentation

## 2016-06-05 DIAGNOSIS — F17200 Nicotine dependence, unspecified, uncomplicated: Secondary | ICD-10-CM | POA: Insufficient documentation

## 2016-06-05 DIAGNOSIS — F17210 Nicotine dependence, cigarettes, uncomplicated: Secondary | ICD-10-CM | POA: Diagnosis present

## 2016-06-05 DIAGNOSIS — Z8711 Personal history of peptic ulcer disease: Secondary | ICD-10-CM | POA: Diagnosis not present

## 2016-06-05 DIAGNOSIS — I4891 Unspecified atrial fibrillation: Secondary | ICD-10-CM | POA: Diagnosis not present

## 2016-06-05 DIAGNOSIS — I998 Other disorder of circulatory system: Secondary | ICD-10-CM | POA: Diagnosis present

## 2016-06-05 DIAGNOSIS — I10 Essential (primary) hypertension: Secondary | ICD-10-CM

## 2016-06-05 DIAGNOSIS — I480 Paroxysmal atrial fibrillation: Principal | ICD-10-CM

## 2016-06-05 DIAGNOSIS — Z79899 Other long term (current) drug therapy: Secondary | ICD-10-CM

## 2016-06-05 DIAGNOSIS — E041 Nontoxic single thyroid nodule: Secondary | ICD-10-CM | POA: Diagnosis present

## 2016-06-05 DIAGNOSIS — I4892 Unspecified atrial flutter: Secondary | ICD-10-CM | POA: Diagnosis present

## 2016-06-05 DIAGNOSIS — M810 Age-related osteoporosis without current pathological fracture: Secondary | ICD-10-CM | POA: Diagnosis present

## 2016-06-05 DIAGNOSIS — M545 Low back pain: Secondary | ICD-10-CM | POA: Diagnosis present

## 2016-06-05 DIAGNOSIS — Z681 Body mass index (BMI) 19 or less, adult: Secondary | ICD-10-CM

## 2016-06-05 DIAGNOSIS — Z23 Encounter for immunization: Secondary | ICD-10-CM

## 2016-06-05 DIAGNOSIS — E43 Unspecified severe protein-calorie malnutrition: Secondary | ICD-10-CM | POA: Insufficient documentation

## 2016-06-05 DIAGNOSIS — J449 Chronic obstructive pulmonary disease, unspecified: Secondary | ICD-10-CM | POA: Diagnosis present

## 2016-06-05 DIAGNOSIS — G8929 Other chronic pain: Secondary | ICD-10-CM | POA: Diagnosis present

## 2016-06-05 DIAGNOSIS — E860 Dehydration: Secondary | ICD-10-CM | POA: Diagnosis present

## 2016-06-05 DIAGNOSIS — I700 Atherosclerosis of aorta: Secondary | ICD-10-CM | POA: Diagnosis present

## 2016-06-05 DIAGNOSIS — I495 Sick sinus syndrome: Secondary | ICD-10-CM

## 2016-06-05 DIAGNOSIS — Z803 Family history of malignant neoplasm of breast: Secondary | ICD-10-CM | POA: Diagnosis not present

## 2016-06-05 DIAGNOSIS — J42 Unspecified chronic bronchitis: Secondary | ICD-10-CM | POA: Insufficient documentation

## 2016-06-05 DIAGNOSIS — I739 Peripheral vascular disease, unspecified: Secondary | ICD-10-CM | POA: Diagnosis present

## 2016-06-05 DIAGNOSIS — I455 Other specified heart block: Secondary | ICD-10-CM | POA: Insufficient documentation

## 2016-06-05 DIAGNOSIS — I481 Persistent atrial fibrillation: Secondary | ICD-10-CM | POA: Diagnosis not present

## 2016-06-05 HISTORY — DX: Stricture of artery: I77.1

## 2016-06-05 HISTORY — DX: Nontoxic single thyroid nodule: E04.1

## 2016-06-05 HISTORY — DX: Unspecified atrial flutter: I48.92

## 2016-06-05 HISTORY — DX: Abnormal weight loss: R63.4

## 2016-06-05 HISTORY — DX: Essential (primary) hypertension: I10

## 2016-06-05 HISTORY — DX: Tobacco use: Z72.0

## 2016-06-05 HISTORY — DX: Peripheral vascular disease, unspecified: I73.9

## 2016-06-05 HISTORY — DX: Sick sinus syndrome: I49.5

## 2016-06-05 HISTORY — DX: Chronic vascular disorders of intestine: K55.1

## 2016-06-05 HISTORY — DX: Solitary pulmonary nodule: R91.1

## 2016-06-05 LAB — CBC
HEMATOCRIT: 46.5 % (ref 35.0–47.0)
HEMOGLOBIN: 15.4 g/dL (ref 12.0–16.0)
MCH: 27.7 pg (ref 26.0–34.0)
MCHC: 33.1 g/dL (ref 32.0–36.0)
MCV: 83.6 fL (ref 80.0–100.0)
PLATELETS: 305 10*3/uL (ref 150–440)
RBC: 5.56 MIL/uL — AB (ref 3.80–5.20)
RDW: 32.6 % — ABNORMAL HIGH (ref 11.5–14.5)
WBC: 6.8 10*3/uL (ref 3.6–11.0)

## 2016-06-05 LAB — PROTIME-INR
INR: 0.94
PROTHROMBIN TIME: 12.8 s (ref 11.4–15.0)

## 2016-06-05 LAB — HEPARIN LEVEL (UNFRACTIONATED)

## 2016-06-05 LAB — BASIC METABOLIC PANEL
Anion gap: 8 (ref 5–15)
BUN: 24 mg/dL — ABNORMAL HIGH (ref 6–20)
CHLORIDE: 98 mmol/L — AB (ref 101–111)
CO2: 32 mmol/L (ref 22–32)
CREATININE: 0.78 mg/dL (ref 0.44–1.00)
Calcium: 8.9 mg/dL (ref 8.9–10.3)
GFR calc non Af Amer: >60 mL/min (ref >60)
Glucose, Bld: 105 mg/dL — ABNORMAL HIGH (ref 65–99)
POTASSIUM: 4.1 mmol/L (ref 3.5–5.1)
Sodium: 138 mmol/L (ref 135–145)

## 2016-06-05 LAB — APTT: APTT: 29 s (ref 24–36)

## 2016-06-05 LAB — MAGNESIUM: MAGNESIUM: 1.8 mg/dL (ref 1.7–2.4)

## 2016-06-05 LAB — TSH: TSH: 1.673 u[IU]/mL (ref 0.350–4.500)

## 2016-06-05 SURGERY — VISCERAL ANGIOGRAPHY
Anesthesia: Moderate Sedation

## 2016-06-05 MED ORDER — ONDANSETRON HCL 4 MG PO TABS: 4.0000 mg | ORAL_TABLET | Freq: Four times a day (QID) | ORAL | Status: DC | PRN

## 2016-06-05 MED ORDER — SODIUM CHLORIDE 0.9 % IV SOLN
INTRAVENOUS | Status: DC
  Administered 2016-06-05 – 2016-06-06 (×4): via INTRAVENOUS

## 2016-06-05 MED ORDER — HEPARIN BOLUS VIA INFUSION
1200.0000 [IU] | Freq: Once | INTRAVENOUS | Status: AC
  Administered 2016-06-05: 1200 [IU] via INTRAVENOUS
  Filled 2016-06-05: qty 1200

## 2016-06-05 MED ORDER — CLINDAMYCIN PHOSPHATE 300 MG/50ML IV SOLN: 300.0000 mg | Freq: Once | INTRAVENOUS | Status: DC

## 2016-06-05 MED ORDER — ALBUTEROL SULFATE (2.5 MG/3ML) 0.083% IN NEBU
2.5000 mg | INHALATION_SOLUTION | RESPIRATORY_TRACT | Status: DC | PRN
  Filled 2016-06-05: qty 3

## 2016-06-05 MED ORDER — ACETAMINOPHEN 325 MG PO TABS: 650.0000 mg | ORAL_TABLET | Freq: Four times a day (QID) | ORAL | Status: DC | PRN

## 2016-06-05 MED ORDER — DILTIAZEM HCL 25 MG/5ML IV SOLN
10.0000 mg | Freq: Once | INTRAVENOUS | Status: AC
  Administered 2016-06-05: 10 mg via INTRAVENOUS

## 2016-06-05 MED ORDER — PANTOPRAZOLE SODIUM 40 MG PO TBEC
40.0000 mg | DELAYED_RELEASE_TABLET | Freq: Every day | ORAL | Status: DC
  Administered 2016-06-05 – 2016-06-08 (×4): 40 mg via ORAL
  Filled 2016-06-05 (×5): qty 1

## 2016-06-05 MED ORDER — DILTIAZEM HCL 30 MG PO TABS: 30.0000 mg | ORAL_TABLET | Freq: Four times a day (QID) | ORAL | Status: DC

## 2016-06-05 MED ORDER — ACETAMINOPHEN 650 MG RE SUPP: 650.0000 mg | Freq: Four times a day (QID) | RECTAL | Status: DC | PRN

## 2016-06-05 MED ORDER — HEPARIN BOLUS VIA INFUSION
2000.0000 [IU] | Freq: Once | INTRAVENOUS | Status: AC
Start: 2016-06-05 — End: 2016-06-05
  Administered 2016-06-05: 2000 [IU] via INTRAVENOUS
  Filled 2016-06-05: qty 2000

## 2016-06-05 MED ORDER — METOPROLOL TARTRATE 50 MG PO TABS
50.0000 mg | ORAL_TABLET | Freq: Two times a day (BID) | ORAL | Status: DC
Start: 2016-06-05 — End: 2016-06-08
  Administered 2016-06-05 – 2016-06-06 (×3): 50 mg via ORAL
  Filled 2016-06-05 (×5): qty 1

## 2016-06-05 MED ORDER — SODIUM CHLORIDE 0.9% FLUSH: 3.0000 mL | Freq: Two times a day (BID) | INTRAVENOUS | Status: DC

## 2016-06-05 MED ORDER — SODIUM CHLORIDE 0.9 % IV SOLN: 250.0000 mL | INTRAVENOUS | Status: DC | PRN

## 2016-06-05 MED ORDER — AMLODIPINE BESYLATE 5 MG PO TABS: 5.0000 mg | ORAL_TABLET | Freq: Every day | ORAL | Status: DC

## 2016-06-05 MED ORDER — DILTIAZEM HCL 100 MG IV SOLR
5.0000 mg/h | INTRAVENOUS | Status: DC
  Filled 2016-06-05: qty 100

## 2016-06-05 MED ORDER — METOPROLOL TARTRATE 100 MG PO TABS: 100.0000 mg | ORAL_TABLET | Freq: Every day | ORAL | Status: DC

## 2016-06-05 MED ORDER — HEPARIN SODIUM (PORCINE) 1000 UNIT/ML IJ SOLN
INTRAMUSCULAR | Status: AC
  Filled 2016-06-05: qty 1

## 2016-06-05 MED ORDER — NICOTINE 14 MG/24HR TD PT24
14.0000 mg | MEDICATED_PATCH | Freq: Every day | TRANSDERMAL | Status: DC
  Administered 2016-06-05 – 2016-06-08 (×4): 14 mg via TRANSDERMAL
  Filled 2016-06-05 (×5): qty 1

## 2016-06-05 MED ORDER — HEPARIN (PORCINE) IN NACL 2-0.9 UNIT/ML-% IJ SOLN
INTRAMUSCULAR | Status: AC
  Filled 2016-06-05: qty 1000

## 2016-06-05 MED ORDER — PEG 3350-KCL-NA BICARB-NACL 420 G PO SOLR
420.0000 g | Freq: Once | ORAL | Status: DC
  Filled 2016-06-05: qty 4000

## 2016-06-05 MED ORDER — SODIUM CHLORIDE 0.9 % IV SOLN: INTRAVENOUS | Status: DC

## 2016-06-05 MED ORDER — LIDOCAINE HCL (PF) 1 % IJ SOLN
INTRAMUSCULAR | Status: AC
Start: 2016-06-05 — End: 2016-06-05
  Filled 2016-06-05: qty 30

## 2016-06-05 MED ORDER — PNEUMOCOCCAL VAC POLYVALENT 25 MCG/0.5ML IJ INJ
0.5000 mL | INJECTION | INTRAMUSCULAR | Status: AC
  Administered 2016-06-07: 0.5 mL via INTRAMUSCULAR
  Filled 2016-06-05: qty 0.5

## 2016-06-05 MED ORDER — SODIUM BICARBONATE 8.4 % IV SOLN: Freq: Once | INTRAVENOUS | Status: DC | PRN

## 2016-06-05 MED ORDER — SODIUM BICARBONATE 8.4 % IV SOLN: INTRAVENOUS | Status: DC | PRN

## 2016-06-05 MED ORDER — HEPARIN (PORCINE) IN NACL 100-0.45 UNIT/ML-% IJ SOLN
900.0000 [IU]/h | INTRAMUSCULAR | Status: DC
  Administered 2016-06-05: 550 [IU]/h via INTRAVENOUS
  Administered 2016-06-06: 700 [IU]/h via INTRAVENOUS
  Filled 2016-06-05 (×4): qty 250

## 2016-06-05 MED ORDER — SODIUM CHLORIDE 0.9% FLUSH: 3.0000 mL | INTRAVENOUS | Status: DC | PRN

## 2016-06-05 MED ORDER — SODIUM CHLORIDE 0.9% FLUSH
3.0000 mL | Freq: Two times a day (BID) | INTRAVENOUS | Status: DC
  Administered 2016-06-06 – 2016-06-08 (×4): 3 mL via INTRAVENOUS

## 2016-06-05 MED ORDER — ONDANSETRON HCL 4 MG/2ML IJ SOLN: 4.0000 mg | Freq: Four times a day (QID) | INTRAMUSCULAR | Status: DC | PRN

## 2016-06-05 NOTE — Progress Notes (Signed)
EKG from 06/05/2016 14:44 showed likely atrial flutter waves, vr 121 (aflutter w var conduction). Baseline artifact and presence of artifacts noted. No evidence of ST elevation.   It appears that pt goes in and out of atrial fibrillation/A flutter in a matter of seconds or minutes at a time. Patient is completely asymptomatic. Otherwise, blood pressure stable. Due to concern of heart rate dropping into the 40s when she is in sinus, we can withhold Cardizem for now.  If patient develops episode of sustained A. fib with RVR necessitating the need for rate control medications, and if subsequently, patient develops significant sustained symptomatic bradycardia, that may point to  Sinus node dysfunction and will require evaluation for pacemaker placement. However, patient is extremely frail with multiple issues going on for she will likely be a poor candidate for anything invasive.

## 2016-06-05 NOTE — Consult Note (Signed)
[facility] Vascular Consult Note  MRN : [id number]  Lydia [name] is a 82 y.o. (20-Dec-1927) female who presents with chief complaint of No chief complaint on file. Marland Kitchen  History of Present Illness: I am asked to evaluate the patient by Dr. Bridgett Larsson for mesenteric ischemic symptoms associated with left lower extremity ischemia in conjunction with atrial fibrillation. The patient is an 82 year old woman who was recently in the office and noted to have subtotal occlusion of the SMA as well as occlusion of the left superficial femoral artery. The symptoms she was experiencing did correlate with these 2 lesions and the plan was made for angiography and intervention.  This morning she was found to be in atrial fibrillation with rapid ventricular response. She has subtotally been admitted and started on Cardizem as well as heparin. I discussed this situation with cardiology and when she has been stabilized I will then reschedule her intervention.  Current Facility-Administered Medications  Medication Dose Route Frequency Provider Last Rate Last Dose  . 0.9 %  sodium chloride infusion   Intravenous Continuous Katha Cabal, MD 75 mL/hr at 06/05/16 0747    . acetaminophen (TYLENOL) tablet 650 mg  650 mg Oral Q6H PRN Demetrios Loll, MD       Or  . acetaminophen (TYLENOL) suppository 650 mg  650 mg Rectal Q6H PRN Demetrios Loll, MD      . albuterol (PROVENTIL) (2.5 MG/3ML) 0.083% nebulizer solution 2.5 mg  2.5 mg Nebulization Q2H PRN Demetrios Loll, MD      . heparin 1000 UNIT/ML injection           . heparin ADULT infusion 100 units/mL (25000 units/214mL sodium chloride 0.45%)  550 Units/hr Intravenous Continuous Demetrios Loll, MD 5.5 mL/hr at 06/05/16 1048 550 Units/hr at 06/05/16 1048  . metoprolol (LOPRESSOR) tablet 50 mg  50 mg Oral BID Rogelia Mire, NP   50 mg at 06/05/16 1542  . nicotine (NICODERM CQ - dosed in mg/24 hours) patch 14 mg  14 mg Transdermal Daily Demetrios Loll, MD   14 mg at  06/05/16 1542  . ondansetron (ZOFRAN) tablet 4 mg  4 mg Oral Q6H PRN Demetrios Loll, MD       Or  . ondansetron [facility]) injection 4 mg  4 mg Intravenous Q6H PRN Demetrios Loll, MD      . pantoprazole (PROTONIX) EC tablet 40 mg  40 mg Oral Daily Demetrios Loll, MD   40 mg at 06/05/16 1542  . [START ON 06/06/2016] pneumococcal 23 valent vaccine (PNU-IMMUNE) injection 0.5 mL  0.5 mL Intramuscular Tomorrow-1000 Demetrios Loll, MD      . sodium chloride flush (NS) 0.9 % injection 3 mL  3 mL Intravenous Q12H Demetrios Loll, MD   3 mL at 06/05/16 1000    Past Medical History  Diagnosis Date  . Essential hypertension   . Osteoporosis   . Lower back pain   . History of stomach ulcers     a. s/p surgery ~ 40 yrs ago.  . Polycythemia     a. phlebotomy.  Marland Kitchen COPD (chronic obstructive pulmonary disease) ([place])   . IDA (iron deficiency anemia)     a. 04/2016 CT Abd: no malignancy.  . Weight loss, unintentional   . Thyroid nodule     a. 04/2016 large left thyroid nodule on CT chest  - rec dedicated u/s.  . Pulmonary nodule, left     a. 04/2016 CT chest: 2mm LUL nodule - f/u  CT in 12 months (h/o smoking).  Marland Kitchen PAD (peripheral artery disease) ([place])     a. 04/2016 CT Abd: occluded prox LSFA.  . Superior mesenteric artery stenosis ([place])     a. 04/2016 CT Abd: atheromatous plaque and mural thrombus in SMA.  . Tobacco abuse     Past Surgical History  Procedure Laterality Date  . Blepharoplasty    . Stomach surgery  1995    for ulcers  . Colonoscopy    . Cataract extraction w/phaco Right 01/27/2016    Procedure: CATARACT EXTRACTION PHACO AND INTRAOCULAR LENS PLACEMENT (IOC);  Surgeon: Ronnell Freshwater, MD;  Location: [facility];  Service: Ophthalmology;  Laterality: Right;  . Colonoscopy  2005, 2010    Social History Social History  Substance Use Topics  . Smoking status: Current Every Day Smoker -- 1.00 packs/day for 65 years    Types: Cigarettes  . Smokeless tobacco: Never Used  . Alcohol Use: No     Family History Family History  Problem Relation Age of Onset  . Breast cancer Sister   . Other      no premature CAD  No family history bleeding clotting disorders, porphyria or autoimmune disease.  Allergies  Allergen Reactions  . Penicillins Hives     REVIEW OF SYSTEMS (Negative unless checked)  Constitutional: [x] Weight loss  [] Fever  [] Chills Cardiac: [] Chest pain   [] Chest pressure   [] Palpitations   [] Shortness of breath when laying flat   [] Shortness of breath at rest   [] Shortness of breath with exertion. Vascular:  [x] Pain in legs with walking   [] Pain in legs at rest   [x] Pain in legs when laying flat   [x] Claudication   [x] Pain in feet when walking  [x] Pain in feet at rest  [] Pain in feet when laying flat   [] History of DVT   [] Phlebitis   [x] Swelling in legs   [] Varicose veins   [] Non-healing ulcers Pulmonary:   [] Uses home oxygen   [] Productive cough   [] Hemoptysis   [] Wheeze  [] COPD   [] Asthma Neurologic:  [] Dizziness  [] Blackouts   [] Seizures   [] History of stroke   [] History of TIA  [] Aphasia   [] Temporary blindness   [] Dysphagia   [] Weakness or numbness in arms   [] Weakness or numbness in legs Musculoskeletal:  [] Arthritis   [] Joint swelling   [] Joint pain   [] Low back pain Hematologic:  [] Easy bruising  [] Easy bleeding   [] Hypercoagulable state   [] Anemic  [] Hepatitis Gastrointestinal:  [] Blood in stool   [] Vomiting blood  [] Gastroesophageal reflux/heartburn   [] Difficulty swallowing. Genitourinary:  [] Chronic kidney disease   [] Difficult urination  [] Frequent urination  [] Burning with urination   [] Blood in urine Skin:  [] Rashes   [] Ulcers   [] Wounds Psychological:  [] History of anxiety   []  History of major depression.    Physical Examination  Filed Vitals:   06/05/16 0910 06/05/16 1023 06/05/16 1213 06/05/16 1506  BP: 134/79 124/67 144/75 108/75  Pulse:   50 87  Temp:   97.6 F (36.4 C)   TempSrc:   Oral   Resp: 24 23 16    Height:      Weight:       SpO2: 96% 97% 92%    Body mass index is 14.21 kg/(m^2).  Head: [place]/AT, Marked temporalis wasting. Prominent temp pulse not noted. Ear/Nose/Throat: Nares w/o erythema or drainage, oropharynx w/o obsrtuction, Mallampati score: Class II.  Dentition dentures.  Eyes: PERRLA, Sclera nonicteric.  Neck: Supple, no nuchal rigidity.  No bruit or JVD.  Pulmonary:  Breath sounds equal bilaterally, no use of accessory muscles.  Cardiac: RRR, normal S1, S2, no Murmurs, rubs or gallops. Vascular: 2+ femoral pulses bilaterally. Popliteal and pedal pulses are nonpalpable. Gastrointestinal: Cachectic soft, non-tender, non-distended.  Musculoskeletal: Moves all extremities.  No deformity or atrophy. 3+ edema left foot. Neurologic: CN 2-12 intact. Symmetrical.  Speech is fluent.  Psychiatric: Judgment intact, Mood & affect appropriate for pt's clinical situation. Dermatologic: No rashes or ulcers noted.  No cellulitis or open wounds. Lymph : No Cervical,  or Inguinal lymphadenopathy.      CBC Lab Results  Component Value Date   WBC 6.8 06/05/2016   HGB 15.4 06/05/2016   HCT 46.5 06/05/2016   MCV 83.6 06/05/2016   PLT 305 06/05/2016    BMET    Component Value Date/Time   NA 138 06/05/2016 0705   NA 140 11/08/2014 1341   K 4.1 06/05/2016 0705   K 3.7 11/08/2014 1341   CL 98* 06/05/2016 0705   CL 103 11/08/2014 1341   CO2 32 06/05/2016 0705   CO2 31 11/08/2014 1341   GLUCOSE 105* 06/05/2016 0705   GLUCOSE 126* 11/08/2014 1341   BUN 24* 06/05/2016 0705   BUN 13 11/08/2014 1341   CREATININE 0.78 06/05/2016 0705   CREATININE 0.79 11/08/2014 1341   CALCIUM 8.9 06/05/2016 0705   CALCIUM 7.8* 11/08/2014 1341   GFRNONAA >60 06/05/2016 0705   GFRNONAA >60 11/08/2014 1341   GFRNONAA >60 02/03/2013 1047   GFRAA >60 06/05/2016 0705   GFRAA >60 11/08/2014 1341   GFRAA >60 02/03/2013 1047   Estimated Creatinine Clearance: 31.8 mL/min (by C-G formula based on Cr of 0.78).  COAG No results  found for: INR, PROTIME  Assessment/Plan Mesenteric ischemia Cancel angioplasty today initiate anticoagulation. We will follow and when the patient demonstrates stability we will move forward with angiography and intervention to treat her superior mesenteric lesion as well as her left lower extremity lesions  Dehydration. Encourage oral fluid intake and a follow-up BMP.  A fib with RVR,  Admit to tele floor. Start cardizem drip and heparin drip, echo and cardiology consult.  HTN continue norvasc and lopressor.  COPD. Stable,  NEB prn.  Polycythemia Oncology consult.  Malnutrition.  Dietitian consult.   Lacrecia Delval, Dolores Lory, MD  06/05/2016 5:54 PM

## 2016-06-05 NOTE — OR Nursing (Signed)
Dr schnier notified of rapid afib new onset. Paged hospitalist to see

## 2016-06-05 NOTE — H&P (Signed)
[facility] [place] NAME: Lydia [name]    MR#:  [id number]  DATE OF BIRTH:  [birth year]  DATE OF ADMISSION:  06/05/2016  PRIMARY CARE PHYSICIAN: Marinda Elk, MD   REQUESTING/REFERRING PHYSICIAN: Dr. Delana Meyer.  CHIEF COMPLAINT:  No chief complaint on file.  A. fib with RVR at 160 this morning. HISTORY OF PRESENT ILLNESS:  Lydia [name]  is a 82 y.o. female with a known history of Hypertension, COPD, iron deficiency anemia and polycythemia. The patient has had a severe weight loss at about 50 pound with uncertain etiology. The CAT scan of abdomen was opened and there was concern about mesenteric ischemia. She also noticed a rest pain of the left leg. But she denies any chest pain, palpitation, orthopnea or nocturnal dyspnea or leg edema. She only complains of nausea but denies any abdominal pain, vomiting or diarrhea. No melena or bloody stool or hematuria. She was found to have A. fib with RVR at 160 before planned angioplasty just now. She was given Cardizem 10 mg IV but the patient is a still in A. fib with RVR at about 110 to 130. Dr. Delana Meyer requested medical consult for admission.  PAST MEDICAL HISTORY:   Past Medical History  Diagnosis Date  . Hypertension   . Osteoporosis   . Lower back pain   . History of stomach ulcers     40 yrs ago  . Polycythemia   . COPD (chronic obstructive pulmonary disease) ([place])   . IDA (iron deficiency anemia)     PAST SURGICAL HISTORY:   Past Surgical History  Procedure Laterality Date  . Blepharoplasty    . Stomach surgery  1995    for ulcers  . Colonoscopy    . Cataract extraction w/phaco Right 01/27/2016    Procedure: CATARACT EXTRACTION PHACO AND INTRAOCULAR LENS PLACEMENT (IOC);  Surgeon: Ronnell Freshwater, MD;  Location: [facility];  Service: Ophthalmology;  Laterality: Right;  . Colonoscopy  2005, 2010    SOCIAL HISTORY:   Social History  Substance Use  Topics  . Smoking status: Current Every Day Smoker -- 1.00 packs/day for 65 years    Types: Cigarettes  . Smokeless tobacco: Never Used  . Alcohol Use: No    FAMILY HISTORY:   Family History  Problem Relation Age of Onset  . Breast cancer Sister     DRUG ALLERGIES:   Allergies  Allergen Reactions  . Penicillins Hives    REVIEW OF SYSTEMS:  CONSTITUTIONAL: No fever, Has Generalized weakness and feels cold.  EYES: No blurred or double vision.  EARS, NOSE, AND THROAT: No tinnitus or ear pain.  RESPIRATORY: No cough, shortness of breath, wheezing or hemoptysis.  CARDIOVASCULAR: No chest pain, orthopnea, edema.  GASTROINTESTINAL: Has nausea, no vomiting, diarrhea or abdominal pain. No melena or bloody stool. GENITOURINARY: No dysuria, hematuria.  ENDOCRINE: No polyuria, nocturia,  HEMATOLOGY: No anemia, easy bruising or bleeding SKIN: No rash or lesion.  MUSCULOSKELETAL: Left leg pain.   NEUROLOGIC: No tingling, numbness, weakness.  PSYCHIATRY: No anxiety or depression.   MEDICATIONS AT HOME:   Prior to Admission medications   Medication Sig Start Date End Date Taking? Authorizing Provider  amLODipine (NORVASC) 5 MG tablet Take 5 mg by mouth daily. AM   Yes Historical Provider, MD  Ascorbic Acid (VITAMIN C) 1000 MG tablet Take 1,000 mg by mouth daily.   Yes Historical Provider, MD  Cholecalciferol (VITAMIN D PO) Take 600 mg  by mouth daily. AM   Yes Historical Provider, MD  metoprolol (LOPRESSOR) 100 MG tablet Take 100 mg by mouth daily. AM   Yes Historical Provider, MD  polyethylene glycol-electrolytes (NULYTELY/GOLYTELY) 420 g solution Take 420 g by mouth once.   Yes Historical Provider, MD  vitamin A 10000 UNIT capsule Take 10,000 Units by mouth daily.   Yes Historical Provider, MD  CALCIUM PO Take by mouth daily. AM    Historical Provider, MD  omeprazole (PRILOSEC) 20 MG capsule  04/30/16   Historical Provider, MD      VITAL SIGNS:  Blood pressure 130/75, pulse 94,  temperature 97.6 F (36.4 C), temperature source Oral, resp. rate 22, height 5\' 6"  (1.676 m), weight 88 lb (39.917 kg), SpO2 96 %.  PHYSICAL EXAMINATION:  GENERAL:  82 y.o.-year-old patient lying in the bed with no acute distress. Very thin, malnutrition. EYES: Pupils equal, round, reactive to light and accommodation. No scleral icterus. Extraocular muscles intact.  HEENT: Head atraumatic, normocephalic. Oropharynx and nasopharynx clear.  NECK:  Supple, no jugular venous distention. No thyroid enlargement, no tenderness.  LUNGS: Normal breath sounds bilaterally, no wheezing, rales,rhonchi or crepitation. No use of accessory muscles of respiration.  CARDIOVASCULAR: S1, S2 normal. No murmurs, rubs, or gallops.  ABDOMEN: Soft, nontender, nondistended. Bowel sounds present. No organomegaly or mass.  EXTREMITIES: Trace left leg edema, no cyanosis, or clubbing. No pedal pulses on both sides. NEUROLOGIC: Cranial nerves II through XII are intact. Muscle strength 5/5 in all extremities. Sensation intact. Gait not checked.  PSYCHIATRIC: The patient is alert and oriented x 3.  SKIN: No obvious rash, lesion, or ulcer. But has mild ecchymosis on both feet (the patient said it is there long time.). No bruises.  LABORATORY PANEL:   CBC  Recent Labs Lab 06/05/16 0705  WBC 6.8  HGB 15.4  HCT 46.5  PLT 305   ------------------------------------------------------------------------------------------------------------------  Chemistries   Recent Labs Lab 06/05/16 0705  NA 138  K 4.1  CL 98*  CO2 32  GLUCOSE 105*  BUN 24*  CREATININE 0.78  CALCIUM 8.9   ------------------------------------------------------------------------------------------------------------------  Cardiac Enzymes No results for input(s): TROPONINI in the last 168 hours. ------------------------------------------------------------------------------------------------------------------  RADIOLOGY:  No results  found.  EKG:   Orders placed or performed during the hospital encounter of 06/05/16  . EKG 12-Lead  . EKG 12-Lead  . EKG 12-Lead  . EKG 12-Lead  . EKG 12-Lead  . EKG 12-Lead    IMPRESSION AND PLAN:   New on set A fib with RVR,  Admit to tele floor. Start cardizem drip and heparin drip, echo and cardiology consult.  Mesenteric ischemia Cancel angioplasty and agrees to start heparin drip per Dr. Delana Meyer.  Dehydration. Encourage oral fluid intake and a follow-up BMP.  HTN, continue norvasc and lopressor.  COPD. Stable, NEB prn.  Polycythemia Oncology consult.  Malnutrition. Dietitian consult.  Tobacco abuse. Smoking cessation was counseled for 3-4 minutes, nicotine patch.   I discussed with Dr. Delana Meyer and cardiologist on call. All the records are reviewed and case discussed with ED provider. Management plans discussed with the patient, her husband and they are in agreement.  CODE STATUS: DNR  TOTAL TIME TAKING CARE OF THIS PATIENT: 65 minutes.    Demetrios Loll M.D on 06/05/2016 at 9:19 AM  Between 7am to 6pm - Pager - [phone]  After 6pm go to [email or web address] - password [email or web address] [facility]  [facility]  Office  [phone]  CC: Primary care physician; [facility], MIRIAM K,  MD

## 2016-06-05 NOTE — Consult Note (Signed)
History & Physical    Patient ID: Lydia [name] MRN: [id number], DOB/AGE: Aug 19, 1927   Admit date: 06/05/2016   Primary Physician: Marinda Elk, MD Primary Cardiologist: New - seen by E. Yvone Neu, MD  Patient Profile    39 y /o ? with a h/o HTN, polycythemia, IDA, tobacco abuse, bilat claudication, PAD, and unintentional weight loss who presented today for peripheral angiography and was found to be in rapid atrial fibrillation.  Past Medical History    Past Medical History  Diagnosis Date  . Essential hypertension   . Osteoporosis   . Lower back pain   . History of stomach ulcers     a. s/p surgery ~ 40 yrs ago.  . Polycythemia     a. phlebotomy.  Marland Kitchen COPD (chronic obstructive pulmonary disease) ([place])   . IDA (iron deficiency anemia)     a. 04/2016 CT Abd: no malignancy.  . Weight loss, unintentional   . Thyroid nodule     a. 04/2016 large left thyroid nodule on CT chest  - rec dedicated u/s.  . Pulmonary nodule, left     a. 04/2016 CT chest: 35mm LUL nodule - f/u CT in 12 months (h/o smoking).  Marland Kitchen PAD (peripheral artery disease) ([place])     a. 04/2016 CT Abd: occluded prox LSFA.  . Superior mesenteric artery stenosis ([place])     a. 04/2016 CT Abd: atheromatous plaque and mural thrombus in SMA.  . Tobacco abuse     Past Surgical History  Procedure Laterality Date  . Blepharoplasty    . Stomach surgery  1995    for ulcers  . Colonoscopy    . Cataract extraction w/phaco Right 01/27/2016    Procedure: CATARACT EXTRACTION PHACO AND INTRAOCULAR LENS PLACEMENT (IOC);  Surgeon: Ronnell Freshwater, MD;  Location: [facility];  Service: Ophthalmology;  Laterality: Right;  . Colonoscopy  2005, 2010    Allergies  Allergies  Allergen Reactions  . Penicillins Hives    History of Present Illness    82 y/o ? with a h/o tobacco abuse, HTN, polycythemia, iron def anemia, claudication, unintentinoal weight loss, and remote GIB in the setting of PUD with surgery ~ 40  yrs ago.  She has no prior cardiac history.  She lives locally with her husband and says that she was relatively active up until about 6 mos ago.  She has chronic lower back pain, which limits her some, but over the past few mos, she has been experiencing progressive L > R lower extremity claudication.  This is worst in the left lateral hip/buttocks, thigh, and calf.  She has some degree of chronic DOE, though this has been stable and she has no h/o chest pain.    She has also been losing weight unintentionally and recently underwent CTA of the chest and abdomen without finding of malignancy to explain wt loss, but with findings of thyroid nodule (previously known), lung nodules, atherosclerotic aortic and coronary dzs, SMA dzs, and an occluded L SFA.  With claudication Ss and PAD, she was seen by vascular surgery and subsequently set up for peripheral angiography today.  Upon presentation, she was found to be in asymptomatic AF with RVR with rates into the 140's.  Her procedure was cancelled and she was given a 10 mg dilt bolus followed by infusion.  She has since been in and out of AFib/flutter and has remained asymptomatic.  Pauses up to 3.97 seconds have been noted on tele while in  flutter, though no post-termination pauses have been noted.  Pt has no h/o palpitations, presyncope, or syncope.   Home Medications    Prior to Admission medications   Medication Sig Start Date End Date Taking? Authorizing Provider  amLODipine (NORVASC) 5 MG tablet Take 5 mg by mouth daily. AM   Yes Historical Provider, MD  Ascorbic Acid (VITAMIN C) 1000 MG tablet Take 1,000 mg by mouth daily.   Yes Historical Provider, MD  Cholecalciferol (VITAMIN D PO) Take 600 mg by mouth daily. AM   Yes Historical Provider, MD  metoprolol (LOPRESSOR) 100 MG tablet Take 100 mg by mouth daily. AM   Yes Historical Provider, MD  polyethylene glycol-electrolytes (NULYTELY/GOLYTELY) 420 g solution Take 420 g by mouth once.   Yes Historical  Provider, MD  vitamin A 10000 UNIT capsule Take 10,000 Units by mouth daily.   Yes Historical Provider, MD  CALCIUM PO Take by mouth daily. AM    Historical Provider, MD  omeprazole (PRILOSEC) 20 MG capsule  04/30/16   Historical Provider, MD    Family History    Family History  Problem Relation Age of Onset  . Breast cancer Sister   . Other      no premature CAD    Social History    Social History   Social History  . Marital Status: Married    Spouse Name: N/A  . Number of Children: N/A  . Years of Education: N/A   Occupational History  . Not on file.   Social History Main Topics  . Smoking status: Current Every Day Smoker -- 1.00 packs/day for 65 years    Types: Cigarettes  . Smokeless tobacco: Never Used  . Alcohol Use: No  . Drug Use: No  . Sexual Activity: Not on file   Other Topics Concern  . Not on file   Social History Narrative   Lives locally with husband.  Does not routinely exercise.  Activity limited by progressive L>R LE claudication.     Review of Systems    General:  No chills, fever, night sweats or weight changes.  Cardiovascular:  No chest pain, chronic DOE, edema, orthopnea, palpitations, paroxysmal nocturnal dyspnea.  She has L>R bilateral LE claudication involving the hips/buttocks/thighs/calves. Dermatological: No rash, lesions/masses Respiratory: No cough, +++ chronic dyspnea on exertion. Urologic: No hematuria, dysuria Abdominal:   No nausea, vomiting, diarrhea, bright red blood per rectum, melena, or hematemesis Neurologic:  No visual changes, wkns, changes in mental status. All other systems reviewed and are otherwise negative except as noted above.  Physical Exam    Blood pressure 124/67, pulse 94, temperature 97.6 F (36.4 C), temperature source Oral, resp. rate 23, height 5\' 6"  (1.676 m), weight 39.917 kg (88 lb), SpO2 97 %.  General: Pleasant, NAD, cachectic  Psych: Flat affect. Neuro: Alert and oriented X 3. Moves all  extremities spontaneously. HEENT: Normal  Neck: Supple without bruits or JVD. Lungs:  Resp regular and unlabored, markedly diminished breath sounds bilaterally. Heart: IR, IR< distant, no s3, s4, or murmurs. Abdomen: Soft, non-tender, non-distended, BS + x 4.  Extremities: No clubbing, cyanosis or edema. DP/PT/Radials 1+ and equal bilaterally.  Labs    Lab Results  Component Value Date   WBC 6.8 06/05/2016   HGB 15.4 06/05/2016   HCT 46.5 06/05/2016   MCV 83.6 06/05/2016   PLT 305 06/05/2016     Recent Labs Lab 06/05/16 0705  NA 138  K 4.1  CL 98*  CO2 32  BUN 24*  CREATININE 0.78  CALCIUM 8.9  GLUCOSE 105*   Lab Results  Component Value Date   CHOL 175 11/08/2014   HDL 68* 11/08/2014   LDLCALC 85 11/08/2014   TRIG 109 11/08/2014    Radiology Studies    Ct Chest W Contrast/Ct Abdomen Pelvis W Contrast  05/07/2016  CLINICAL DATA:  Weight loss, 40 pounds in 1 year. Early satiety. Remote gastric ulcer, repaired. Long-term tobacco use. EXAM: CT CHEST, ABDOMEN, AND PELVIS WITH CONTRAST TECHNIQUE: Multidetector CT imaging of the chest, abdomen and pelvis was performed following the standard protocol during bolus administration of intravenous contrast. CONTRAST:  64mL ISOVUE-300 IOPAMIDOL (ISOVUE-300) INJECTION 61% COMPARISON:  Multiple exams, including 04/04/2010 and 04/14/2016 FINDINGS: CT CHEST FINDINGS Mediastinum/Nodes: Enlarged left thyroid lobe with a 3.1 by 2.2 cm nodule with central calcification and heterogeneous enhancement extending posterior to the trachea in the upper mediastinum, and effacing and displacing the esophagus to the right. Coronary, aortic arch, and branch vessel atherosclerotic vascular disease. Ectatic pulmonary artery and ectatic thoracic aorta. Calcifications along the mitral and aortic valve. No pathologic thoracic adenopathy. Lungs/Pleura: Biapical pleural parenchymal scarring. Severe centrilobular emphysema. 6 by 4 mm indistinct subpleural nodule  along the as ago esophageal recess, image 87/4. 7 by 4 mm left upper lobe subpleural pulmonary nodule medially on image 28/4. Mild peripheral bronchiectasis and airway plugging distally in the lingula. Musculoskeletal: Minimal superior endplate concavity at the T3 vertebral level likely from a remote compression fracture. Cachexia. CT ABDOMEN PELVIS FINDINGS Hepatobiliary: Upper normal common bile duct size at 9 mm diameter. Pancreas: Borderline dilated dorsal pancreatic duct in the pancreatic head. Spleen: 1.5 by 1.4 by 1.9 cm hypodense lesion of the anterior spleen, internal density 102 Hounsfield units postcontrast. Partially exophytic. On delayed images this has an internal density of 113 Hounsfield units, similar to the surrounding spleen. Adrenals/Urinary Tract: Indistinct adrenal glands but no discrete adrenal mass. No hydronephrosis or hydroureter. The ureters are difficult to follow due to the underlying cachexia and indistinctness of surrounding fat planes. No parenchymal lesion of the kidneys. Urinary bladder unremarkable. Stomach/Bowel: No dilated bowel. Paucity of intra-abdominal adipose tissue aching separation of adjacent structures problematic. Vascular/Lymphatic: Aortoiliac atherosclerotic vascular disease. There is some atheromatous plaque and mural thrombus in the superior mesenteric artery which is otherwise patent. Bilateral single renal arteries appear patent than the IMA appears patent. The left superficial femoral artery is occluded proximally although the deep branch is patent. I do not see definite retroperitoneal adenopathy. Reproductive: Retroverted uterus with multiple large calcified masses compatible with fibroids. Difficult to discern the ovaries separate from the uterus. Other: Diffuse subcutaneous, mesenteric, and retroperitoneal edema/stranding which in combination with the general cachexia tends to make adjacent structures indistinct. Musculoskeletal: Levoconvex scoliosis at the  thoracolumbar junction with dextroconvex scoliosis in the lumbar spine. Intervertebral disc narrowing and endplate sclerosis. Prominent axial loss of articular space in both hips. There is left foraminal stenosis at the L4-5 level along with central narrowing of the thecal sac. IMPRESSION: 1. A specific cause for the patient' s weight loss is not identified. No obvious active malignancy. 2. Sensitivity is adversely affected by the patient's underlying cachexia along with indistinct stranding in the subcutaneous, mesenteric, and retroperitoneal tissues making separation of adjacent structures problematic. 3. There is a large nodule of the left thyroid gland extending posteriorly into the upper mediastinum. Consider thyroid ultrasound for further characterization. This has dense central calcification. 4. Severe emphysema. 5. Coronary, aortic arch, and branch vessel atherosclerotic vascular disease.  6. 5 mm nodule in the as ago esophageal recess and 5 mm nodule in the left upper lobe. No follow-up needed if patient is low-risk (and has no known or suspected primary neoplasm). Non-contrast chest CT can be considered in 12 months if patient is high-risk. This recommendation follows the consensus statement: Guidelines for Management of Incidental Pulmonary Nodules Detected on CT Images:From the Fleischner Society 2017; published online before print (10.1148/radiol.IJ:2314499). 7. 1.9 cm hypodense lesion in the anterior spleen is technically nonspecific. This demonstrates diffuse delayed enhancement similar to the rest of the spleen but is hypoenhancing on portal venous phase images. This could be an atypical hemangioma and may warrant observation. 8. Aortoiliac atherosclerotic vascular disease. Occluded proximal left superficial femoral artery although the deep branch is patent. 9. Multiple large calcified uterine fibroids. 10. Thoracolumbar scoliosis along with spondylosis and degenerative disc disease causing  impingement at L4-5. 11. Mitral and aortic valve calcifications. 12. Peripheral bronchiectasis and airway plugging in the lingula. Electronically Signed   By: Van Clines M.D.   On: 05/07/2016 17:00    ECG & Cardiac Imaging    Afib/flutter, 134, delayed R progression, non-specific ST/T changes.  Assessment & Plan    1.  Afib/flutter with RVR:  Pt presented today for elective peripheral angiography related to L>R LE claudication and recent finding of LSFA occlusion, and was incidentally noted to be in rapid afib/flutter.  She has no prior known h/o AFib and denies any h/o palpitations.  She does have some degree of chronic DOE, but this has not worsened recently.  HR's were normal during oncology office visits on 4/20, 4/28, and 5/19.  Rates this morning were into the 140's at times and since being placed on IV dilt, she has had pauses of up to 3.97 seconds, and has also been in and out of AF with rates in the mid-50's while in sinus.  No post-termination pauses noted.  She is completely asymptomatic.  She is being admitted by internal medicine.  As she is paroxysmal, there is no role for DCCV @ this time and I suspect that we can transition her to oral diltiazem.  She is also on lopressor 100 mg daily @ home, which we can change to 50 BID for better 24 hr coverage.  With h/o COPD and markedly diminished breath sounds, I'm not sure that she'd tolerate higher  blocker doses.  CHA2DS2VASc = 5 in the setting of HTN, age x 2, vascular dzs, and female gender.  I've discussed her case with vascular surgery, and Dr. Delana Meyer is hopeful to perform angiography on Tuesday.  In that setting, we will not initiate [place] @ this time and instead use IV heparin but if she were to go home over the weekend, which she likely could provided that we achieve adequate rate control, we could start [facility] and have it held prior to her procedure.  Pt is willing to be placed on eliquis (would have to be 2.5 mg bid in setting of age  and wt).  Her husband previously had a bad experience with Xarelto.  Pt does have a remote h/o GIB in the setting of PUD ~ 40 yrs ago.  No issues since.  Check echo and TSH.  2.  PAD/L>R Claudication:  Pending peripheral angio as above.  Would check lipids and consider adding statin - ? Utility in 82 y/o however.  3.  Essential HTN:  Stable. Follow on  blocker and dilt.  4.  COPD/ongoing tobacco abuse:  Still  smoking 1ppd.  She is not intending to quit.  Diminished breath sounds but no active wheezing.  Smoking cessation advised.  5.  Lung Nodules:  Needs f/u CT in a year per radiology recs.  6.  Thyroid Nodule:  F/u TSH in setting of #1.  7.  Polycythemia/IDA:  H/H stable.  Followed by oncology as outpt.  Signed, Murray Hodgkins, NP 06/05/2016, 11:41 AM

## 2016-06-05 NOTE — Progress Notes (Signed)
Patient alert and oriented x4. Oriented to room, unit, and call bell. No complaints at this time. Will cont to assess. Skin assessment verified by Venancio Poisson, RN. Telemetry box verified with [place], NT. Clarified golytely with Dr. Bridgett Larsson, this is to be d/c'd. Wilnette Kales

## 2016-06-05 NOTE — Progress Notes (Signed)
Dr. Yvone Neu reviewed EKG strip and does not think ST elevation is accurate, only that patient is in and out of afib. Per Dr. Yvone Neu d/c cardizem and only give metoprolol at this time. Continue to monitor HR. Lydia [name]

## 2016-06-05 NOTE — Progress Notes (Signed)
EKG showed what may be ST elevation, Dr, Farrel Conners notified and will review strip. Patient has no complaints of chest pain and vital signs are stable. Waiting for further instruction from Dr. Farrel Conners. Wilnette Kales

## 2016-06-05 NOTE — Progress Notes (Signed)
ANTICOAGULATION CONSULT NOTE - Initial Consult  Pharmacy Consult for Heparin Drip Indication: atrial fibrillation  Allergies  Allergen Reactions  . Penicillins Hives    Patient Measurements: Height: 5\' 6"  (167.6 cm) Weight: 88 lb (39.917 kg) IBW/kg (Calculated) : 59.3 Heparin Dosing Weight: 39.9 kg  Vital Signs: Temp: 97.6 F (36.4 C) (06/09 0712) Temp Source: Oral (06/09 0712) BP: 130/75 mmHg (06/09 0811) Pulse Rate: 94 (06/09 0811)  Labs:  Recent Labs  06/05/16 0705  HGB 15.4  HCT 46.5  PLT 305  CREATININE 0.78    Estimated Creatinine Clearance: 31.8 mL/min (by C-G formula based on Cr of 0.78).   Medical History: Past Medical History  Diagnosis Date  . Hypertension   . Osteoporosis   . Lower back pain   . History of stomach ulcers     40 yrs ago  . Polycythemia   . COPD (chronic obstructive pulmonary disease) ([place])   . IDA (iron deficiency anemia)     Medications:  Infusions:  . sodium chloride 75 mL/hr at 06/05/16 0747  . diltiazem (CARDIZEM) infusion    . heparin      Assessment: 83 yo female admitted with new onset a fib  Goal of Therapy:  Heparin level 0.3-0.7 units/ml Monitor platelets by anticoagulation protocol: Yes   Plan:  Give 2000 units bolus x 1 Start heparin infusion at 550 units/hr Check anti-Xa level in 8 hours and daily while on heparin Continue to monitor H&H and platelets  Mykell Genao K 06/05/2016,10:06 AM

## 2016-06-05 NOTE — Progress Notes (Signed)
Initial Nutrition Assessment  DOCUMENTATION CODES:   Severe malnutrition in context of chronic illness  INTERVENTION:  -Recommend regular diet -Pt agreeable to trying [employer] and [employer] supplements as pt does not really luke Ensure/Boost; will send on meal trays. If pt does not like these, will change back to Ensure -Pt would likely benefit from smaller, more frequent meals  NUTRITION DIAGNOSIS:   Malnutrition related to chronic illness as evidenced by severe depletion of muscle mass, severe depletion of body fat, percent weight loss.  GOAL:   Patient will meet greater than or equal to 90% of their needs  MONITOR:   PO intake, Supplement acceptance, Labs, Weight trends  REASON FOR ASSESSMENT:   Consult Assessment of nutrition requirement/status  ASSESSMENT:    82 yo female admitted with new onset afib with RVR, mesenteric ischemia, dehydration   Pt reports she eats 3 meals per day at home although appetite is not good. Pt also reports she drinks 1-2 Boost/Ensure per day although she does not like the taste  Nutrition-Focused physical exam completed. Findings are severe fat depletion, severe muscle depletion, and mild edema.     Past Medical History  Diagnosis Date  . Essential hypertension   . Osteoporosis   . Lower back pain   . History of stomach ulcers     a. s/p surgery ~ 40 yrs ago.  . Polycythemia     a. phlebotomy.  Marland Kitchen COPD (chronic obstructive pulmonary disease) ([place])   . IDA (iron deficiency anemia)     a. 04/2016 CT Abd: no malignancy.  . Weight loss, unintentional   . Thyroid nodule     a. 04/2016 large left thyroid nodule on CT chest  - rec dedicated u/s.  . Pulmonary nodule, left     a. 04/2016 CT chest: 16mm LUL nodule - f/u CT in 12 months (h/o smoking).  Marland Kitchen PAD (peripheral artery disease) ([place])     a. 04/2016 CT Abd: occluded prox LSFA.  . Superior mesenteric artery stenosis ([place])     a. 04/2016 CT Abd: atheromatous plaque  and mural thrombus in SMA.  . Tobacco abuse     Diet Order:  Diet regular Room service appropriate?: Yes; Fluid consistency:: Thin   Energy Intake: pt ate well for lunch today, pt just admitted  Skin:  Reviewed, no issues  Last BM:  no documented BM  Height:   Ht Readings from Last 1 Encounters:  06/05/16 5\' 6"  (1.676 m)    Weight: pt reports recently weight has been stable but 50 pound wt loss over the past year, reports she has not been able to gain any weight back but feels like she has maintained her weight recently; 36% wt loss in 1 year.  Appears weight stable over last few 2 months per weight encounters.   Wt Readings from Last 1 Encounters:  06/05/16 88 lb (39.917 kg)    BMI:  Body mass index is 14.21 kg/(m^2).  Estimated Nutritional Needs:   Kcal:  1475-1770 kcals   Protein:  71-83 g  Fluid:  >/= 1.5 L  EDUCATION NEEDS:   Education needs no appropriate at this time  [place], [place], [place] [phone] Pager  [phone] Weekend/On-Call Pager

## 2016-06-05 NOTE — Progress Notes (Addendum)
ANTICOAGULATION CONSULT NOTE - Initial Consult  Pharmacy Consult for Heparin Drip Indication: atrial fibrillation  Allergies  Allergen Reactions  . Penicillins Hives    Patient Measurements: Height: 5\' 6"  (167.6 cm) Weight: 88 lb (39.917 kg) IBW/kg (Calculated) : 59.3 Heparin Dosing Weight: 39.9 kg  Vital Signs: Temp: 98.1 F (36.7 C) (06/09 1943) Temp Source: Oral (06/09 1943) BP: 132/65 mmHg (06/09 1943) Pulse Rate: 50 (06/09 1943)  Labs:  Recent Labs  06/05/16 0705 06/05/16 1856  HGB 15.4  --   HCT 46.5  --   PLT 305  --   APTT  --  29  LABPROT  --  12.8  INR  --  0.94  HEPARINUNFRC  --  <0.10*  CREATININE 0.78  --     Estimated Creatinine Clearance: 31.8 mL/min (by C-G formula based on Cr of 0.78).   Medical History: Past Medical History  Diagnosis Date  . Essential hypertension   . Osteoporosis   . Lower back pain   . History of stomach ulcers     a. s/p surgery ~ 40 yrs ago.  . Polycythemia     a. phlebotomy.  Marland Kitchen COPD (chronic obstructive pulmonary disease) ([place])   . IDA (iron deficiency anemia)     a. 04/2016 CT Abd: no malignancy.  . Weight loss, unintentional   . Thyroid nodule     a. 04/2016 large left thyroid nodule on CT chest  - rec dedicated u/s.  . Pulmonary nodule, left     a. 04/2016 CT chest: 32mm LUL nodule - f/u CT in 12 months (h/o smoking).  Marland Kitchen PAD (peripheral artery disease) ([place])     a. 04/2016 CT Abd: occluded prox LSFA.  . Superior mesenteric artery stenosis ([place])     a. 04/2016 CT Abd: atheromatous plaque and mural thrombus in SMA.  . Tobacco abuse     Medications:  Infusions:  . sodium chloride 75 mL/hr at 06/05/16 0747  . heparin 550 Units/hr (06/05/16 1048)    Assessment: 82 yo female admitted with new onset a fib. Heparin infusing at 550 units/hr. Heparin level resulted at <0.1.  Goal of Therapy:  Heparin level 0.3-0.7 units/ml Monitor platelets by anticoagulation protocol: Yes   Plan:  Will bolus with 1200 units  and increase infusion to 700 units/hr. Will recheck heparin level in 8hr.  Paulina Fusi, PharmD, BCPS 06/05/2016 8:09 PM    06/06/2016 0419 heparin level therapeutic x 1. Continue current rate. Will recheck heparin level in 8 hours.  Rosalina Dingwall A. [place], [place].D., BCPS Clinical Pharmacist

## 2016-06-05 NOTE — Progress Notes (Signed)
Patient's HR increases to as high as 150's, but does not sustain. Currently telemetry shows 120's. Notified by telemetry clerk that patient has also decreased to as low as 30's and 40's. Dr. Farrel Conners notified and EKG ordered.  Wilnette Kales

## 2016-06-06 ENCOUNTER — Inpatient Hospital Stay (HOSPITAL_COMMUNITY)
Admission: RE | Admit: 2016-06-06 | Discharge: 2016-06-06 | Disposition: A | Discharge status: Home / Self Care | Payer: Medicare HMO | Source: Ambulatory Visit | Attending: Internal Medicine | Admitting: Internal Medicine

## 2016-06-06 DIAGNOSIS — I4891 Unspecified atrial fibrillation: Secondary | ICD-10-CM

## 2016-06-06 DIAGNOSIS — I495 Sick sinus syndrome: Secondary | ICD-10-CM

## 2016-06-06 DIAGNOSIS — I481 Persistent atrial fibrillation: Secondary | ICD-10-CM

## 2016-06-06 LAB — ECHOCARDIOGRAM COMPLETE
FS: 45 % — AB (ref 28–44)
Height: 66 in
IVS/LV PW RATIO, ED: 0.81
LA ID, A-P, ES: 36 mm
LA diam index: 2.68 cm/m2
LAVOLA4C: 76.1 mL
LEFT ATRIUM END SYS DIAM: 36 mm
MV VTI: 181 cm
PW: 13.8 mm — AB (ref 0.6–1.1)
Weight: 1408 oz

## 2016-06-06 LAB — BASIC METABOLIC PANEL
ANION GAP: 5 (ref 5–15)
BUN: 20 mg/dL (ref 6–20)
CHLORIDE: 104 mmol/L (ref 101–111)
CO2: 31 mmol/L (ref 22–32)
Calcium: 8.1 mg/dL — ABNORMAL LOW (ref 8.9–10.3)
Creatinine, Ser: 0.67 mg/dL (ref 0.44–1.00)
GFR calc non Af Amer: >60 mL/min (ref >60)
Glucose, Bld: 95 mg/dL (ref 65–99)
POTASSIUM: 4.1 mmol/L (ref 3.5–5.1)
SODIUM: 140 mmol/L (ref 135–145)

## 2016-06-06 LAB — HEPARIN LEVEL (UNFRACTIONATED)
Heparin Unfractionated: 0.37 IU/mL (ref 0.30–0.70)
Heparin Unfractionated: 0.3 IU/mL (ref 0.30–0.70)

## 2016-06-06 LAB — CBC
HEMATOCRIT: 43.6 % (ref 35.0–47.0)
HEMOGLOBIN: 14 g/dL (ref 12.0–16.0)
MCH: 27.8 pg (ref 26.0–34.0)
MCHC: 32.2 g/dL (ref 32.0–36.0)
MCV: 86.5 fL (ref 80.0–100.0)
PLATELETS: 269 10*3/uL (ref 150–440)
RBC: 5.05 MIL/uL (ref 3.80–5.20)
RDW: 32.5 % — ABNORMAL HIGH (ref 11.5–14.5)
WBC: 4.9 10*3/uL (ref 3.6–11.0)

## 2016-06-06 NOTE — Progress Notes (Addendum)
Patients HR continues to fluctuated between 30s- 150s. RN has seen patient in NSR, Afib and Aflutter this morning, pt is A&O, BP stable, pt reports she does not feel when her heart fluctuates like this. Dr. Caryl Comes paged and waiting on call back. Dr. Bridgett Larsson rounding and made aware, defer to cardiolgy when call back.

## 2016-06-06 NOTE — Progress Notes (Signed)
ANTICOAGULATION CONSULT NOTE -Follow Up Consult  Pharmacy Consult for Heparin Drip Indication: atrial fibrillation  Allergies  Allergen Reactions  . Penicillins Hives    Patient Measurements: Height: 5\' 6"  (167.6 cm) Weight: 88 lb (39.917 kg) IBW/kg (Calculated) : 59.3 Heparin Dosing Weight: 39.9 kg  Vital Signs: Temp: 97.6 F (36.4 C) (06/10 0400) Temp Source: Oral (06/10 0400) BP: 139/101 mmHg (06/10 0847) Pulse Rate: 138 (06/10 0847)  Labs:  Recent Labs  06/05/16 0705 06/05/16 1856 06/06/16 0419 06/06/16 1157  HGB 15.4  --  14.0  --   HCT 46.5  --  43.6  --   PLT 305  --  269  --   APTT  --  29  --   --   LABPROT  --  12.8  --   --   INR  --  0.94  --   --   HEPARINUNFRC  --  <0.10* 0.37 0.30  CREATININE 0.78  --  0.67  --     Estimated Creatinine Clearance: 31.8 mL/min (by C-G formula based on Cr of 0.67).   Medical History: Past Medical History  Diagnosis Date  . Essential hypertension   . Osteoporosis   . Lower back pain   . History of stomach ulcers     a. s/p surgery ~ 40 yrs ago.  . Polycythemia     a. phlebotomy.  Marland Kitchen COPD (chronic obstructive pulmonary disease) ([place])   . IDA (iron deficiency anemia)     a. 04/2016 CT Abd: no malignancy.  . Weight loss, unintentional   . Thyroid nodule     a. 04/2016 large left thyroid nodule on CT chest  - rec dedicated u/s.  . Pulmonary nodule, left     a. 04/2016 CT chest: 64mm LUL nodule - f/u CT in 12 months (h/o smoking).  Marland Kitchen PAD (peripheral artery disease) ([place])     a. 04/2016 CT Abd: occluded prox LSFA.  . Superior mesenteric artery stenosis ([place])     a. 04/2016 CT Abd: atheromatous plaque and mural thrombus in SMA.  . Tobacco abuse     Medications:  Infusions:  . sodium chloride 75 mL/hr at 06/06/16 1018  . heparin 700 Units/hr (06/05/16 2010)    Assessment: 82 yo female admitted with new onset a fib.  6/10 at 11:57 HL = 0.30  Goal of Therapy:  Heparin level 0.3-0.7 units/ml Monitor platelets  by anticoagulation protocol: Yes   Plan:  6/10 HL therapeutic x 2.  Continue current rate.  Will recheck HL with am labs.   Olivia Canter, [facility] Clinical Pharmacist 06/06/2016, 12:54 PM

## 2016-06-06 NOTE — Progress Notes (Addendum)
Patient has rested quietly in bed for majority of the day with exception of OOB to [facility] for toileting. HR remains uncontrolled, ranging from 100s-150s, though does not remain sustained above 130s for long. Several pauses throughout the day ranging from 2.05 sec to 5.45 sec, pt has been symptomatic for all of these changes. Attending and cardiologist aware, no interventions aside from scheduled PO metoprolol. Otherwise, assessment unchanged, VSS, heparin gtt and NS continue to infuse. Will continue to monitor.

## 2016-06-06 NOTE — Progress Notes (Signed)
Per CCMD, patient had another 5.41sec pause at 1015. The charge nurse was in the room at the time and per her, pt was again asymptomatic. Dr. Caryl Comes rounding at this time and made aware of all telemetry changes.

## 2016-06-06 NOTE — Progress Notes (Signed)
Patient Name: Lydia [name]      SUBJECTIVE: Admitted 6/9 for anticipated PAD intervention for questionable mesenteric ischemia in the context of an unexplained ongoing weight loss. lower extremity occlusion. She was found to be in atrial fibrillation with a rapid rate and was subsequently admitted.  She is unaware of palpitations. She denies prior transient neurological events. She does have symptoms of fatigue.  She continues with paroxysms of rapid atrial fibrillation and rates up to 180 and pauses of up to 5 seconds   As well as rates into the 20s.  Because of anticipated angiography early next week, it was elected to start her on heparin and not begi n oral anticoagulation  Echocardiogram ordered yesterday is not  Available  Her weight loss issue back after about 3 or 4 years. Fatigue is bigger problem 1-2 years.    old records from care everywhere were reviewed. I cannot find a prior electrocardiogram. Heart rates recorded from physical examinations  from 80-95; I only reviewed a few.   Past Medical History  Diagnosis Date  . Essential hypertension   . Osteoporosis   . Lower back pain   . History of stomach ulcers     a. s/p surgery ~ 40 yrs ago.  . Polycythemia     a. phlebotomy.  Marland Kitchen COPD (chronic obstructive pulmonary disease) ([place])   . IDA (iron deficiency anemia)     a. 04/2016 CT Abd: no malignancy.  . Weight loss, unintentional   . Thyroid nodule     a. 04/2016 large left thyroid nodule on CT chest  - rec dedicated u/s.  . Pulmonary nodule, left     a. 04/2016 CT chest: 35mm LUL nodule - f/u CT in 12 months (h/o smoking).  Marland Kitchen PAD (peripheral artery disease) ([place])     a. 04/2016 CT Abd: occluded prox LSFA.  . Superior mesenteric artery stenosis ([place])     a. 04/2016 CT Abd: atheromatous plaque and mural thrombus in SMA.  . Tobacco abuse     Scheduled Meds:  Scheduled Meds: . metoprolol  50 mg Oral BID  . nicotine  14 mg Transdermal Daily  . pantoprazole   40 mg Oral Daily  . pneumococcal 23 valent vaccine  0.5 mL Intramuscular Tomorrow-1000  . sodium chloride flush  3 mL Intravenous Q12H   Continuous Infusions: . sodium chloride 75 mL/hr at 06/06/16 1018  . heparin 700 Units/hr (06/05/16 2010)   acetaminophen **OR** acetaminophen, albuterol, ondansetron **OR** ondansetron (ZOFRAN) IV    PHYSICAL EXAM  Well developed and cachectic in no acute distress HENT normal Neck supple with JVP-flat Carotids brisk and full without bruits Clear Irregularly irregular rate and rhythm with controlled ventricular response, no murmurs or gallops Abd-soft with active BS without hepatomegaly No Clubbing cyanosis edema Skin-warm and dry A & Oriented  Grossly normal sensory and motor function   TELEMETRY: Reviewed telemetry pt in afib rates 20's--180:    Intake/Output Summary (Last 24 hours) at 06/06/16 1057 Last data filed at 06/06/16 1014  Gross per 24 hour  Intake    240 ml  Output   1850 ml  Net  -1610 ml    LABS: Basic Metabolic Panel:  Recent Labs Lab 06/05/16 0705 06/05/16 1856 06/06/16 0419  NA 138  --  140  K 4.1  --  4.1  CL 98*  --  104  CO2 32  --  31  GLUCOSE 105*  --  95  BUN 24*  --  20  CREATININE 0.78  --  0.67  CALCIUM 8.9  --  8.1*  MG  --  1.8  --    Cardiac Enzymes: No results for input(s): CKTOTAL, CKMB, CKMBINDEX, TROPONINI in the last 72 hours. CBC:  Recent Labs Lab 06/05/16 0705 06/06/16 0419  WBC 6.8 4.9  HGB 15.4 14.0  HCT 46.5 43.6  MCV 83.6 86.5  PLT 305 269   PROTIME:  Recent Labs  06/05/16 1856  LABPROT 12.8  INR 0.94   Liver Function Tests: No results for input(s): AST, ALT, ALKPHOS, BILITOT, PROT, ALBUMIN in the last 72 hours. No results for input(s): LIPASE, AMYLASE in the last 72 hours. BNP: BNP (last 3 results) No results for input(s): BNP in the last 8760 hours.  ProBNP (last 3 results) No results for input(s): PROBNP in the last 8760 hours.  D-Dimer: No results  for input(s): DDIMER in the last 72 hours. Hemoglobin A1C: No results for input(s): HGBA1C in the last 72 hours. Fasting Lipid Panel: No results for input(s): CHOL, HDL, LDLCALC, TRIG, CHOLHDL, LDLDIRECT in the last 72 hours. Thyroid Function Tests:  Recent Labs  06/05/16 1856  TSH 1.673      ASSESSMENT AND PLAN:  Active Problems:   Atrial fibrillation (HCC)   Protein-calorie malnutrition, severe   Tachycardia-bradycardia syndrome ([place])  The patient has atrial fibrillation-persistent with rates ranging from the 20s--180s. [name] also had pauses of almost 6 seconds, and I think it is this latter issue which is the most compelling of her immediate needs. Puts her at a significant risk for fall which could be devastating given her nutritional status and risk for fracture. Hence, I think it should take precedence over her angiography which at least as best as I can tell from the patient is a more chronic issue having been associated over 3 years with early satiety and weight loss.   We will have to review this with vascular surgery.  I'm also wondering, based on her history, whether her leg problem is vascular and she says while it may be aggravated by walking and does not relieve with rest.  We will await her echocardiogram.  In the event that we're to proceed with pacing, want to discontinue her heparin and start her on a NOAC.  Signed, Virl Axe MD  06/06/2016

## 2016-06-06 NOTE — Progress Notes (Addendum)
Patients HR has been sustaining in 130s-160s, occasionally bradying to 50s with 2sec pause. Dr. Bridgett Larsson paged and aware, did not give orders, only to defer to cardiology. Dr. Caryl Comes paged and aware, no orders at this time due to risk of bradycardia. MD asked RN to update family with plan: [facility] and discharge Tuesday to f/u with Dr. Delana Meyer for vascular procedure. Will update patient and husband. Pt still asymptomatic. Will continue to monitor.

## 2016-06-06 NOTE — Progress Notes (Signed)
Per CCMD, patient had 5.45 sec pause. Upon assessment, patient is A&O, BP stable, pt reports not feeling anything going on with her heart. Dr. Bridgett Larsson paged and aware, defer to cardiology who should be here around 10am. Will make Dr. Caryl Comes aware when he arrives.

## 2016-06-06 NOTE — Progress Notes (Signed)
Per Dr. Caryl Comes, he will be by around 10am, give PO metoprolol as scheduled, no further orders. MD wants to see strips when he rounds. Dr. Bridgett Larsson declined RN suggestion of EKG. Will continue to monitor, again, patient continues to be asymptomatic.

## 2016-06-06 NOTE — Progress Notes (Signed)
[place] at [place] NAME: Lydia [name]    MR#:  [id number]  DATE OF BIRTH:  1927/02/28  SUBJECTIVE:  CHIEF COMPLAINT:  No chief complaint on file.  No complaint. atrial fibrillation-persistent with rates ranging from the 20s--180s. [name] also had pauses of almost 6 seconds per RN. REVIEW OF SYSTEMS:  CONSTITUTIONAL: No fever, fatigue or weakness.  EYES: No blurred or double vision.  EARS, NOSE, AND THROAT: No tinnitus or ear pain.  RESPIRATORY: No cough, shortness of breath, wheezing or hemoptysis.  CARDIOVASCULAR: No chest pain, orthopnea, edema.  GASTROINTESTINAL: No nausea, vomiting, diarrhea or abdominal pain.  GENITOURINARY: No dysuria, hematuria.  ENDOCRINE: No polyuria, nocturia,  HEMATOLOGY: No anemia, easy bruising or bleeding SKIN: No rash or lesion. MUSCULOSKELETAL: No joint pain or arthritis.   NEUROLOGIC: No tingling, numbness, weakness.  PSYCHIATRY: No anxiety or depression.   DRUG ALLERGIES:   Allergies  Allergen Reactions  . Penicillins Hives    VITALS:  Blood pressure 139/101, pulse 138, temperature 97.6 F (36.4 C), temperature source Oral, resp. rate 18, height 5\' 6"  (1.676 m), weight 88 lb (39.917 kg), SpO2 93 %.  PHYSICAL EXAMINATION:  GENERAL:  82 y.o.-year-old patient lying in the bed with no acute distress.  EYES: Pupils equal, round, reactive to light and accommodation. No scleral icterus. Extraocular muscles intact.  HEENT: Head atraumatic, normocephalic. Oropharynx and nasopharynx clear.  NECK:  Supple, no jugular venous distention. No thyroid enlargement, no tenderness.  LUNGS: Normal breath sounds bilaterally, no wheezing, rales,rhonchi or crepitation. No use of accessory muscles of respiration.  CARDIOVASCULAR: Irregular rate and rhythm, tachycardia. No murmurs, rubs, or gallops.  ABDOMEN: Soft, nontender, nondistended. Bowel sounds present. No organomegaly or mass.  EXTREMITIES: No pedal  edema, cyanosis, or clubbing. No pedal pulses on both sides. NEUROLOGIC: Cranial nerves II through XII are intact. Muscle strength 5/5 in all extremities. Sensation intact. Gait not checked.  PSYCHIATRIC: The patient is alert and oriented x 3.  SKIN: No obvious rash, lesion, or ulcer.    LABORATORY PANEL:   CBC  Recent Labs Lab 06/06/16 0419  WBC 4.9  HGB 14.0  HCT 43.6  PLT 269   ------------------------------------------------------------------------------------------------------------------  Chemistries   Recent Labs Lab 06/05/16 1856 06/06/16 0419  NA  --  140  K  --  4.1  CL  --  104  CO2  --  31  GLUCOSE  --  95  BUN  --  20  CREATININE  --  0.67  CALCIUM  --  8.1*  MG 1.8  --    ------------------------------------------------------------------------------------------------------------------  Cardiac Enzymes No results for input(s): TROPONINI in the last 168 hours. ------------------------------------------------------------------------------------------------------------------  RADIOLOGY:  No results found.  EKG:   Orders placed or performed during the hospital encounter of 06/05/16  . EKG 12-Lead  . EKG 12-Lead  . EKG 12-Lead  . EKG 12-Lead  . EKG 12-Lead  . EKG 12-Lead  . EKG 12-lead  . EKG 12-Lead  . EKG 12-Lead  . EKG 12-Lead  . EKG 12-Lead    ASSESSMENT AND PLAN:   New on set A fib with RVR, Heart rate is still not controlled.  cardizem dripWas discontinued due to bradycardia. On by mouth Lopressor. On heparin drip, follow-up echo. Per cardiologist, Dr. Caryl Comes, the patient and need a pacemaker [facility] on Monday. He suggested that he will change heparin drip to eliquis.  Mesenteric ischemia and PVD. On heparin drip and change to eliquis.  Follow-up Dr. Delana Meyer.  Dehydration. Improved.  HTN, continue norvasc and lopressor.  COPD. Stable, NEB prn.  Polycythemia Follow up Oncology as outpatient.  Malnutrition.  Dietitian consult.  Tobacco abuse. Smoking cessation was counseled for 3-4 minutes, nicotine patch.  I discussed with Dr. Caryl Comes.  All the records are reviewed and case discussed with Care Management/Social Workerr. Management plans discussed with the patient, family and they are in agreement.  CODE STATUS: DO NOT RESUSCITATE  TOTAL TIME TAKING CARE OF THIS PATIENT: 43 minutes.  Greater than 50% time was spent on coordination of care and face-to-face counseling.  POSSIBLE D/C IN 3 DAYS, DEPENDING ON CLINICAL CONDITION.   Demetrios Loll M.D on 06/06/2016 at 11:52 AM  Between 7am to 6pm - Pager - [phone]  After 6pm go to [email or web address] - password [email or web address] [facility]  [facility]  Office  [phone]  CC: Primary care physician; Marinda Elk, MD

## 2016-06-07 DIAGNOSIS — I480 Paroxysmal atrial fibrillation: Secondary | ICD-10-CM | POA: Diagnosis not present

## 2016-06-07 LAB — HEPARIN LEVEL (UNFRACTIONATED)
HEPARIN UNFRACTIONATED: 0.22 [IU]/mL — AB (ref 0.30–0.70)
Heparin Unfractionated: 0.39 IU/mL (ref 0.30–0.70)

## 2016-06-07 MED ORDER — DILTIAZEM HCL 25 MG/5ML IV SOLN
10.0000 mg | INTRAVENOUS | Status: DC | PRN
  Administered 2016-06-07: 10 mg via INTRAVENOUS

## 2016-06-07 MED ORDER — FUROSEMIDE 10 MG/ML IJ SOLN
40.0000 mg | Freq: Once | INTRAMUSCULAR | Status: AC
  Administered 2016-06-07: 40 mg via INTRAVENOUS

## 2016-06-07 MED ORDER — HEPARIN BOLUS VIA INFUSION
600.0000 [IU] | Freq: Once | INTRAVENOUS | Status: AC
  Administered 2016-06-07: 600 [IU] via INTRAVENOUS
  Filled 2016-06-07: qty 600

## 2016-06-07 MED ORDER — DOCUSATE SODIUM 100 MG PO CAPS
100.0000 mg | ORAL_CAPSULE | Freq: Two times a day (BID) | ORAL | Status: DC
  Administered 2016-06-07 – 2016-06-08 (×3): 100 mg via ORAL
  Filled 2016-06-07 (×4): qty 1

## 2016-06-07 MED ORDER — BISACODYL 5 MG PO TBEC
5.0000 mg | DELAYED_RELEASE_TABLET | Freq: Every day | ORAL | Status: DC | PRN
Start: 2016-06-07 — End: 2016-06-08
  Administered 2016-06-07: 5 mg via ORAL
  Filled 2016-06-07: qty 1

## 2016-06-07 MED ORDER — FUROSEMIDE 10 MG/ML IJ SOLN
INTRAMUSCULAR | Status: AC
  Administered 2016-06-07: 40 mg via INTRAVENOUS
  Filled 2016-06-07: qty 4

## 2016-06-07 MED ORDER — DILTIAZEM HCL 25 MG/5ML IV SOLN
INTRAVENOUS | Status: AC
  Administered 2016-06-07: 10 mg via INTRAVENOUS
  Filled 2016-06-07: qty 5

## 2016-06-07 NOTE — Progress Notes (Signed)
[facility] [place] NAME: Lydia [name]    MR#:  [id number]  DATE OF BIRTH:  November 04, 1927  SUBJECTIVE:  CHIEF COMPLAINT:  No chief complaint on file.  weakness. REVIEW OF SYSTEMS:  CONSTITUTIONAL: No fever, fatigue or weakness.  EYES: No blurred or double vision.  EARS, NOSE, AND THROAT: No tinnitus or ear pain.  RESPIRATORY: No cough, shortness of breath, wheezing or hemoptysis.  CARDIOVASCULAR: No chest pain, orthopnea, edema.  GASTROINTESTINAL: No nausea, vomiting, diarrhea or abdominal pain.  GENITOURINARY: No dysuria, hematuria.  ENDOCRINE: No polyuria, nocturia,  HEMATOLOGY: No anemia, easy bruising or bleeding SKIN: No rash or lesion. MUSCULOSKELETAL: No joint pain or arthritis.   NEUROLOGIC: No tingling, numbness, weakness.  PSYCHIATRY: No anxiety or depression.   DRUG ALLERGIES:   Allergies  Allergen Reactions  . Penicillins Hives    VITALS:  Blood pressure 135/77, pulse 72, temperature 97.8 F (36.6 C), temperature source Oral, resp. rate 18, height 5\' 6"  (1.676 m), weight 88 lb (39.917 kg), SpO2 89 %.  PHYSICAL EXAMINATION:  GENERAL:  82 y.o.-year-old patient lying in the bed with no acute distress.  EYES: Pupils equal, round, reactive to light and accommodation. No scleral icterus. Extraocular muscles intact.  HEENT: Head atraumatic, normocephalic. Oropharynx and nasopharynx clear.  NECK:  Supple, no jugular venous distention. No thyroid enlargement, no tenderness.  LUNGS: Normal breath sounds bilaterally, no wheezing, rales,rhonchi or crepitation. No use of accessory muscles of respiration.  CARDIOVASCULAR: Irregular rate and rhythm, tachycardia. No murmurs, rubs, or gallops.  ABDOMEN: Soft, nontender, nondistended. Bowel sounds present. No organomegaly or mass.  EXTREMITIES: No pedal edema, cyanosis, or clubbing. No pedal pulses on both sides. NEUROLOGIC: Cranial nerves II through XII are intact. Muscle  strength 5/5 in all extremities. Sensation intact. Gait not checked.  PSYCHIATRIC: The patient is alert and oriented x 3.  SKIN: No obvious rash, lesion, or ulcer.    LABORATORY PANEL:   CBC  Recent Labs Lab 06/06/16 0419  WBC 4.9  HGB 14.0  HCT 43.6  PLT 269   ------------------------------------------------------------------------------------------------------------------  Chemistries   Recent Labs Lab 06/05/16 1856 06/06/16 0419  NA  --  140  K  --  4.1  CL  --  104  CO2  --  31  GLUCOSE  --  95  BUN  --  20  CREATININE  --  0.67  CALCIUM  --  8.1*  MG 1.8  --    ------------------------------------------------------------------------------------------------------------------  Cardiac Enzymes No results for input(s): TROPONINI in the last 168 hours. ------------------------------------------------------------------------------------------------------------------  RADIOLOGY:  No results found.  EKG:   Orders placed or performed during the hospital encounter of 06/05/16  . EKG 12-Lead  . EKG 12-Lead  . EKG 12-Lead  . EKG 12-Lead  . EKG 12-Lead  . EKG 12-Lead  . EKG 12-lead  . EKG 12-Lead  . EKG 12-Lead  . EKG 12-Lead  . EKG 12-Lead    ASSESSMENT AND PLAN:   New on set A fib with RVR, Heart rate is still not controlled.  cardizem drip was discontinued due to bradycardia. On by mouth Lopressor. On heparin drip, follow-up echo: LV EF: 50% - 0000000 systolic function. Per cardiologist, Dr. Caryl Comes, leave her on heparin and if vascular loss to proceed on Tuesday and she has no further atrial fibrillation in the interim as fine. If she were to revert to atrial fibrillation between now and tomorrow, I would transfer her to [facility] as anticipated;  she is on the board at [facility] for pacing tomorrow afternoon.  Mesenteric ischemia and PVD. On heparin drip,  Follow-up Dr. Delana Meyer for procedure..  Dehydration. Improved.  HTN, continue norvasc and  lopressor.  COPD. Stable, NEB prn.  Polycythemia Follow up Oncology as outpatient.  Malnutrition. Dietitian consult.  Tobacco abuse. Smoking cessation was counseled, on nicotine patch.  I discussed with Dr. Caryl Comes.  All the records are reviewed and case discussed with Care Management/Social Workerr. Management plans discussed with the patient, her husband and they are in agreement.  CODE STATUS: DO NOT RESUSCITATE  TOTAL TIME TAKING CARE OF THIS PATIENT: 37 minutes.  Greater than 50% time was spent on coordination of care and face-to-face counseling.  POSSIBLE D/C IN 3 DAYS, DEPENDING ON CLINICAL CONDITION.   Demetrios Loll M.D on 06/07/2016 at 12:42 PM  Between 7am to 6pm - Pager - [phone]  After 6pm go to [email or web address] - password [email or web address] [facility]  [facility]  Office  [phone]  CC: Primary care physician; Marinda Elk, MD

## 2016-06-07 NOTE — Progress Notes (Signed)
ANTICOAGULATION CONSULT NOTE -Follow Up Consult  Pharmacy Consult for Heparin Drip Indication: atrial fibrillation  Allergies  Allergen Reactions  . Penicillins Hives    Patient Measurements: Height: 5\' 6"  (167.6 cm) Weight: 88 lb (39.917 kg) IBW/kg (Calculated) : 59.3 Heparin Dosing Weight: 39.9 kg  Vital Signs: Temp: 97.8 F (36.6 C) (06/11 1203) BP: 135/77 mmHg (06/11 1203) Pulse Rate: 72 (06/11 1203)  Labs:  Recent Labs  06/05/16 0705  06/05/16 1856 06/06/16 0419 06/06/16 1157 06/07/16 0553 06/07/16 1502  HGB 15.4  --   --  14.0  --   --   --   HCT 46.5  --   --  43.6  --   --   --   PLT 305  --   --  269  --   --   --   APTT  --   --  29  --   --   --   --   LABPROT  --   --  12.8  --   --   --   --   INR  --   --  0.94  --   --   --   --   HEPARINUNFRC  --   < > <0.10* 0.37 0.30 0.22* 0.39  CREATININE 0.78  --   --  0.67  --   --   --   < > = values in this interval not displayed.  Estimated Creatinine Clearance: 31.8 mL/min (by C-G formula based on Cr of 0.67).   Medical History: Past Medical History  Diagnosis Date  . Essential hypertension   . Osteoporosis   . Lower back pain   . History of stomach ulcers     a. s/p surgery ~ 40 yrs ago.  . Polycythemia     a. phlebotomy.  Marland Kitchen COPD (chronic obstructive pulmonary disease) ([place])   . IDA (iron deficiency anemia)     a. 04/2016 CT Abd: no malignancy.  . Weight loss, unintentional   . Thyroid nodule     a. 04/2016 large left thyroid nodule on CT chest  - rec dedicated u/s.  . Pulmonary nodule, left     a. 04/2016 CT chest: 46mm LUL nodule - f/u CT in 12 months (h/o smoking).  Marland Kitchen PAD (peripheral artery disease) ([place])     a. 04/2016 CT Abd: occluded prox LSFA.  . Superior mesenteric artery stenosis ([place])     a. 04/2016 CT Abd: atheromatous plaque and mural thrombus in SMA.  . Tobacco abuse     Medications:  Infusions:  . heparin 800 Units/hr (06/07/16 [id number])    Assessment: 82 yo female admitted with  new onset a fib.  6/10 at 11:57 HL = 0.30  Goal of Therapy:  Heparin level 0.3-0.7 units/ml Monitor platelets by anticoagulation protocol: Yes   Plan:  Heparin level subtherapeutic. 600 unit IV x 1 bolus and increase rate to 800 units/hr. Will recheck heparin level in 8 hours.  6/11:  HL @ 15:00 = 0.39 Will continue this pt on current rate and recheck HL in 8 hrs on 6/11 @ 23:30.    Orene Desanctis, [facility] Clinical Pharmacist 06/07/2016, 3:39 PM

## 2016-06-07 NOTE — Progress Notes (Signed)
Patient's HR still sustaining in the 130s-160s with occasional drops to 110s. Notified MD Pyreddy and order for 10mg  IV cardizem every 4 hours PRN for HR greater than 110 placed. Nursing staff will continue to monitor. Earleen Reaper, RN

## 2016-06-07 NOTE — Progress Notes (Signed)
Patient back in afib/flutter HR 120s. A&O asymptomatic at this time. Will continue to monitor.

## 2016-06-07 NOTE — Plan of Care (Signed)
Problem: Safety: Goal: Ability to remain free from injury will improve Outcome: Progressing Pt remains mod+ fall risk, bed alarm in use, non-skid socks in place, pt educated to call prior to getting OOB to use BSC.  Problem: Pain Managment: Goal: General experience of comfort will improve Outcome: Progressing Denies any pain this shift.  Problem: Tissue Perfusion: Goal: Risk factors for ineffective tissue perfusion will decrease Outcome: Progressing Pharmacy dosed heparin gtt infusing   Problem: Activity: Goal: Risk for activity intolerance will decrease Outcome: Not Progressing Pt HR unable to tolerate minimal ambulation as it increases to 140s-160s when OOB.  Problem: Bowel/Gastric: Goal: Will not experience complications related to bowel motility Outcome: Progressing Patient reported constipation and requested stool softners and laxatives. MD ordered and RN administered per pt request.  Problem: Cardiac: Goal: Ability to achieve and maintain adequate cardiopulmonary perfusion will improve Outcome: Progressing Patient converted to NSR this afternoon sometime, rate controlled in 70s-80s. Frequent PACs per CCMD.   Problem: Health Behavior/Discharge Planning: Goal: Ability to safely manage health-related needs after discharge will improve Outcome: Progressing Pt aware current plan is to transfer to [facility] tomorrow for PPM placement.

## 2016-06-07 NOTE — Progress Notes (Signed)
ANTICOAGULATION CONSULT NOTE -Follow Up Consult  Pharmacy Consult for Heparin Drip Indication: atrial fibrillation  Allergies  Allergen Reactions  . Penicillins Hives    Patient Measurements: Height: 5\' 6"  (167.6 cm) Weight: 88 lb (39.917 kg) IBW/kg (Calculated) : 59.3 Heparin Dosing Weight: 39.9 kg  Vital Signs: Temp: 97.5 F (36.4 C) (06/11 0545) Temp Source: Oral (06/10 1947) BP: 138/90 mmHg (06/11 0548) Pulse Rate: 146 (06/11 0552)  Labs:  Recent Labs  06/05/16 0705  06/05/16 1856 06/06/16 0419 06/06/16 1157 06/07/16 0553  HGB 15.4  --   --  14.0  --   --   HCT 46.5  --   --  43.6  --   --   PLT 305  --   --  269  --   --   APTT  --   --  29  --   --   --   LABPROT  --   --  12.8  --   --   --   INR  --   --  0.94  --   --   --   HEPARINUNFRC  --   < > <0.10* 0.37 0.30 0.22*  CREATININE 0.78  --   --  0.67  --   --   < > = values in this interval not displayed.  Estimated Creatinine Clearance: 31.8 mL/min (by C-G formula based on Cr of 0.67).   Medical History: Past Medical History  Diagnosis Date  . Essential hypertension   . Osteoporosis   . Lower back pain   . History of stomach ulcers     a. s/p surgery ~ 40 yrs ago.  . Polycythemia     a. phlebotomy.  Marland Kitchen COPD (chronic obstructive pulmonary disease) ([place])   . IDA (iron deficiency anemia)     a. 04/2016 CT Abd: no malignancy.  . Weight loss, unintentional   . Thyroid nodule     a. 04/2016 large left thyroid nodule on CT chest  - rec dedicated u/s.  . Pulmonary nodule, left     a. 04/2016 CT chest: 56mm LUL nodule - f/u CT in 12 months (h/o smoking).  Marland Kitchen PAD (peripheral artery disease) ([place])     a. 04/2016 CT Abd: occluded prox LSFA.  . Superior mesenteric artery stenosis ([place])     a. 04/2016 CT Abd: atheromatous plaque and mural thrombus in SMA.  . Tobacco abuse     Medications:  Infusions:  . heparin 700 Units/hr (06/06/16 2055)    Assessment: 82 yo female admitted with new onset a fib.   6/10 at 11:57 HL = 0.30  Goal of Therapy:  Heparin level 0.3-0.7 units/ml Monitor platelets by anticoagulation protocol: Yes   Plan:  Heparin level subtherapeutic. 600 unit IV x 1 bolus and increase rate to 800 units/hr. Will recheck heparin level in 8 hours.   Laural Benes, [facility] Clinical Pharmacist 06/07/2016, 6:27 AM

## 2016-06-07 NOTE — Progress Notes (Addendum)
Patient's HR sustaining in the 130s-160s, complaining of SOB with SpO2 in the 70s, and receiving IV fluids at 31mL/hr. Notified MD Pyreddy and orders given to discontinue IV fluids and give 40mg  IV Lasix once. Patient currently on 4L [place] with SpO2 in the low 90s and no further distress noted. Nursing staff will continue to monitor. Earleen Reaper, RN

## 2016-06-07 NOTE — Progress Notes (Signed)
Per patient, no BM in past few days, asking for stool softner. Dr. Bridgett Larsson rounding and made aware, orders placed.  Also made MD aware HR is still largely uncontrolled and pt had to receive IV lasix last night and be put on O2 via [place]; acknowledged, no further orders at this time. Patient A&O, resting quietly in room, 98% on [facility], continues to report no symptoms related to heart racing.

## 2016-06-07 NOTE — Progress Notes (Signed)
Patient on tele appears to be in NSR rate 60s-70s. Dr. Caryl Comes rounding made aware and confirmed NSR.

## 2016-06-07 NOTE — Progress Notes (Signed)
Patient Name: Lydia [name]      SUBJECTIVE: Admitted 6/9 for anticipated PAD intervention for questionable mesenteric ischemia in the context of an unexplained ongoing weight loss. lower extremity occlusion. She was found to be in atrial fibrillation with a rapid rate and was subsequently admitted.  She is unaware of palpitations. She denies prior transient neurological events. She does have symptoms of fatigue.  She continues with paroxysms of rapid atrial fibrillation and rates up to 180 and pauses of up to 5 seconds   As well as rates into the 20s.  Because of anticipated angiography early next week, it was elected to start her on heparin and not begi n oral anticoagulation  Echocardiogram EF 55%   Her weight loss issue back after about 3 or 4 years. Fatigue is bigger problem 1-2 years.    old records from care everywhere were reviewed. I cannot find a prior electrocardiogram. Heart rates recorded from physical examinations  from 80-95; I only reviewed a few.  She denies symptoms this am of chest pain or shortness of breath    Past Medical History  Diagnosis Date  . Essential hypertension   . Osteoporosis   . Lower back pain   . History of stomach ulcers     a. s/p surgery ~ 40 yrs ago.  . Polycythemia     a. phlebotomy.  Marland Kitchen COPD (chronic obstructive pulmonary disease) ([place])   . IDA (iron deficiency anemia)     a. 04/2016 CT Abd: no malignancy.  . Weight loss, unintentional   . Thyroid nodule     a. 04/2016 large left thyroid nodule on CT chest  - rec dedicated u/s.  . Pulmonary nodule, left     a. 04/2016 CT chest: 45mm LUL nodule - f/u CT in 12 months (h/o smoking).  Marland Kitchen PAD (peripheral artery disease) ([place])     a. 04/2016 CT Abd: occluded prox LSFA.  . Superior mesenteric artery stenosis ([place])     a. 04/2016 CT Abd: atheromatous plaque and mural thrombus in SMA.  . Tobacco abuse     Scheduled Meds:  Scheduled Meds: . docusate sodium  100 mg Oral BID  .  metoprolol  50 mg Oral BID  . nicotine  14 mg Transdermal Daily  . pantoprazole  40 mg Oral Daily  . sodium chloride flush  3 mL Intravenous Q12H   Continuous Infusions: . heparin 800 Units/hr (06/07/16 [id number])   acetaminophen **OR** acetaminophen, albuterol, bisacodyl, diltiazem, ondansetron **OR** ondansetron (ZOFRAN) IV    PHYSICAL EXAM  Well developed and cachectic in no acute distress HENT normal Neck supple with JVP-flat Carotids brisk and full without bruits Clear regualr  rate and rhythm with controlled ventricular response, no murmurs or gallops Abd-soft with active BS without hepatomegaly No Clubbing cyanosis edema Skin-warm and dry A & Oriented  Grossly normal sensory and motor function   TELEMETRY: Reviewed telemetry pt in sinus at 70s with PACs    Intake/Output Summary (Last 24 hours) at 06/07/16 1152 Last data filed at 06/07/16 1035  Gross per 24 hour  Intake    240 ml  Output   2325 ml  Net  -2085 ml    LABS: Basic Metabolic Panel:  Recent Labs Lab 06/05/16 0705 06/05/16 1856 06/06/16 0419  NA 138  --  140  K 4.1  --  4.1  CL 98*  --  104  CO2 32  --  31  GLUCOSE 105*  --  95  BUN 24*  --  20  CREATININE 0.78  --  0.67  CALCIUM 8.9  --  8.1*  MG  --  1.8  --    Cardiac Enzymes: No results for input(s): CKTOTAL, CKMB, CKMBINDEX, TROPONINI in the last 72 hours. CBC:  Recent Labs Lab 06/05/16 0705 06/06/16 0419  WBC 6.8 4.9  HGB 15.4 14.0  HCT 46.5 43.6  MCV 83.6 86.5  PLT 305 269   PROTIME:  Recent Labs  06/05/16 1856  LABPROT 12.8  INR 0.94   Liver Function Tests: No results for input(s): AST, ALT, ALKPHOS, BILITOT, PROT, ALBUMIN in the last 72 hours. No results for input(s): LIPASE, AMYLASE in the last 72 hours. BNP: BNP (last 3 results) No results for input(s): BNP in the last 8760 hours.  ProBNP (last 3 results) No results for input(s): PROBNP in the last 8760 hours.  D-Dimer: No results for input(s): DDIMER in the  last 72 hours. Hemoglobin A1C: No results for input(s): HGBA1C in the last 72 hours. Fasting Lipid Panel: No results for input(s): CHOL, HDL, LDLCALC, TRIG, CHOLHDL, LDLDIRECT in the last 72 hours. Thyroid Function Tests:  Recent Labs  06/05/16 1856  TSH 1.673      ASSESSMENT AND PLAN:  Active Problems:   Atrial fibrillation (HCC)   Protein-calorie malnutrition, severe   Tachycardia-bradycardia syndrome ([place])  The patient has atrial fibrillation-persistent with rates ranging from the 20s--180s. [name] also had pauses of almost 6 seconds, and I think it is this latter issue which is the most compelling of her immediate needs. Puts her at a significant risk for fall which could be devastating given her nutritional status and risk for fracture. Hence, I think it should take precedence over her angiography which at least as best as I can tell from the patient is a more chronic issue having been associated over 3 years with early satiety and weight loss.   Somewhat surprisingly, she reverted to sinus rhythm overnight. I am not sanguine that she will sustain this; however, we will do our best to help her using amiodarone. Her sinus rates are not so slow that we will necessarily run into sinus bradycardia. In the event that she has further atrial fibrillation I would have a low threshold for proceeding with pacing because of the tachybradycardia manifestations. For now, however, we will leave her on heparin and if vascular loss to proceed on Tuesday and she has no further atrial fibrillation in the interim as fine.  If she were to revert to atrial fibrillation between now and tomorrow, I would transfer her to [facility] as anticipated; she is on the board at [facility]  for pacing tomorrow afternoon   Signed, Virl Axe MD  06/07/2016

## 2016-06-08 ENCOUNTER — Encounter (HOSPITAL_COMMUNITY)
Admission: EM | Disposition: A | Discharge status: Skilled Nursing Facility | Payer: Self-pay | Source: Other Acute Inpatient Hospital | Attending: Internal Medicine

## 2016-06-08 ENCOUNTER — Inpatient Hospital Stay (HOSPITAL_COMMUNITY): Payer: Medicare HMO

## 2016-06-08 ENCOUNTER — Inpatient Hospital Stay (HOSPITAL_COMMUNITY)
Admission: EM | Admit: 2016-06-08 | Discharge: 2016-06-16 | DRG: 040 | Disposition: A | Discharge status: Skilled Nursing Facility | Payer: Medicare HMO | Source: Other Acute Inpatient Hospital | Attending: Internal Medicine | Admitting: Internal Medicine

## 2016-06-08 ENCOUNTER — Encounter: Payer: Self-pay | Admitting: Student

## 2016-06-08 ENCOUNTER — Emergency Department (HOSPITAL_COMMUNITY): Payer: Medicare HMO

## 2016-06-08 DIAGNOSIS — Y92234 Operating room of hospital as the place of occurrence of the external cause: Secondary | ICD-10-CM | POA: Diagnosis present

## 2016-06-08 DIAGNOSIS — I4892 Unspecified atrial flutter: Secondary | ICD-10-CM | POA: Diagnosis not present

## 2016-06-08 DIAGNOSIS — I455 Other specified heart block: Secondary | ICD-10-CM

## 2016-06-08 DIAGNOSIS — Y831 Surgical operation with implant of artificial internal device as the cause of abnormal reaction of the patient, or of later complication, without mention of misadventure at the time of the procedure: Secondary | ICD-10-CM | POA: Diagnosis not present

## 2016-06-08 DIAGNOSIS — R0602 Shortness of breath: Secondary | ICD-10-CM

## 2016-06-08 DIAGNOSIS — I480 Paroxysmal atrial fibrillation: Secondary | ICD-10-CM | POA: Insufficient documentation

## 2016-06-08 DIAGNOSIS — I495 Sick sinus syndrome: Secondary | ICD-10-CM | POA: Diagnosis present

## 2016-06-08 DIAGNOSIS — R64 Cachexia: Secondary | ICD-10-CM | POA: Diagnosis not present

## 2016-06-08 DIAGNOSIS — I739 Peripheral vascular disease, unspecified: Secondary | ICD-10-CM | POA: Diagnosis not present

## 2016-06-08 DIAGNOSIS — J441 Chronic obstructive pulmonary disease with (acute) exacerbation: Secondary | ICD-10-CM | POA: Insufficient documentation

## 2016-06-08 DIAGNOSIS — Q211 Atrial septal defect: Secondary | ICD-10-CM | POA: Diagnosis not present

## 2016-06-08 DIAGNOSIS — E43 Unspecified severe protein-calorie malnutrition: Secondary | ICD-10-CM | POA: Diagnosis not present

## 2016-06-08 DIAGNOSIS — Z880 Allergy status to penicillin: Secondary | ICD-10-CM

## 2016-06-08 DIAGNOSIS — Z950 Presence of cardiac pacemaker: Secondary | ICD-10-CM

## 2016-06-08 DIAGNOSIS — F17210 Nicotine dependence, cigarettes, uncomplicated: Secondary | ICD-10-CM | POA: Diagnosis not present

## 2016-06-08 DIAGNOSIS — D751 Secondary polycythemia: Secondary | ICD-10-CM | POA: Diagnosis present

## 2016-06-08 DIAGNOSIS — D72829 Elevated white blood cell count, unspecified: Secondary | ICD-10-CM | POA: Diagnosis not present

## 2016-06-08 DIAGNOSIS — J9602 Acute respiratory failure with hypercapnia: Secondary | ICD-10-CM | POA: Diagnosis present

## 2016-06-08 DIAGNOSIS — I6340 Cerebral infarction due to embolism of unspecified cerebral artery: Secondary | ICD-10-CM | POA: Insufficient documentation

## 2016-06-08 DIAGNOSIS — R634 Abnormal weight loss: Secondary | ICD-10-CM

## 2016-06-08 DIAGNOSIS — G9340 Encephalopathy, unspecified: Secondary | ICD-10-CM | POA: Diagnosis not present

## 2016-06-08 DIAGNOSIS — Z8673 Personal history of transient ischemic attack (TIA), and cerebral infarction without residual deficits: Secondary | ICD-10-CM | POA: Diagnosis not present

## 2016-06-08 DIAGNOSIS — D329 Benign neoplasm of meninges, unspecified: Secondary | ICD-10-CM | POA: Diagnosis not present

## 2016-06-08 DIAGNOSIS — J9600 Acute respiratory failure, unspecified whether with hypoxia or hypercapnia: Secondary | ICD-10-CM | POA: Diagnosis not present

## 2016-06-08 DIAGNOSIS — L8990 Pressure ulcer of unspecified site, unspecified stage: Secondary | ICD-10-CM | POA: Insufficient documentation

## 2016-06-08 DIAGNOSIS — Z79899 Other long term (current) drug therapy: Secondary | ICD-10-CM | POA: Diagnosis not present

## 2016-06-08 DIAGNOSIS — I10 Essential (primary) hypertension: Secondary | ICD-10-CM | POA: Diagnosis not present

## 2016-06-08 DIAGNOSIS — I483 Typical atrial flutter: Secondary | ICD-10-CM

## 2016-06-08 DIAGNOSIS — J189 Pneumonia, unspecified organism: Secondary | ICD-10-CM | POA: Insufficient documentation

## 2016-06-08 DIAGNOSIS — Z01818 Encounter for other preprocedural examination: Secondary | ICD-10-CM

## 2016-06-08 DIAGNOSIS — I97821 Postprocedural cerebrovascular infarction during other surgery: Secondary | ICD-10-CM | POA: Diagnosis present

## 2016-06-08 DIAGNOSIS — J690 Pneumonitis due to inhalation of food and vomit: Secondary | ICD-10-CM | POA: Diagnosis present

## 2016-06-08 DIAGNOSIS — G8194 Hemiplegia, unspecified affecting left nondominant side: Secondary | ICD-10-CM | POA: Diagnosis present

## 2016-06-08 DIAGNOSIS — D509 Iron deficiency anemia, unspecified: Secondary | ICD-10-CM | POA: Diagnosis present

## 2016-06-08 DIAGNOSIS — E873 Alkalosis: Secondary | ICD-10-CM | POA: Diagnosis not present

## 2016-06-08 DIAGNOSIS — E876 Hypokalemia: Secondary | ICD-10-CM | POA: Diagnosis present

## 2016-06-08 DIAGNOSIS — E785 Hyperlipidemia, unspecified: Secondary | ICD-10-CM | POA: Diagnosis present

## 2016-06-08 DIAGNOSIS — I340 Nonrheumatic mitral (valve) insufficiency: Secondary | ICD-10-CM | POA: Diagnosis not present

## 2016-06-08 DIAGNOSIS — Z681 Body mass index (BMI) 19 or less, adult: Secondary | ICD-10-CM

## 2016-06-08 DIAGNOSIS — F17200 Nicotine dependence, unspecified, uncomplicated: Secondary | ICD-10-CM | POA: Insufficient documentation

## 2016-06-08 DIAGNOSIS — J90 Pleural effusion, not elsewhere classified: Secondary | ICD-10-CM | POA: Diagnosis present

## 2016-06-08 DIAGNOSIS — E860 Dehydration: Secondary | ICD-10-CM | POA: Diagnosis present

## 2016-06-08 DIAGNOSIS — R4182 Altered mental status, unspecified: Secondary | ICD-10-CM

## 2016-06-08 DIAGNOSIS — R0902 Hypoxemia: Secondary | ICD-10-CM

## 2016-06-08 DIAGNOSIS — I639 Cerebral infarction, unspecified: Secondary | ICD-10-CM

## 2016-06-08 DIAGNOSIS — J9690 Respiratory failure, unspecified, unspecified whether with hypoxia or hypercapnia: Secondary | ICD-10-CM | POA: Insufficient documentation

## 2016-06-08 DIAGNOSIS — Z4659 Encounter for fitting and adjustment of other gastrointestinal appliance and device: Secondary | ICD-10-CM

## 2016-06-08 DIAGNOSIS — J9601 Acute respiratory failure with hypoxia: Secondary | ICD-10-CM | POA: Diagnosis present

## 2016-06-08 DIAGNOSIS — I4891 Unspecified atrial fibrillation: Secondary | ICD-10-CM

## 2016-06-08 DIAGNOSIS — J449 Chronic obstructive pulmonary disease, unspecified: Secondary | ICD-10-CM | POA: Diagnosis present

## 2016-06-08 DIAGNOSIS — J42 Unspecified chronic bronchitis: Secondary | ICD-10-CM

## 2016-06-08 HISTORY — PX: EP IMPLANTABLE DEVICE: SHX172B

## 2016-06-08 LAB — COMPREHENSIVE METABOLIC PANEL
ALK PHOS: 112 U/L (ref 38–126)
ALT: 60 U/L — ABNORMAL HIGH (ref 14–54)
ANION GAP: 10 (ref 5–15)
AST: 54 U/L — ABNORMAL HIGH (ref 15–41)
Albumin: 2.9 g/dL — ABNORMAL LOW (ref 3.5–5.0)
BUN: 22 mg/dL — ABNORMAL HIGH (ref 6–20)
CALCIUM: 8.6 mg/dL — AB (ref 8.9–10.3)
CO2: 27 mmol/L (ref 22–32)
Chloride: 100 mmol/L — ABNORMAL LOW (ref 101–111)
Creatinine, Ser: 1.07 mg/dL — ABNORMAL HIGH (ref 0.44–1.00)
GFR calc non Af Amer: 46 mL/min — ABNORMAL LOW (ref >60)
GFR, EST AFRICAN AMERICAN: 53 mL/min — AB (ref >60)
Glucose, Bld: 148 mg/dL — ABNORMAL HIGH (ref 65–99)
POTASSIUM: 3.7 mmol/L (ref 3.5–5.1)
SODIUM: 137 mmol/L (ref 135–145)
TOTAL PROTEIN: 5.8 g/dL — AB (ref 6.5–8.1)
Total Bilirubin: 1.1 mg/dL (ref 0.3–1.2)

## 2016-06-08 LAB — CBC WITH DIFFERENTIAL/PLATELET
BASOS PCT: 0 %
Basophils Absolute: 0 10*3/uL (ref 0.0–0.1)
EOS ABS: 0 10*3/uL (ref 0.0–0.7)
Eosinophils Relative: 0 %
HCT: 43.1 % (ref 36.0–46.0)
HEMOGLOBIN: 13.7 g/dL (ref 12.0–15.0)
LYMPHS ABS: 0.5 10*3/uL — AB (ref 0.7–4.0)
LYMPHS PCT: 2 %
MCH: 27.3 pg (ref 26.0–34.0)
MCHC: 31.8 g/dL (ref 30.0–36.0)
MCV: 85.9 fL (ref 78.0–100.0)
MONO ABS: 0.9 10*3/uL (ref 0.1–1.0)
Monocytes Relative: 4 %
NEUTROS ABS: 21.4 10*3/uL — AB (ref 1.7–7.7)
Neutrophils Relative %: 94 %
Platelets: 237 10*3/uL (ref 150–400)
RBC: 5.02 MIL/uL (ref 3.87–5.11)
RDW: 27.6 % — AB (ref 11.5–15.5)
WBC: 22.8 10*3/uL — ABNORMAL HIGH (ref 4.0–10.5)

## 2016-06-08 LAB — POCT I-STAT 3, ART BLOOD GAS (G3+)
ACID-BASE DEFICIT: 1 mmol/L (ref 0.0–2.0)
BICARBONATE: 28.5 meq/L — AB (ref 20.0–24.0)
O2 SAT: 100 %
TCO2: 30 mmol/L (ref 0–100)
pCO2 arterial: 63.9 mmHg (ref 35.0–45.0)
pH, Arterial: 7.254 — ABNORMAL LOW (ref 7.350–7.450)
pO2, Arterial: 478 mmHg — ABNORMAL HIGH (ref 80.0–100.0)

## 2016-06-08 LAB — LACTIC ACID, PLASMA: LACTIC ACID, VENOUS: 2.1 mmol/L — AB (ref 0.5–2.0)

## 2016-06-08 LAB — GLUCOSE, CAPILLARY
GLUCOSE-CAPILLARY: 139 mg/dL — AB (ref 65–99)
Glucose-Capillary: 132 mg/dL — ABNORMAL HIGH (ref 65–99)
Glucose-Capillary: 161 mg/dL — ABNORMAL HIGH (ref 65–99)

## 2016-06-08 LAB — LIPID PANEL
CHOLESTEROL: 162 mg/dL (ref 0–200)
HDL: 61 mg/dL (ref >40)
LDL Cholesterol: 83 mg/dL (ref 0–99)
TRIGLYCERIDES: 92 mg/dL (ref <150)
Total CHOL/HDL Ratio: 2.7 RATIO
VLDL: 18 mg/dL (ref 0–40)

## 2016-06-08 LAB — HEMOGLOBIN: HEMOGLOBIN: 13.9 g/dL (ref 12.0–16.0)

## 2016-06-08 LAB — MAGNESIUM: Magnesium: 1.8 mg/dL (ref 1.7–2.4)

## 2016-06-08 LAB — PROTIME-INR
INR: 1.14 (ref 0.00–1.49)
PROTHROMBIN TIME: 14.8 s (ref 11.6–15.2)

## 2016-06-08 LAB — PHOSPHORUS: PHOSPHORUS: 5.5 mg/dL — AB (ref 2.5–4.6)

## 2016-06-08 LAB — HEPARIN LEVEL (UNFRACTIONATED): HEPARIN UNFRACTIONATED: 0.2 [IU]/mL — AB (ref 0.30–0.70)

## 2016-06-08 LAB — APTT: aPTT: 33 seconds (ref 24–37)

## 2016-06-08 SURGERY — PACEMAKER IMPLANT
Anesthesia: LOCAL

## 2016-06-08 MED ORDER — PANTOPRAZOLE SODIUM 40 MG PO PACK
40.0000 mg | PACK | ORAL | Status: DC
  Administered 2016-06-09 – 2016-06-10 (×2): 40 mg
  Filled 2016-06-08 (×2): qty 20

## 2016-06-08 MED ORDER — AMIODARONE HCL IN DEXTROSE 360-4.14 MG/200ML-% IV SOLN
60.0000 mg/h | INTRAVENOUS | Status: AC
  Administered 2016-06-08 (×2): 60 mg/h via INTRAVENOUS

## 2016-06-08 MED ORDER — AMIODARONE LOAD VIA INFUSION
150.0000 mg | Freq: Once | INTRAVENOUS | Status: DC
  Filled 2016-06-08: qty 83.34

## 2016-06-08 MED ORDER — HEPARIN (PORCINE) IN NACL 2-0.9 UNIT/ML-% IJ SOLN
INTRAMUSCULAR | Status: DC | PRN
  Administered 2016-06-08: 16:00:00

## 2016-06-08 MED ORDER — LIDOCAINE HCL (PF) 1 % IJ SOLN
INTRAMUSCULAR | Status: AC
  Filled 2016-06-08: qty 60

## 2016-06-08 MED ORDER — MIDAZOLAM HCL 5 MG/5ML IJ SOLN
INTRAMUSCULAR | Status: DC | PRN
  Administered 2016-06-08: 1 mg via INTRAVENOUS

## 2016-06-08 MED ORDER — METOPROLOL TARTRATE 5 MG/5ML IV SOLN
INTRAVENOUS | Status: DC | PRN
  Administered 2016-06-08: 1 mg via INTRAVENOUS
  Administered 2016-06-08 (×2): 2 mg via INTRAVENOUS

## 2016-06-08 MED ORDER — METOPROLOL TARTRATE 5 MG/5ML IV SOLN
INTRAVENOUS | Status: AC
  Filled 2016-06-08: qty 5

## 2016-06-08 MED ORDER — ETOMIDATE 2 MG/ML IV SOLN
20.0000 mg | Freq: Once | INTRAVENOUS | Status: AC
Start: 2016-06-08 — End: 2016-06-08
  Administered 2016-06-08: 20 mg via INTRAVENOUS

## 2016-06-08 MED ORDER — MIDAZOLAM HCL 2 MG/2ML IJ SOLN
2.0000 mg | Freq: Once | INTRAMUSCULAR | Status: AC
  Administered 2016-06-08: 2 mg via INTRAVENOUS

## 2016-06-08 MED ORDER — SODIUM CHLORIDE 0.9 % IV BOLUS (SEPSIS)
1000.0000 mL | Freq: Once | INTRAVENOUS | Status: AC
  Administered 2016-06-08: 1000 mL via INTRAVENOUS

## 2016-06-08 MED ORDER — IPRATROPIUM-ALBUTEROL 0.5-2.5 (3) MG/3ML IN SOLN
3.0000 mL | Freq: Four times a day (QID) | RESPIRATORY_TRACT | Status: DC
  Administered 2016-06-08 – 2016-06-12 (×15): 3 mL via RESPIRATORY_TRACT
  Filled 2016-06-08 (×16): qty 3

## 2016-06-08 MED ORDER — HEPARIN BOLUS VIA INFUSION
600.0000 [IU] | Freq: Once | INTRAVENOUS | Status: AC
  Administered 2016-06-08: 600 [IU] via INTRAVENOUS
  Filled 2016-06-08: qty 600

## 2016-06-08 MED ORDER — LIDOCAINE HCL (PF) 1 % IJ SOLN
INTRAMUSCULAR | Status: DC | PRN
  Administered 2016-06-08: 86 mL via INTRADERMAL

## 2016-06-08 MED ORDER — FLUMAZENIL 1 MG/10ML IV SOLN
INTRAVENOUS | Status: DC | PRN
  Administered 2016-06-08: 2 mg via INTRAVENOUS

## 2016-06-08 MED ORDER — AMIODARONE HCL IN DEXTROSE 360-4.14 MG/200ML-% IV SOLN
60.0000 mg/h | INTRAVENOUS | Status: DC
  Administered 2016-06-08 (×3): 60 mg/h via INTRAVENOUS
  Filled 2016-06-08: qty 200

## 2016-06-08 MED ORDER — AMIODARONE HCL IN DEXTROSE 360-4.14 MG/200ML-% IV SOLN: 30.0000 mg/h | INTRAVENOUS | Status: DC

## 2016-06-08 MED ORDER — FLUMAZENIL 1 MG/10ML IV SOLN
INTRAVENOUS | Status: AC
  Filled 2016-06-08: qty 10

## 2016-06-08 MED ORDER — MIDAZOLAM HCL 2 MG/2ML IJ SOLN
1.0000 mg | INTRAMUSCULAR | Status: DC | PRN
  Administered 2016-06-09 – 2016-06-10 (×2): 1 mg via INTRAVENOUS
  Filled 2016-06-08 (×2): qty 2

## 2016-06-08 MED ORDER — SODIUM CHLORIDE 0.9 % IV SOLN: INTRAVENOUS | Status: DC

## 2016-06-08 MED ORDER — FENTANYL CITRATE (PF) 100 MCG/2ML IJ SOLN
50.0000 ug | INTRAMUSCULAR | Status: DC | PRN
  Administered 2016-06-09: 50 ug via INTRAVENOUS
  Filled 2016-06-08: qty 2

## 2016-06-08 MED ORDER — CHLORHEXIDINE GLUCONATE 4 % EX LIQD: 60.0000 mL | Freq: Once | CUTANEOUS | Status: DC

## 2016-06-08 MED ORDER — CHLORHEXIDINE GLUCONATE 4 % EX LIQD
60.0000 mL | Freq: Once | CUTANEOUS | Status: AC
  Administered 2016-06-08: 4 via TOPICAL

## 2016-06-08 MED ORDER — HEPARIN (PORCINE) IN NACL 2-0.9 UNIT/ML-% IJ SOLN
INTRAMUSCULAR | Status: AC
  Filled 2016-06-08: qty 500

## 2016-06-08 MED ORDER — VANCOMYCIN HCL IN DEXTROSE 1-5 GM/200ML-% IV SOLN
INTRAVENOUS | Status: AC
  Filled 2016-06-08: qty 200

## 2016-06-08 MED ORDER — ANTISEPTIC ORAL RINSE SOLUTION (CORINZ)
7.0000 mL | Freq: Four times a day (QID) | OROMUCOSAL | Status: DC
  Administered 2016-06-09 – 2016-06-10 (×8): 7 mL via OROMUCOSAL

## 2016-06-08 MED ORDER — LEVOFLOXACIN IN D5W 750 MG/150ML IV SOLN
750.0000 mg | Freq: Once | INTRAVENOUS | Status: AC
  Administered 2016-06-08: 750 mg via INTRAVENOUS
  Filled 2016-06-08: qty 150

## 2016-06-08 MED ORDER — CHLORHEXIDINE GLUCONATE 0.12% ORAL RINSE (MEDLINE KIT)
15.0000 mL | Freq: Two times a day (BID) | OROMUCOSAL | Status: DC
  Administered 2016-06-08 – 2016-06-10 (×5): 15 mL via OROMUCOSAL

## 2016-06-08 MED ORDER — AMIODARONE HCL IN DEXTROSE 360-4.14 MG/200ML-% IV SOLN
30.0000 mg/h | INTRAVENOUS | Status: DC
  Administered 2016-06-09 – 2016-06-11 (×5): 30 mg/h via INTRAVENOUS
  Filled 2016-06-08 (×8): qty 200

## 2016-06-08 MED ORDER — AMIODARONE HCL IN DEXTROSE 360-4.14 MG/200ML-% IV SOLN
INTRAVENOUS | Status: AC
  Filled 2016-06-08: qty 200

## 2016-06-08 MED ORDER — FENTANYL CITRATE (PF) 100 MCG/2ML IJ SOLN
100.0000 ug | Freq: Once | INTRAMUSCULAR | Status: AC
  Administered 2016-06-08: 100 ug via INTRAVENOUS

## 2016-06-08 MED ORDER — VANCOMYCIN HCL IN DEXTROSE 1-5 GM/200ML-% IV SOLN
1000.0000 mg | INTRAVENOUS | Status: AC
  Administered 2016-06-08: 1000 mg via INTRAVENOUS

## 2016-06-08 MED ORDER — FUROSEMIDE 10 MG/ML IJ SOLN
INTRAMUSCULAR | Status: DC | PRN
  Administered 2016-06-08: 40 mg via INTRAVENOUS

## 2016-06-08 MED ORDER — FUROSEMIDE 10 MG/ML IJ SOLN
INTRAMUSCULAR | Status: AC
  Filled 2016-06-08: qty 4

## 2016-06-08 MED ORDER — SODIUM CHLORIDE 0.9 % IR SOLN
Status: AC
  Filled 2016-06-08: qty 2

## 2016-06-08 MED ORDER — MIDAZOLAM HCL 5 MG/5ML IJ SOLN
INTRAMUSCULAR | Status: AC
  Filled 2016-06-08: qty 5

## 2016-06-08 MED ORDER — LEVOFLOXACIN IN D5W 500 MG/100ML IV SOLN
500.0000 mg | INTRAVENOUS | Status: DC
  Administered 2016-06-10: 500 mg via INTRAVENOUS
  Filled 2016-06-08 (×2): qty 100

## 2016-06-08 MED ORDER — ASPIRIN 300 MG RE SUPP
300.0000 mg | Freq: Every day | RECTAL | Status: DC
  Administered 2016-06-09 – 2016-06-10 (×2): 300 mg via RECTAL
  Filled 2016-06-08 (×3): qty 1

## 2016-06-08 MED ORDER — FENTANYL CITRATE (PF) 100 MCG/2ML IJ SOLN
INTRAMUSCULAR | Status: AC
  Filled 2016-06-08: qty 2

## 2016-06-08 MED ORDER — SODIUM CHLORIDE 0.9 % IR SOLN
80.0000 mg | Status: DC
  Administered 2016-06-08: 80 mg

## 2016-06-08 SURGICAL SUPPLY — 7 items
CABLE SURGICAL S-101-97-12 (CABLE) ×2 IMPLANT
LEAD TENDRIL MRI 46CM LPA1200M (Lead) ×2 IMPLANT
LEAD TENDRIL MRI 52CM LPA1200M (Lead) ×2 IMPLANT
PACEMAKER ASSURITY DR-RF (Pacemaker) ×2 IMPLANT
PAD DEFIB LIFELINK (PAD) ×2 IMPLANT
SHEATH CLASSIC 8F (SHEATH) ×4 IMPLANT
TRAY PACEMAKER INSERTION (PACKS) ×2 IMPLANT

## 2016-06-08 NOTE — Code Documentation (Signed)
Code stroke called to cath lab holding area at 1800.  Patient is post pacemaker placement today.  She was transferred from [facility] today for procedure.  Cath team reports she was LSW at 1600 by cath RN - who started sedation at 1605.  Patient was given 1 mg Versed IV per staff.  Cath team report that she never woke up post procedure after [employer] they noticed some left side weakness - and a right gaze.  Patient also presented with noisy gurgling respirations - PCCM callled - patient not protecting airway - was moved to [facility] for stat intubation.  On stroke team arrival - patient has been sedated with versed, fentanyl, and etomidate.  Neuro exam limited.  Delay to CT scan secondary to intubation and PCXR to verify ETT placement.  Difficult IV stick - multiple attempts to draw labs.  BP dropped post intubation - NS bolus started.  Patient taken to CT scan with Dr. Gifford Shave at bedside.  On return to unit - patient with increased tone right arm - will move to DPS - no effort against gravity.  Slight movement right leg to DPS.  BP recovered 138/75 - aflutter on monitor. Dr. Gifford Shave attempted to contact family by phone - they are not in hospital - no success.  No acute treatment per neuro.  Handoff to [employer].  NIHHS limited to sedation - initial scored as comatose patient - 36.

## 2016-06-08 NOTE — Progress Notes (Signed)
Pharmacy Antibiotic Note  Lydia [name] is a 82 y.o. female admitted on 06/08/2016 with for pacemaker placement non-responsive after.  Called Code Stroke.  Pharmacy has been consulted for Levofloxacin dosing.  Plan: Levofloxacin 750mg  x1 then 500mg  q48  Height: 5\' 6"  (167.6 cm) IBW/kg (Calculated) : 59.3  Temp (24hrs), Avg:97.8 F (36.6 C), Min:97.6 F (36.4 C), Max:98.2 F (36.8 C)   Recent Labs Lab 06/05/16 0705 06/06/16 0419  WBC 6.8 4.9  CREATININE 0.78 0.67    Estimated Creatinine Clearance: 31.8 mL/min (by C-G formula based on Cr of 0.67).    Allergies  Allergen Reactions  . Penicillins Hives    Antimicrobials this admission: Levofloxacin 6/12> vancomycin 1gm preop 6/12  Dose adjustments this admission:   Microbiology results:

## 2016-06-08 NOTE — Progress Notes (Signed)
RT assisted with patient transport to CT on vent. Patient's vital's remained stable.

## 2016-06-08 NOTE — Procedures (Signed)
Intubation Procedure Note ALVESTA ODELL [id number] 12-Mar-1927  Procedure: Intubation Indications: Respiratory insufficiency  Procedure Details Consent: Risks of procedure as well as the alternatives and risks of each were explained to the (patient/caregiver).  Consent for procedure obtained. Time Out: Verified patient identification, verified procedure, site/side was marked, verified correct patient position, special equipment/implants available, medications/allergies/relevent history reviewed, required imaging and test results available.  Performed  Maximum sterile technique was used including gloves and hand hygiene.  MAC and 3  Given 2 mg versed, 50 mcg fentanyl, 20 mg etomidate.  Inserted 7.5 ETT to 22 cm at lip.  Confirmed with auscultation and CO2 detector.  Evaluation Hemodynamic Status: BP stable throughout; O2 sats: stable throughout Patient's Current Condition: stable Complications: No apparent complications Patient did tolerate procedure well. Chest X-ray ordered to verify placement.  CXR: pending.   I supervised Stacy Allred, FNPI was present for entire procedure.  Chesley Mires, MD [facility] Pulmonary/Critical Care 06/08/2016, 6:15 PM Pager:  [phone] After 3pm call: [phone]

## 2016-06-08 NOTE — Progress Notes (Signed)
Contacted by nursing at 1:10 pm that patient was in atrial fib/flutter with rate in the 140s sustained Beta blocker was previously held this morning Will start amiodarone bolus with infusion in effort to restore normal sinus rhythm Will monitor for pauses and bradycardia Patient is asymptomatic

## 2016-06-08 NOTE — Progress Notes (Signed)
Called for additional report and clarification on Amiodarone orders.  [facility] RN states the patient did receive the 150mg  IV Amiodarone bolus.  LAbsher RN

## 2016-06-08 NOTE — Progress Notes (Signed)
ANTICOAGULATION CONSULT NOTE -Follow Up Consult  Pharmacy Consult for Heparin Drip Indication: atrial fibrillation  Allergies  Allergen Reactions  . Penicillins Hives    Patient Measurements: Height: 5\' 6"  (167.6 cm) Weight: 88 lb (39.917 kg) IBW/kg (Calculated) : 59.3 Heparin Dosing Weight: 39.9 kg  Vital Signs: Temp: 98.2 F (36.8 C) (06/11 1933) Temp Source: Oral (06/11 1933) BP: 133/65 mmHg (06/11 1937) Pulse Rate: 91 (06/11 1937)  Labs:  Recent Labs  06/05/16 0705  06/05/16 1856 06/06/16 0419  06/07/16 0553 06/07/16 1502 06/07/16 2355  HGB 15.4  --   --  14.0  --   --   --   --   HCT 46.5  --   --  43.6  --   --   --   --   PLT 305  --   --  269  --   --   --   --   APTT  --   --  29  --   --   --   --   --   LABPROT  --   --  12.8  --   --   --   --   --   INR  --   --  0.94  --   --   --   --   --   HEPARINUNFRC  --   < > <0.10* 0.37  < > 0.22* 0.39 0.20*  CREATININE 0.78  --   --  0.67  --   --   --   --   < > = values in this interval not displayed.  Estimated Creatinine Clearance: 31.8 mL/min (by C-G formula based on Cr of 0.67).   Medical History: Past Medical History  Diagnosis Date  . Essential hypertension   . Osteoporosis   . Lower back pain   . History of stomach ulcers     a. s/p surgery ~ 40 yrs ago.  . Polycythemia     a. phlebotomy.  Marland Kitchen COPD (chronic obstructive pulmonary disease) ([place])   . IDA (iron deficiency anemia)     a. 04/2016 CT Abd: no malignancy.  . Weight loss, unintentional   . Thyroid nodule     a. 04/2016 large left thyroid nodule on CT chest  - rec dedicated u/s.  . Pulmonary nodule, left     a. 04/2016 CT chest: 68mm LUL nodule - f/u CT in 12 months (h/o smoking).  Marland Kitchen PAD (peripheral artery disease) ([place])     a. 04/2016 CT Abd: occluded prox LSFA.  . Superior mesenteric artery stenosis ([place])     a. 04/2016 CT Abd: atheromatous plaque and mural thrombus in SMA.  . Tobacco abuse     Medications:  Infusions:  . sodium  chloride    . heparin 800 Units/hr (06/07/16 [id number])    Assessment: 82 yo female admitted with new onset a fib.  6/10 at 11:57 HL = 0.30  Goal of Therapy:  Heparin level 0.3-0.7 units/ml Monitor platelets by anticoagulation protocol: Yes   Plan:  Heparin level subtherapeutic. 600 unit IV x 1 bolus and increase rate to 900 units/hr. Will recheck heparin level in 8 hours.   Laural Benes, [facility] Clinical Pharmacist 06/08/2016, 1:04 AM

## 2016-06-08 NOTE — H&P (Signed)
PULMONARY / CRITICAL CARE MEDICINE   Name: Lydia [name] MRN: [id number] DOB: 1927-03-10    ADMISSION DATE:  06/08/2016 CONSULTATION DATE:  6/12  REFERRING MD:  Dr. Lovena Le EP  CHIEF COMPLAINT:  Unresponsive s/p pacemaker placement  HISTORY OF PRESENT ILLNESS:   82 y.o. female with a known history of Hypertension, COPD, iron deficiency anemia and polycythemia. The patient has had a severe weight loss at about 50 pound with uncertain etiology. She was at [facility] having pacemaker placed. PCCM was consulted for unresponsiveness and left sided weakness s/p procedure. Discussion had with husband and son concerning code status, both of whom would like her to be a full code. A code stroke was called at approximately 1800 and she was transferred to [facility] for intubation and further evaluation.   PAST MEDICAL HISTORY :  She  has a past medical history of Essential hypertension; Osteoporosis; Lower back pain; History of stomach ulcers; Polycythemia; COPD (chronic obstructive pulmonary disease) (HCC); IDA (iron deficiency anemia); Weight loss, unintentional; Thyroid nodule; Pulmonary nodule, left; PAD (peripheral artery disease) ([place]); Superior mesenteric artery stenosis ([place]); Tobacco abuse; New onset atrial flutter ([place]) (05/2016); and Tachy-brady syndrome ([place]) (05/2016).  PAST SURGICAL HISTORY: She  has past surgical history that includes Blepharoplasty; Stomach surgery (1995); Colonoscopy; Cataract extraction w/PHACO (Right, 01/27/2016); and Colonoscopy (2005, 2010).  Allergies  Allergen Reactions  . Penicillins Hives    No current facility-administered medications on file prior to encounter.   Current Outpatient Prescriptions on File Prior to Encounter  Medication Sig  . Ascorbic Acid (VITAMIN C) 1000 MG tablet Take 1,000 mg by mouth daily.  Marland Kitchen CALCIUM PO Take by mouth daily. AM  . Cholecalciferol (VITAMIN D PO) Take 600 mg by mouth daily. AM  . omeprazole (PRILOSEC) 20 MG capsule   . polyethylene  glycol-electrolytes (NULYTELY/GOLYTELY) 420 g solution Take 420 g by mouth once.  . vitamin A 10000 UNIT capsule Take 10,000 Units by mouth daily.    FAMILY HISTORY:  Her has no family status information on file.   SOCIAL HISTORY: She  reports that she has been smoking Cigarettes.  She has a 65 pack-year smoking history. She has never used smokeless tobacco. She reports that she does not drink alcohol or use illicit drugs.  REVIEW OF SYSTEMS:   Negative except HPI  SUBJECTIVE:    VITAL SIGNS: There were no vitals taken for this visit.  HEMODYNAMICS:    VENTILATOR SETTINGS:    INTAKE / OUTPUT: I/O last 3 completed shifts: In: 240 [P.O.:240] Out: 2200 [Urine:2200]  PHYSICAL EXAMINATION: General:  Very frail cachetic,elderly female, unresponsive with sats 80-84% Neuro: unresponsive GCS 6 (E:4, V:1, M: 1). Spontaneous movement on right side, no movement noted to left side to pain.  HEENT: PERRL, mucous membranes dry Cardiovascular:  Afib/Aflutter on monitor, No JVD, No murmurs, discoloration noted to bilateral great toes.  Lungs:  Diminished but coarse breath sounds R>L, On 100% NRB Abdomen: flat, + BS,  Musculoskeletal: moving RLE and RUE spontaneously, No movement to left side Skin: Very dry skin, tenting noted to all extremities.   LABS:  BMET  Recent Labs Lab 06/05/16 0705 06/06/16 0419  NA 138 140  K 4.1 4.1  CL 98* 104  CO2 32 31  BUN 24* 20  CREATININE 0.78 0.67  GLUCOSE 105* 95    Electrolytes  Recent Labs Lab 06/05/16 0705 06/05/16 1856 06/06/16 0419  CALCIUM 8.9  --  8.1*  MG  --  1.8  --  CBC  Recent Labs Lab 06/05/16 0705 06/06/16 0419 06/08/16 0826  WBC 6.8 4.9  --   HGB 15.4 14.0 13.9  HCT 46.5 43.6  --   PLT 305 269  --     Coag's  Recent Labs Lab 06/05/16 1856  APTT 29  INR 0.94    Sepsis Markers No results for input(s): LATICACIDVEN, PROCALCITON, O2SATVEN in the last 168 hours.  ABG No results for input(s):  PHART, PCO2ART, PO2ART in the last 168 hours.  Liver Enzymes No results for input(s): AST, ALT, ALKPHOS, BILITOT, ALBUMIN in the last 168 hours.  Cardiac Enzymes No results for input(s): TROPONINI, PROBNP in the last 168 hours.  Glucose  Recent Labs Lab 06/08/16 1744  GLUCAP 139*    Imaging Dg Chest Port 1 View  06/08/2016  CLINICAL DATA:  Unresponsive with left-sided weakness following pacemaker placement. EXAM: PORTABLE CHEST 1 VIEW COMPARISON:  Radiographs 04/04/2010.  CT 05/07/2016. FINDINGS: 1727 hours. Left subclavian pacemaker leads project over the right atrium and right ventricle. The heart size and mediastinal contours are stable allowing for patient rotation to the right. There is diffuse aortic atherosclerosis. There is vascular congestion with new asymmetric perihilar and lower lobe airspace disease on the left. No pneumothorax or significant pleural effusion identified. The bones appear unchanged. IMPRESSION: 1. The left subclavian pacemaker leads appear satisfactorily positioned in the right atrium and right ventricle. No pneumothorax. 2. New vascular congestion with asymmetric airspace disease on the left consistent with asymmetric edema or aspiration. Electronically Signed   By: Richardean Sale M.D.   On: 06/08/2016 17:44     STUDIES:  6/12: CXR 6/12: CT Brain 6/12: CTA Brain  CULTURES: 6/12: Sputum Culture  ANTIBIOTICS: 6/12: Levaquin>>>  SIGNIFICANT EVENTS:   LINES/TUBES: 6/12: PIV 6/12: ETT 6/12: Foley  DISCUSSION: Admitted to [facility] after becoming unresponsive after pacemaker placement. Code stroke initiated due to patient not moving left side. Patients husband and son notified of change in condition, verified that she is a full code and they would like her intubated. She was intubated on arrival to [facility]. CT/CTA completed. Neurology consulted.  ASSESSMENT / PLAN:  PULMONARY A: Acute Hypoxic Respiratory Failure COPD/Emphysema Possible Aspiration P:    Intubated VAP bundle Daily CXR/ABG Levaquin for possible aspiration Duonebs Q6 hours  CARDIOVASCULAR A:  Hypertension Afib/Aflutter P:  No home medications  Maintain SBP< 180 Remains in A.Fib/A.Flutter Will need anti-coagulation Recent ECHO (06/06/16) with EF 50-55%  RENAL A:   Possible dehydration - tenting of skin, dry mucous membranes P:   IVF for rehydration Electrolyte replacement per protocol  GASTROINTESTINAL A:   Weight Loss - Recent 50 Lb weight loss P:   Insert OGT/dobhoff for TF Monitor for refeeding Consult nutrition for recommendations  HEMATOLOGIC A:   Iron Deficiency Anemia Polycythemia P:  Followed by oncology SCD for DVT prophylaxis Anti-coagulation per neurology  INFECTIOUS A:   Possible Aspiration  P:   Intubated for hypoxic resp. Failure Possible aspiration during procedure  ENDOCRINE A:   R/O DM  P:   Follow BG on AM BMP SSI if needed Send HgBA1C for secondary stroke prevention  NEUROLOGIC A:   Left Sided Weakness Code Stroke P:   RASS goal: -1 F/U with Neurology recommendations for stroke CT head NAICP Unable to get MRI due to pacemaker  FAMILY  - Updates: Son and Husband updated on phone prior to intubation  - Inter-disciplinary family meet or Palliative Care meeting due by: 06/15/16   Georgann Housekeeper, AGACNP-BC [place]  Pulmonology/Critical Care Pager [phone] or [phone]  06/08/2016 8:16 PM  82 yo female was transferred from [facility] for pacer insertion.  After the procedure she developed altered mental status and left sided weakness.  She has hx of  A fib.  There was also concern for aspiration.  She was listed as DNR >> I d/w pt's husband and son, and neither were aware of DNR status.  She was then changed to full code.  Unresponsive, cachetic, decreased bs, hr irregular, abdomen soft, extremities mottled  CXR with Lt > Rt ASD, no PTX  Assessment/plan:  Acute hypoxic respiratory failure. - full vent  support, f/u CXR, ABG  Acute encephalopathy with concern for CVA. - code stroke called  Probable aspiration pneumonia. - levaquin (PCN allergy)  COPD/emphysema. - BDs - monitor for PEEPi on vent  A fib with RVR with pauses s/p pacer. - per cardiology  I spoke with Pt's husband and son over the phone.  D/w Dr. Lovena Le.  CC time by me independent of APP time and procedure time is 39 minutes.  Chesley Mires, MD [facility] Pulmonary/Critical Care 06/08/2016, 8:30 PM Pager:  [phone] After 3pm call: [phone]

## 2016-06-08 NOTE — Progress Notes (Signed)
Amiodarone drip started as per Md order, patient tolerating, family at bedside, heart rate 114. willl continue to monitor

## 2016-06-08 NOTE — Discharge Summary (Signed)
[facility] [place] NAME: Lydia [name]    MR#:  [id number]  DATE OF BIRTH:  Jan 23, 1927  DATE OF ADMISSION:  06/05/2016 ADMITTING PHYSICIAN: Demetrios Loll, MD  DATE OF DISCHARGE: 06/08/2016 PRIMARY CARE PHYSICIAN: Marinda Elk, MD    ADMISSION DIAGNOSIS:  leg and abd pain tachy-brady   DISCHARGE DIAGNOSIS:  New on set A fib with RVR, Mesenteric ischemia and PVD Malnutrition and weight loss SECONDARY DIAGNOSIS:   Past Medical History  Diagnosis Date  . Essential hypertension   . Osteoporosis   . Lower back pain   . History of stomach ulcers     a. s/p surgery ~ 40 yrs ago.  . Polycythemia     a. phlebotomy.  Marland Kitchen COPD (chronic obstructive pulmonary disease) ([place])   . IDA (iron deficiency anemia)     a. 04/2016 CT Abd: no malignancy.  . Weight loss, unintentional   . Thyroid nodule     a. 04/2016 large left thyroid nodule on CT chest  - rec dedicated u/s.  . Pulmonary nodule, left     a. 04/2016 CT chest: 46mm LUL nodule - f/u CT in 12 months (h/o smoking).  Marland Kitchen PAD (peripheral artery disease) ([place])     a. 04/2016 CT Abd: occluded prox LSFA.  . Superior mesenteric artery stenosis ([place])     a. 04/2016 CT Abd: atheromatous plaque and mural thrombus in SMA.  . Tobacco abuse     HOSPITAL COURSE:   New on set A fib with RVR, HR was 120-130's, back to sinus at 70's this am. cardizem drip was discontinued due to bradycardia. She was on by mouth Lopressor, which was discontinued by Dr. Caryl Comes. echo: LV EF: 50% - 0000000 systolic function.  Per cardiologist, Dr. Rockey Situ, transfer to [facility] hospital for [facility] placement later today with Dr. Lovena Le.    Mesenteric ischemia and PVD. She was on heparin drip, discontinued this am for possible PPM.  Follow-up Dr. Delana Meyer in the future for procedure after PPM.  Dehydration. Improved.  HTN, hold norvasc and lopressor per cardiologist.  COPD. Stable, NEB prn.  Polycythemia Follow up  Oncology as outpatient.  Malnutrition and weight loss. Dietitian consult.  I discussed with Dr. Rockey Situ and his PA, [place].  DISCHARGE CONDITIONS:   Guarded, transfer to [facility] hospital today.  CONSULTS OBTAINED:  Treatment Team:  Wende Bushy, MD Minna Merritts, MD [name], MD Leotis Pain, MD  DRUG ALLERGIES:   Allergies  Allergen Reactions  . Penicillins Hives    DISCHARGE MEDICATIONS:   Current Discharge Medication List    CONTINUE these medications which have NOT CHANGED   Details  Ascorbic Acid (VITAMIN C) 1000 MG tablet Take 1,000 mg by mouth daily.    Cholecalciferol (VITAMIN D PO) Take 600 mg by mouth daily. AM    polyethylene glycol-electrolytes (NULYTELY/GOLYTELY) 420 g solution Take 420 g by mouth once.    vitamin A 10000 UNIT capsule Take 10,000 Units by mouth daily.    CALCIUM PO Take by mouth daily. AM    omeprazole (PRILOSEC) 20 MG capsule    Associated Diagnoses: Iron deficiency anemia due to chronic blood loss      STOP taking these medications     amLODipine (NORVASC) 5 MG tablet      metoprolol (LOPRESSOR) 100 MG tablet          DISCHARGE INSTRUCTIONS:    If you experience worsening of your admission symptoms,  develop shortness of breath, life threatening emergency, suicidal or homicidal thoughts you must seek medical attention immediately by calling 911 or calling your MD immediately  if symptoms less severe.  You Must read complete instructions/literature along with all the possible adverse reactions/side effects for all the Medicines you take and that have been prescribed to you. Take any new Medicines after you have completely understood and accept all the possible adverse reactions/side effects.   Please note  You were cared for by a hospitalist during your hospital stay. If you have any questions about your discharge medications or the care you received while you were in the hospital after you are  discharged, you can call the unit and asked to speak with the hospitalist on call if the hospitalist that took care of you is not available. Once you are discharged, your primary care physician will handle any further medical issues. Please note that NO REFILLS for any discharge medications will be authorized once you are discharged, as it is imperative that you return to your primary care physician (or establish a relationship with a primary care physician if you do not have one) for your aftercare needs so that they can reassess your need for medications and monitor your lab values.    Today   SUBJECTIVE   No complaint.   VITAL SIGNS:  Blood pressure 142/64, pulse 72, temperature 97.7 F (36.5 C), temperature source Oral, resp. rate 18, height 5\' 6"  (1.676 m), weight 88 lb (39.917 kg), SpO2 100 %.  I/O:   Intake/Output Summary (Last 24 hours) at 06/08/16 1100 Last data filed at 06/08/16 0447  Gross per 24 hour  Intake    240 ml  Output    900 ml  Net   -660 ml    PHYSICAL EXAMINATION:  GENERAL:  82 y.o.-year-old patient lying in the bed with no acute distress. Thin. EYES: Pupils equal, round, reactive to light and accommodation. No scleral icterus. Extraocular muscles intact.  HEENT: Head atraumatic, normocephalic. Oropharynx and nasopharynx clear.  NECK:  Supple, no jugular venous distention. No thyroid enlargement, no tenderness.  LUNGS: Normal breath sounds bilaterally, no wheezing, rales,rhonchi or crepitation. No use of accessory muscles of respiration.  CARDIOVASCULAR: S1, S2 normal. No murmurs, rubs, or gallops.  ABDOMEN: Soft, non-tender, non-distended. Bowel sounds present. No organomegaly or mass.  EXTREMITIES: No pedal edema, cyanosis, or clubbing.  NEUROLOGIC: Cranial nerves II through XII are intact. Muscle strength 5/5 in all extremities. Sensation intact. Gait not checked.  PSYCHIATRIC: The patient is alert and oriented x 3.  SKIN: No obvious rash, lesion, or  ulcer.   DATA REVIEW:   CBC  Recent Labs Lab 06/06/16 0419 06/08/16 0826  WBC 4.9  --   HGB 14.0 13.9  HCT 43.6  --   PLT 269  --     Chemistries   Recent Labs Lab 06/05/16 1856 06/06/16 0419  NA  --  140  K  --  4.1  CL  --  104  CO2  --  31  GLUCOSE  --  95  BUN  --  20  CREATININE  --  0.67  CALCIUM  --  8.1*  MG 1.8  --     Cardiac Enzymes No results for input(s): TROPONINI in the last 168 hours.  Microbiology Results  No results found for this or any previous visit.  RADIOLOGY:  No results found.      Management plans discussed with the patient, her husband and they  are in agreement.  CODE STATUS:     Code Status Orders        Start     Ordered   06/05/16 0933  Do not attempt resuscitation (DNR)   Continuous    Question Answer Comment  In the event of cardiac or respiratory ARREST Do not call a "code blue"   In the event of cardiac or respiratory ARREST Do not perform Intubation, CPR, defibrillation or ACLS   In the event of cardiac or respiratory ARREST Use medication by any route, position, wound care, and other measures to relive pain and suffering. May use oxygen, suction and manual treatment of airway obstruction as needed for comfort.      06/05/16 0932    Code Status History    Date Active Date Inactive Code Status Order ID Comments User Context   This patient has a current code status but no historical code status.      TOTAL TIME TAKING CARE OF THIS PATIENT: 39 minutes.    Demetrios Loll M.D on 06/08/2016 at 11:00 AM  Between 7am to 6pm - Pager - [phone]  After 6pm go to [email or web address] - password [email or web address] [facility]  [facility]  Office  [phone]  CC: Primary care physician; Marinda Elk, MD

## 2016-06-08 NOTE — Discharge Instructions (Signed)
NPO for procedure.

## 2016-06-08 NOTE — Progress Notes (Signed)
Pt is now code code stroke. To 2H.

## 2016-06-08 NOTE — Progress Notes (Signed)
[facility] link at bedside to transport [facility] to [facility], report given to care linls over the phone patient remains on amoidorone drip at 33.3 ml/hr

## 2016-06-08 NOTE — Consult Note (Signed)
Neurology Consultation Reason for Consult: Stroke Referring Physician: Carleene Overlie Taylor,M.D.     HPI: Lydia [name] is a 82 y.o. female with history of severe COPD, peripheral vascular disease, recent history of 50 pounds weight loss initially transferred from outside facility Hospital where she presented with atrial fibrillation with RVR. Patient was getting a pacemaker procedure. Last known normal around 4 PM. During the end of the procedure patient was making gurgling noises and was not moving her left side. Patient was treated with the heparin until 3 AM last night Code stroke was activated around 6 PM. Patient was transferred to medical ICU for intubation to protect her airway. CT scan was done 6:50 PM which was personally reviewed by me which shows signs of severe small vessel disease without any acute findings.   Unable to obtained review of systems due to patient's mental status   Past Medical History  Diagnosis Date  . Essential hypertension   . Osteoporosis   . Lower back pain   . History of stomach ulcers     a. s/p surgery ~ 40 yrs ago.  . Polycythemia     a. phlebotomy.  Marland Kitchen COPD (chronic obstructive pulmonary disease) ([place])   . IDA (iron deficiency anemia)     a. 04/2016 CT Abd: no malignancy.  . Weight loss, unintentional   . Thyroid nodule     a. 04/2016 large left thyroid nodule on CT chest  - rec dedicated u/s.  . Pulmonary nodule, left     a. 04/2016 CT chest: 86mm LUL nodule - f/u CT in 12 months (h/o smoking).  Marland Kitchen PAD (peripheral artery disease) ([place])     a. 04/2016 CT Abd: occluded prox LSFA.  . Superior mesenteric artery stenosis ([place])     a. 04/2016 CT Abd: atheromatous plaque and mural thrombus in SMA.  . Tobacco abuse   . New onset atrial flutter ([place]) 05/2016  . Tachy-brady syndrome ([place]) 05/2016    a. to [facility] for PPM placement   Current Outpatient Prescriptions on File Prior to Encounter  Medication Sig  . Ascorbic Acid (VITAMIN C) 1000 MG tablet Take  1,000 mg by mouth daily.  Marland Kitchen CALCIUM PO Take by mouth daily. AM  . Cholecalciferol (VITAMIN D PO) Take 600 mg by mouth daily. AM  . omeprazole (PRILOSEC) 20 MG capsule   . polyethylene glycol-electrolytes (NULYTELY/GOLYTELY) 420 g solution Take 420 g by mouth once.  . vitamin A 10000 UNIT capsule          Family History  Problem Relation Age of Onset  . Breast cancer Sister   . Other      no premature CAD    Social History:  reports that she has been smoking Cigarettes.  She has a 65 pack-year smoking history. She has never used smokeless tobacco. She reports that she does not drink alcohol or use illicit drugs.   Exam: Current vital signs: Ht 5\' 6"  (1.676 m)  SpO2 100% Vital signs in last 24 hours: Temp:  [97.6 F (36.4 C)-98.2 F (36.8 C)] 97.8 F (36.6 C) (06/12 1344) Pulse Rate:  [0-295] 0 (06/12 1729) Resp:  [0-28] 0 (06/12 1729) BP: (103-164)/(61-113) 123/71 mmHg (06/12 1724) SpO2:  [0 %-100 %] 100 % (06/12 1815) FiO2 (%):  [100 %] 100 % (06/12 1815)  Physical Exam  General lying in bed intubated, looks very frail Neck supple without any lymphadenopathy Lungs coarse breath sounds CVS S1-S2 regular Skin very brittle Neurologic unresponsive, does not follow  any commands, spontaneous eye opening, intubated, she received some sedation earlier. Pupils are small but reactive bilaterally, face seems symmetrical    I have reviewed labs in epic and the results pertinent to this consultation are: INR 1.14, prothrombin time 14.8 APTT 33  I have reviewed the images obtained: Impression: Atrophic and chronic white matter ischemic changes.  Hyperdense lesion involving the falx near the vertex likely representing a meningioma. This measures approximately 18 mm in greatest dimension.  No acute abnormality is noted.  Assessment and recommendation This is a 83 year old female with multiple vascular risk factors including severe COPD, peripheral vascular  disease, recent history of atrial fibrillation. Patient is unresponsive and history of left-sided hemiparesis per nursing staff Patient likely suffered bilateral ischemic strokes associated with atrial fibrillation. Patient was not treated with IV TPA due to advanced age, time to obtain workup including CT scan of the head, endotracheal intubation, PT/INR. Which puts her out of the 3 hour window. She was not also underwent intra-arterial stroke intervention candidate due to severe vasculopathy. Case was discussed with primary cardiology team and ICU team. Continue symptomatic management. Will start patient on rectal aspirin. Maintain blood pressure between [id number] systolic Patient is unable to get an MRI due to pacemaker. Will repeat CT head and CTA head and neck tomorrow.    Lurena Joiner, MD 7:24 PM

## 2016-06-08 NOTE — Progress Notes (Signed)
Patient resting in the bed at this time, awaiting to be transfer to [facility] for pace maker placement , family at bedside, patient NSR on the monitor

## 2016-06-08 NOTE — Care Management (Signed)
Informed that patient is being transferred to [facility] for pacemaker in a patient with high co morbidities.

## 2016-06-08 NOTE — Care Management (Signed)
Patient with scheduled vascular procedure for left lower extremity edema and concern for mesenteric ischemia.  Patient found to be in atrial fib and heart rate since admission has ranged from 180 to  20.  There is concern for tachy brady syndrome.  Cardizem drip had to be discontinued due to brady cardia and sinus pauses.  She is currently on heparin drip due to need for vascular procedure anticipated for 6/13.

## 2016-06-08 NOTE — Care Management Important Message (Signed)
Important Message  Patient Details  Name: FLORESTINE GARRY MRN: [id number] Date of Birth: 17-Aug-1927   Medicare Important Message Given:  Yes    Katrina Stack, RN 06/08/2016, 10:17 AM

## 2016-06-08 NOTE — Progress Notes (Signed)
Pt arrived S/P PTVP insertion by Dr Lovena Le. Pt on non rebreather with o2 sats low nineties and labored breathing. Does not respond nor follow commands. Dr Lovena Le at bedside and states she is not a code stroke candidate and will receive supportive care only. HR 129 in what appears to be A-flutter. BP 197/100 by cuff, rr 30's. Operative site level zero. BP 164/93

## 2016-06-08 NOTE — Progress Notes (Signed)
Patient's HR has remained in the 110s-130s and increasing with ambulation. Stopped heparin drip at 0300 per orders for scheduled procedure at [facility] today and NPO since midnight. Patient rested comfortably overnight. Nursing staff will continue to monitor. Earleen Reaper, RN

## 2016-06-08 NOTE — Progress Notes (Signed)
Hospital Problem List     Active Problems:   New onset atrial fibrillation (HCC)   Protein-calorie malnutrition, severe   Tachycardia-bradycardia syndrome [facility])     Patient Profile:   Primary Cardiologist: New - Dr. Yvone Neu  10 y /o female with a h/o HTN, polycythemia, IDA, tobacco abuse, bilateral claudication, PAD, and unintentional weight loss who presented to [facility] on 06/05/2016 for peripheral angiography and was found to be in rapid atrial fibrillation.  Subjective   Denies any symptoms of chest pain or palpitations overnight. Was in atrial flutter with HR in the 130's. Converted to NSR this AM. Has been NPO since midnight.  Inpatient Medications    . docusate sodium  100 mg Oral BID  . nicotine  14 mg Transdermal Daily  . pantoprazole  40 mg Oral Daily  . sodium chloride flush  3 mL Intravenous Q12H    Vital Signs    Filed Vitals:   06/07/16 1934 06/07/16 1937 06/08/16 0445 06/08/16 0903  BP: 137/113 133/65 137/80 142/64  Pulse: 93 91 46 72  Temp:   97.7 F (36.5 C) 97.7 F (36.5 C)  TempSrc:   Oral Oral  Resp:   18 18  Height:      Weight:      SpO2: 94% 77% 96% 100%    Intake/Output Summary (Last 24 hours) at 06/08/16 1101 Last data filed at 06/08/16 0447  Gross per 24 hour  Intake    240 ml  Output    900 ml  Net   -660 ml   Filed Weights   06/05/16 0712  Weight: 88 lb (39.917 kg)    Physical Exam    General: Thin-appearing elderly Caucasian female, currently in no acute distress. Head: Normocephalic, atraumatic.  Neck: Supple without bruits, JVD not elevated. Lungs:  Resp regular and unlabored, CTA without wheezing or rales. Heart: RRR, S1, S2, no S3, S4, or murmur; no rub. Abdomen: Soft, non-tender, non-distended with normoactive bowel sounds. No hepatomegaly. No rebound/guarding. No obvious abdominal masses. Extremities: No clubbing, cyanosis, or edema. Distal pedal pulses are 2+ bilaterally. Neuro: Alert and oriented X 3. Moves all  extremities spontaneously. Psych: Normal affect.  Labs    CBC  Recent Labs  06/06/16 0419 06/08/16 0826  WBC 4.9  --   HGB 14.0 13.9  HCT 43.6  --   MCV 86.5  --   PLT 269  --    Basic Metabolic Panel  Recent Labs  06/05/16 1856 06/06/16 0419  NA  --  140  K  --  4.1  CL  --  104  CO2  --  31  GLUCOSE  --  95  BUN  --  20  CREATININE  --  0.67  CALCIUM  --  8.1*  MG 1.8  --    Thyroid Function Tests  Recent Labs  06/05/16 1856  TSH 1.673    Telemetry    Atrial flutter overnight with HR in the 120's - 130's. Converted to NSR at 0730 with HR in the 70's since. No significant pauses noted.  ECG    No new tracings.   Cardiac Studies and Radiology    No results found.  Echocardiogram: 06/06/2016 Study Conclusions - Left ventricle: The cavity size was mildly dilated. Wall  thickness was increased in a pattern of mild LVH. Systolic  function was normal. The estimated ejection fraction was in the  range of 50% to 55%. The study is not technically sufficient to  allow evaluation of LV diastolic function. - Aortic valve: Calcified non coronary cusp. - Mitral valve: There was mild regurgitation. - Left atrium: The atrium was mildly dilated. - Right atrium: The atrium was mildly dilated. - Atrial septum: No defect or patent foramen ovale was identified. - Pericardium, extracardiac: Small localized posterior lateral  effusion  Assessment & Plan    1. New Onset Afib/flutter with RVR - Pt presented to [facility] on 06/05/2016 for elective peripheral angiography related to L>R LE claudication and recent finding of LSFA occlusion, and was incidentally noted to be in rapid afib/flutter.Rates initially in the 140's at times and was placed on IV Diltiazem with pauses of up to 3.97 seconds, and has also been in and out of AF with rates in the mid-50's while in sinus. She is completely asymptomatic.  - This patients CHA2DS2-VASc Score and unadjusted Ischemic Stroke  Rate (% per year) is equal to 7.2 % stroke rate/year from a score of 5 (HTN, Age (2), Female, Vascular Disease). Continue IV Heparin.  - evaluated by Dr. Caryl Comes over the weekend and found to have pauses up to 6 seconds. From his note on 06/07/2016, she was in NSR at that time, he mentioned "in the event that she has further atrial fibrillation I would have a low threshold for proceeding with pacing because of the tachybradycardia manifestations. If she were to revert to atrial fibrillation between now and tomorrow, I would transfer her to [facility] as anticipated; she is on the board at [facility] for pacing tomorrow afternoon". Since then, she was in atrial flutter overnight with HR in the 120's - 130's. Converted to NSR this AM with HR in the 60's - 70's since. - have contacted [facility] to arrange transport to [facility] for PPM placement later today with Dr. Lovena Le. Dr. Bridgett Larsson here has been made aware along with Card Master at [facility].  2.PAD/L>R Claudication - had initially presented for peripheral angiography.  - deferred for now following further cardiac workup.  3. Essential HTN - BP has been 133/64 - 142/113 in the past 24 hours. - will hold Lopressor this AM per Dr. Lovena Le.   4. COPD/ongoing tobacco abuse - still smoking 1ppd. She is not intending to quit. - Cessation advised.  5. Lung Nodules - Needs f/u CT in a year per radiology recs.  6. Thyroid Nodule - TSH normal at 1.673 this admission.  7.Polycythemia/IDA - H/H stable with Hgb at 13.9 today.  - Followed by oncology as outpt.  Signed, Erma Heritage , PA-C 11:01 AM 06/08/2016 Pager: [phone]

## 2016-06-08 NOTE — H&P (Signed)
HPI:  The patient is a pleasant elderly woman who was admitted 6/9 for anticipated PAD intervention for questionable mesenteric ischemia in the context of an unexplained ongoing weight loss. lower extremity occlusion. She was found to be in atrial fibrillation with a rapid rate and was subsequently admitted. She is unaware of palpitations. She denies prior transient neurological events. She does have symptoms of fatigue.  She continues with paroxysms of rapid atrial fibrillation and rates up to 180 and pauses of up to 5 seconds  As well as rates into the 20s.  Because of anticipated angiography early next week, it was elected to start her on heparin and not begi n oral anticoagulation  Echocardiogram ordered yesterday is not Available  Her weight loss issue back after about 3 or 4 years. Fatigue is bigger problem 1-2 years.   old records from care everywhere were reviewed. I cannot find a prior electrocardiogram. Heart rates recorded from physical examinations from 80-95; I only reviewed a few.   Past Medical History  Diagnosis Date  . Essential hypertension   . Osteoporosis   . Lower back pain   . History of stomach ulcers     a. s/p surgery ~ 40 yrs ago.  . Polycythemia     a. phlebotomy.  Marland Kitchen COPD (chronic obstructive pulmonary disease) ([place])   . IDA (iron deficiency anemia)     a. 04/2016 CT Abd: no malignancy.  . Weight loss, unintentional   . Thyroid nodule     a. 04/2016 large left thyroid nodule on CT chest - rec dedicated u/s.  . Pulmonary nodule, left     a. 04/2016 CT chest: 68mm LUL nodule - f/u CT in 12 months (h/o smoking).  Marland Kitchen PAD (peripheral artery disease) ([place])     a. 04/2016 CT Abd: occluded prox LSFA.  . Superior mesenteric artery stenosis ([place])     a. 04/2016 CT Abd: atheromatous plaque and mural thrombus in SMA.  . Tobacco abuse     Scheduled Meds:  Scheduled Meds: . metoprolol 50 mg  Oral BID  . nicotine 14 mg Transdermal Daily  . pantoprazole 40 mg Oral Daily  . pneumococcal 23 valent vaccine 0.5 mL Intramuscular Tomorrow-1000  . sodium chloride flush 3 mL Intravenous Q12H   Continuous Infusions: . sodium chloride 75 mL/hr at 06/06/16 1018  . heparin 700 Units/hr (06/05/16 2010)   acetaminophen **OR** acetaminophen, albuterol, ondansetron **OR** ondansetron (ZOFRAN) IV    PHYSICAL EXAM  Well developed and cachectic in no acute distress HENT normal Neck supple with JVP-flat Carotids brisk and full without bruits Clear Irregularly irregular rate and rhythm with controlled ventricular response, no murmurs or gallops Abd-soft with active BS without hepatomegaly No Clubbing cyanosis edema Skin-warm and dry A & Oriented Grossly normal sensory and motor function   TELEMETRY: Reviewed telemetry pt in afib rates 20's--180:    Intake/Output Summary (Last 24 hours) at 06/06/16 1057 Last data filed at 06/06/16 1014  Gross per 24 hour  Intake  240 ml  Output  1850 ml  Net -1610 ml    LABS: Basic Metabolic Panel:  Last Labs      Recent Labs Lab 06/05/16 0705 06/05/16 1856 06/06/16 0419  NA 138 --  140  K 4.1 --  4.1  CL 98* --  104  CO2 32 --  31  GLUCOSE 105* --  95  BUN 24* --  20  CREATININE 0.78 --  0.67  CALCIUM 8.9 --  8.1*  MG --  1.8 --      Cardiac Enzymes:  Recent Labs (last 2 labs)     No results for input(s): CKTOTAL, CKMB, CKMBINDEX, TROPONINI in the last 72 hours.   CBC:  Last Labs      Recent Labs Lab 06/05/16 0705 06/06/16 0419  WBC 6.8 4.9  HGB 15.4 14.0  HCT 46.5 43.6  MCV 83.6 86.5  PLT 305 269     PROTIME:  Recent Labs (last 2 labs)      Recent Labs  06/05/16 1856  LABPROT 12.8  INR 0.94     Liver Function Tests:  Recent Labs (last 2 labs)     No results for input(s): AST, ALT,  ALKPHOS, BILITOT, PROT, ALBUMIN in the last 72 hours.    Recent Labs (last 2 labs)     No results for input(s): LIPASE, AMYLASE in the last 72 hours.   BNP: BNP (last 3 results)  Recent Labs (within last 365 days)    No results for input(s): BNP in the last 8760 hours.    ProBNP (last 3 results)  Recent Labs (within last 365 days)    No results for input(s): PROBNP in the last 8760 hours.    D-Dimer:  Recent Labs (last 2 labs)     No results for input(s): DDIMER in the last 72 hours.   Hemoglobin A1C:  Recent Labs (last 2 labs)     No results for input(s): HGBA1C in the last 72 hours.   Fasting Lipid Panel:  Recent Labs (last 2 labs)     No results for input(s): CHOL, HDL, LDLCALC, TRIG, CHOLHDL, LDLDIRECT in the last 72 hours.   Thyroid Function Tests:  Recent Labs (last 2 labs)      Recent Labs  06/05/16 1856  TSH 1.673       ASSESSMENT AND PLAN:  Active Problems:  Atrial fibrillation (HCC)  Protein-calorie malnutrition, severe  Tachycardia-bradycardia syndrome ([place])  The patient has atrial fibrillation-persistent with rates ranging from the 20s--180s. [name] also had pauses of almost 6 seconds, and I think it is this latter issue which is the most compelling of her immediate needs. Puts her at a significant risk for fall which could be devastating given her nutritional status and risk for fracture. Hence, I think it should take precedence over her angiography which at least as best as I can tell from the patient is a more chronic issue having been associated over 3 years with early satiety and weight loss.   We will have to review this with vascular surgery.  I'm also wondering, based on her history, whether her leg problem is vascular and she says while it may be aggravated by walking and does not relieve with rest.  We will await her echocardiogram.  In the event that we're to proceed with pacing, want to discontinue her heparin and start her on a  NOAC.     EP Attending  Patient seen and examined. Discussed above with Dr. Renaldo Reel. Martyn Malay that She is a pleasant elderly woman with clear lungs, and IRIR rhythm and no edema. She is frail and very thin with symptomatic tachy-brady syndrome as long pauses as well as atrial fib with a  RVR. She has gone in and out of atrial fibrillation with a slow and rapid VR. She has no other good medical options and is referred for PPM insertion.  A/P 1. Atrial fib with a fast and slow VR - I have discussed the treatment options  with the patient. PPM insertion has been discussed and she wishes to proceed.  2. Sinus node dysfunction - hopefully her symptoms will resolve after PPM insertion.  3. Unexplained weight loss. Will plan to use a MRI compatible PPM.  Mikle Bosworth.D.

## 2016-06-09 ENCOUNTER — Encounter (HOSPITAL_COMMUNITY): Payer: Self-pay | Admitting: Internal Medicine

## 2016-06-09 ENCOUNTER — Inpatient Hospital Stay (HOSPITAL_COMMUNITY): Payer: Medicare HMO

## 2016-06-09 DIAGNOSIS — J9601 Acute respiratory failure with hypoxia: Secondary | ICD-10-CM | POA: Insufficient documentation

## 2016-06-09 DIAGNOSIS — J690 Pneumonitis due to inhalation of food and vomit: Secondary | ICD-10-CM

## 2016-06-09 DIAGNOSIS — J189 Pneumonia, unspecified organism: Secondary | ICD-10-CM | POA: Insufficient documentation

## 2016-06-09 DIAGNOSIS — I495 Sick sinus syndrome: Secondary | ICD-10-CM

## 2016-06-09 DIAGNOSIS — J441 Chronic obstructive pulmonary disease with (acute) exacerbation: Secondary | ICD-10-CM | POA: Insufficient documentation

## 2016-06-09 DIAGNOSIS — L8990 Pressure ulcer of unspecified site, unspecified stage: Secondary | ICD-10-CM | POA: Insufficient documentation

## 2016-06-09 LAB — GLUCOSE, CAPILLARY
GLUCOSE-CAPILLARY: 136 mg/dL — AB (ref 65–99)
GLUCOSE-CAPILLARY: 126 mg/dL — AB (ref 65–99)
GLUCOSE-CAPILLARY: 122 mg/dL — AB (ref 65–99)

## 2016-06-09 LAB — BASIC METABOLIC PANEL
ANION GAP: 13 (ref 5–15)
BUN: 24 mg/dL — ABNORMAL HIGH (ref 6–20)
CHLORIDE: 99 mmol/L — AB (ref 101–111)
CO2: 24 mmol/L (ref 22–32)
Calcium: 8.4 mg/dL — ABNORMAL LOW (ref 8.9–10.3)
Creatinine, Ser: 0.99 mg/dL (ref 0.44–1.00)
GFR calc Af Amer: 58 mL/min — ABNORMAL LOW (ref >60)
GFR calc non Af Amer: 50 mL/min — ABNORMAL LOW (ref >60)
GLUCOSE: 175 mg/dL — AB (ref 65–99)
POTASSIUM: 3.1 mmol/L — AB (ref 3.5–5.1)
Sodium: 136 mmol/L (ref 135–145)

## 2016-06-09 LAB — CBC
HEMATOCRIT: 39.5 % (ref 36.0–46.0)
HEMOGLOBIN: 12.2 g/dL (ref 12.0–15.0)
MCH: 26.2 pg (ref 26.0–34.0)
MCHC: 30.9 g/dL (ref 30.0–36.0)
MCV: 84.9 fL (ref 78.0–100.0)
Platelets: 205 10*3/uL (ref 150–400)
RBC: 4.65 MIL/uL (ref 3.87–5.11)
RDW: 27.5 % — ABNORMAL HIGH (ref 11.5–15.5)
WBC: 15.8 10*3/uL — ABNORMAL HIGH (ref 4.0–10.5)

## 2016-06-09 LAB — MRSA PCR SCREENING: MRSA BY PCR: NEGATIVE

## 2016-06-09 LAB — PROCALCITONIN: Procalcitonin: 0.43 ng/mL

## 2016-06-09 LAB — LACTIC ACID, PLASMA
Lactic Acid, Venous: 1.5 mmol/L (ref 0.5–2.0)
Lactic Acid, Venous: 1.4 mmol/L (ref 0.5–2.0)

## 2016-06-09 MED ORDER — HEPARIN (PORCINE) IN NACL 100-0.45 UNIT/ML-% IJ SOLN
1000.0000 [IU]/h | INTRAMUSCULAR | Status: DC
  Administered 2016-06-09: 500 [IU]/h via INTRAVENOUS
  Filled 2016-06-09 (×2): qty 250

## 2016-06-09 MED ORDER — VANCOMYCIN HCL IN DEXTROSE 1-5 GM/200ML-% IV SOLN
1000.0000 mg | INTRAVENOUS | Status: AC
  Filled 2016-06-09: qty 200

## 2016-06-09 MED ORDER — STROKE: EARLY STAGES OF RECOVERY BOOK
Freq: Once | Status: AC
  Administered 2016-06-09: 1
  Filled 2016-06-09: qty 1

## 2016-06-09 MED ORDER — METHYLPREDNISOLONE SODIUM SUCC 40 MG IJ SOLR
20.0000 mg | Freq: Two times a day (BID) | INTRAMUSCULAR | Status: DC
  Administered 2016-06-09 – 2016-06-10 (×4): 20 mg via INTRAVENOUS
  Filled 2016-06-09 (×4): qty 1

## 2016-06-09 MED ORDER — IOPAMIDOL (ISOVUE-370) INJECTION 76%
INTRAVENOUS | Status: AC
  Administered 2016-06-09: 50 mL
  Filled 2016-06-09: qty 50

## 2016-06-09 MED ORDER — ATORVASTATIN CALCIUM 10 MG PO TABS
10.0000 mg | ORAL_TABLET | Freq: Every day | ORAL | Status: DC
  Administered 2016-06-09 – 2016-06-15 (×6): 10 mg via ORAL
  Filled 2016-06-09 (×6): qty 1

## 2016-06-09 MED ORDER — BUDESONIDE 0.5 MG/2ML IN SUSP
0.5000 mg | Freq: Two times a day (BID) | RESPIRATORY_TRACT | Status: DC
  Administered 2016-06-09 – 2016-06-16 (×14): 0.5 mg via RESPIRATORY_TRACT
  Filled 2016-06-09 (×14): qty 2

## 2016-06-09 MED ORDER — POTASSIUM CHLORIDE 10 MEQ/100ML IV SOLN
10.0000 meq | INTRAVENOUS | Status: AC
  Administered 2016-06-09 (×4): 10 meq via INTRAVENOUS
  Filled 2016-06-09 (×3): qty 100

## 2016-06-09 MED FILL — Flumazenil IV Soln 1 MG/10ML (0.1 MG/ML): INTRAVENOUS | Qty: 10 | Status: AC

## 2016-06-09 NOTE — Progress Notes (Addendum)
STROKE TEAM PROGRESS NOTE   HISTORY OF PRESENT ILLNESS (per record) Lydia [name] is a 82 y.o. female with history of severe COPD, peripheral vascular disease, recent history of 50 pound weight loss, initially transferred from [facility] where she presented with atrial fibrillation with RVR. Patient was getting a pacemaker procedure. Last known normal around 4 PM 06/08/2106 (LKW). During the end of the procedure patient was making gurgling noises and was not moving her left side. Patient was treated with IV heparin until 3 AM last night. Code stroke was activated around 6 PM. Patient was transferred to medical ICU for intubation to protect her airway. CT scan was done 6:50 PM which was reviewed and shows signs of severe small vessel disease without any acute findings. Patient was not administered IV t-PA.    SUBJECTIVE (INTERVAL HISTORY) I obtained extensive history of present illness from the patient's husband and daughter are present at the bedside and answered question She seems to be neurologically improving hands moving her left side well now. She is on amiodarone  drip for rate control.   OBJECTIVE Temp:  [97.4 F (36.3 C)-97.8 F (36.6 C)] 97.8 F (36.6 C) (06/13 0727) Pulse Rate:  [0-295] 50 (06/13 0727) Cardiac Rhythm:  [-] Atrial fibrillation (06/13 0500) Resp:  [0-29] 22 (06/13 0727) BP: (103-164)/(61-98) 139/67 mmHg (06/13 0727) SpO2:  [0 %-100 %] 100 % (06/13 0727) FiO2 (%):  [50 %-100 %] 50 % (06/13 0400) Weight:  [41.1 kg (90 lb 9.7 oz)] 41.1 kg (90 lb 9.7 oz) (06/12 2000)  CBC:  Recent Labs Lab 06/08/16 2052 06/09/16 0206  WBC 22.8* 15.8*  NEUTROABS 21.4*  --   HGB 13.7 12.2  HCT 43.1 39.5  MCV 85.9 84.9  PLT 237 [id number]    Basic Metabolic Panel:  Recent Labs Lab 06/05/16 1856  06/08/16 2052 06/09/16 0206  NA  --   < > 137 136  K  --   < > 3.7 3.1*  CL  --   < > 100* 99*  CO2  --   < > 27 24  GLUCOSE  --   < > 148* 175*  BUN  --   < > 22* 24*   CREATININE  --   < > 1.07* 0.99  CALCIUM  --   < > 8.6* 8.4*  MG 1.8  --  1.8  --   PHOS  --   --  5.5*  --   < > = values in this interval not displayed.  Lipid Panel:    Component Value Date/Time   CHOL 162 06/08/2016 2052   CHOL 175 11/08/2014 1341   TRIG 92 06/08/2016 2052   TRIG 109 11/08/2014 1341   HDL 61 06/08/2016 2052   HDL 68* 11/08/2014 1341   CHOLHDL 2.7 06/08/2016 2052   VLDL 18 06/08/2016 2052   VLDL 22 11/08/2014 1341   LDLCALC 83 06/08/2016 2052   LDLCALC 85 11/08/2014 1341   HgbA1c:  Lab Results  Component Value Date   HGBA1C 6.2 11/08/2014   Urine Drug Screen: No results found for: LABOPIA, COCAINSCRNUR, LABBENZ, AMPHETMU, THCU, LABBARB    IMAGING  Ct Head Wo Contrast  06/08/2016  CLINICAL DATA:  Left-sided weakness EXAM: CT HEAD WITHOUT CONTRAST TECHNIQUE: Contiguous axial images were obtained from the base of the skull through the vertex without intravenous contrast. COMPARISON:  None. FINDINGS: Bony calvarium is intact. No gross soft tissue abnormality is noted. Mild atrophic changes are noted as well  as scattered areas of chronic white matter ischemic change. A hemangioma is noted along the falx near the vertex scattered basal ganglia calcifications are seen. IMPRESSION: Atrophic and chronic white matter ischemic changes. Hyperdense lesion involving the falx near the vertex likely representing a meningioma. This measures approximately 18 mm in greatest dimension. No acute abnormality is noted. These results were called by telephone at the time of interpretation on 06/08/2016 at 7:01 pm to Dr. Gifford Shave, who verbally acknowledged these results. Electronically Signed   By: Inez Catalina M.D.   On: 06/08/2016 19:03   Dg Chest Port 1 View  06/09/2016  CLINICAL DATA:  Respiratory failure. EXAM: PORTABLE CHEST 1 VIEW COMPARISON:  06/08/2016. FINDINGS: Endotracheal tube is 2.2 cm above the carina. Cardiac pacer with lead tips in right atrium right ventricle. Right  perihilar and left lower lobe infiltrates are present. Small left-sided pleural effusion. No pneumothorax. Surgical clips upper abdomen. IMPRESSION: 1.  Endotracheal tube 2.2 cm above the carina. 2. Right perihilar and diffuse left lung infiltrates. Small left pleural effusion. 3. Cardiac pacer stable position.  Heart size stable. Electronically Signed   By: Marcello Moores  Register   On: 06/09/2016 07:01   Dg Chest Port 1 View  06/08/2016  CLINICAL DATA:  Endotracheal tube placement. EXAM: PORTABLE CHEST 1 VIEW COMPARISON:  06/08/2016 FINDINGS: New endotracheal tube tip projects 5.7 cm above the carina. Cardiac silhouette is borderline enlarged. No gross mediastinal or hilar masses. Lungs are hyperexpanded with irregularly thickened interstitial markings. Hazy airspace opacification is noted on the left most evident centrally accentuated by rotation to the right. This is similar to the earlier exam. Left-sided pacemaker is stable. IMPRESSION: 1. New endotracheal tip projects 5.7 cm above the carina. 2. No significant change in the appearance of the lungs. Findings may reflect a combination of interstitial and mild hazy airspace edema from congestive heart failure. There is underlying COPD. Electronically Signed   By: Lajean Manes M.D.   On: 06/08/2016 18:39   Dg Chest Port 1 View  06/08/2016  CLINICAL DATA:  Unresponsive with left-sided weakness following pacemaker placement. EXAM: PORTABLE CHEST 1 VIEW COMPARISON:  Radiographs 04/04/2010.  CT 05/07/2016. FINDINGS: 1727 hours. Left subclavian pacemaker leads project over the right atrium and right ventricle. The heart size and mediastinal contours are stable allowing for patient rotation to the right. There is diffuse aortic atherosclerosis. There is vascular congestion with new asymmetric perihilar and lower lobe airspace disease on the left. No pneumothorax or significant pleural effusion identified. The bones appear unchanged. IMPRESSION: 1. The left subclavian  pacemaker leads appear satisfactorily positioned in the right atrium and right ventricle. No pneumothorax. 2. New vascular congestion with asymmetric airspace disease on the left consistent with asymmetric edema or aspiration. Electronically Signed   By: Richardean Sale M.D.   On: 06/08/2016 17:44   2D Echocardiogram  - Left ventricle: The cavity size was mildly dilated. Wall thickness was increased in a pattern of mild LVH. Systolic function was normal. The estimated ejection fraction was in the range of 50% to 55%. The study is not technically sufficient to allow evaluation of LV diastolic function. - Aortic valve: Calcified non coronary cusp. - Mitral valve: There was mild regurgitation. - Left atrium: The atrium was mildly dilated. - Right atrium: The atrium was mildly dilated. - Atrial septum: No defect or patent foramen ovale was identified. - Pericardium, extracardiac: Small localized posterior lateral effusion   PHYSICAL EXAM Frail elderly caucasian lady not in distress.intubated on ventilator. Marland Kitchen  Afebrile. Head is nontraumatic. Neck is supple without bruit.    Cardiac exam no murmur or gallop. Lungs are clear to auscultation. Distal pulses are well felt. Neurological Exam : Awake and alert. Intubated hence higher function testing is limited. Follows commands quite consistently. Eyes are open. Pupils equal reactive. Fundi were not visualized. Blinks to threat bilaterally. Face is symmetric without weakness. Tongue is midline. Motor system exam reveals no upper or lower extremity drift. Mild weakness of left grip and intrinsic hand muscles. Symmetric lower extremity strength. Reflexes 2+ symmetric. Plantars downgoing. Sensation intact bilaterally. Gait was not tested. ASSESSMENT/PLAN Lydia [name] is a 82 y.o. female with history of severe COPD, peripheral vascular disease, recent 4 # weight loss, who presented  To [facility] with AF w/ RVR, was transferred to [facility], who post pacemaker  placement  became unresponsive and developed L hemiparesis. She did not receive IV t-PA .   Stroke:  Non-dominant right brain infarct embolic secondary to atrial fib  Resultant  Mild left sided weakness  CT falx meningioma  CTA head pending  CTA neck  pending  2D Echo  EF 50-55%. No source of embolus   LDL 83  HgbA1c pending   SCDs for VTE prophylaxis     No antithrombotic prior to admission, now on aspirin 300 mg suppository daily  Patient counseled to be compliant with her antithrombotic medications  Ongoing aggressive stroke risk factor management  Therapy recommendations: pending  Disposition:  pending  Atrial Fibrillation w/ RVR, new onset Sick Sinus Syndrome  Home anticoagulation:  No antithrombotic   On IV heparin until 06/08/2016 at 0300  Continue Aspirin now but will need NOAC at discharge    Hypertension  Stable   Permissive hypertension (OK if < 220/120) but gradually normalize in 5-7 days  Long-term BP goal normotensive  Hyperlipidemia  Home meds:  No statin  LDL 83, goal < 70  Add statin -lipitor 10 mg  Continue statin at discharge  Other Stroke Risk Factors  Advanced age  Cigarette smoker, advised to stop smoking  Coronary artery disease in aorta and coronaries, seen on CT  PAD  Mesenteric ischemia and PVD  Other Active Problems  Unexplained weight loss  COPD  Polycythemia   Malnutrition, Body mass index is 14.63 kg/(m^2).   Hospital day # 1  I have personally examined this patient, reviewed notes, independently viewed imaging studies, participated in medical decision making and plan of care. I have made any additions or clarifications directly to the above note. Agree with note above.  She presented with atrial fibrillation with rapid ventricular rate and subsequently was found to have left hemiparesis likely from suspected embolic) infarct. She remains at risk for neurological worsening, recurrent stroke, TIA and needs  ongoing stroke evaluation. Recommend checking CT angiogram of the brain and neck. Cannot obtain MRI due to pacemaker. Continue aspirin for now and when patient is able to swallow consider long-term anticoagulation I had a long discussion at the bedside with the patient, husband and daughter and answered questions. Greater than 50% time during this 35 minute visit was spent on counseling and coordination of care about stroke risk and prevention.  Antony Contras, MD Medical Director [place] Pager: [phone] 06/09/2016 3:44 PM     To contact Stroke Continuity provider, please refer to [email or web address] After hours, contact General Neurology

## 2016-06-09 NOTE — Progress Notes (Signed)
SUBJECTIVE: The patient is doing much better today.  She is awake and alert, moving all of her extremities, asking for the tube to come out  . antiseptic oral rinse  7 mL Mouth Rinse QID  . aspirin  300 mg Rectal Daily  . chlorhexidine gluconate (SAGE KIT)  15 mL Mouth Rinse BID  . ipratropium-albuterol  3 mL Nebulization Q6H  . [START ON 06/10/2016] levofloxacin (LEVAQUIN) IV  500 mg Intravenous Q48H  . pantoprazole sodium  40 mg Per Tube Q24H  . potassium chloride  10 mEq Intravenous Q1 Hr x 4   . amiodarone 30 mg/hr (06/09/16 0000)    OBJECTIVE: Physical Exam: Filed Vitals:   06/09/16 0334 06/09/16 0400 06/09/16 0500 06/09/16 0600  BP:  118/61 131/79 136/78  Pulse:  60 50 70  Temp: 97.4 F (36.3 C)     TempSrc: Oral     Resp:  _0 Height:      Weight:      SpO2: 100% 100% 100% 100%    Intake/Output Summary (Last 24 hours) at 06/09/16 0998 Last data filed at 06/09/16 0600  Gross per 24 hour  Intake 458.43 ml  Output    750 ml  Net -291.57 ml    Telemetry reveals SR, PAF last night   GEN- The patient is intubated, alert and oriented, nods/shakes head yes/no, follows directions, motions to remove ETT   Head- normocephalic, atraumatic Eyes-  Sclera clear, conjunctiva pink Ears- hearing intact Oropharynx- clear Neck- supple, no JVP Lungs- Clear to ausculation bilaterally, normal work of breathing Heart- Regular rate and rhythm, no significant murmurs, no rubs or gallops GI- soft, NT, ND Extremities- no clubbing, cyanosis, or edema Skin- no rash or lesion Psych- euthymic mood, full affect Neuro- moving all extremities this morning  LABS: Basic Metabolic Panel:  Recent Labs  06/08/16 2052 06/09/16 0206  NA 137 136  K 3.7 3.1*  CL 100* 99*  CO2 27 24  GLUCOSE 148* 175*  BUN 22* 24*  CREATININE 1.07* 0.99  CALCIUM 8.6* 8.4*  MG 1.8  --   PHOS 5.5*  --    Liver Function Tests:  Recent Labs  06/08/16 2052  AST 54*  ALT 60*  ALKPHOS 112   BILITOT 1.1  PROT 5.8*  ALBUMIN 2.9*   No results for input(s): LIPASE, AMYLASE in the last 72 hours. CBC:  Recent Labs  06/08/16 2052 06/09/16 0206  WBC 22.8* 15.8*  NEUTROABS 21.4*  --   HGB 13.7 12.2  HCT 43.1 39.5  MCV 85.9 84.9  PLT 237 205   Fasting Lipid Panel:  Recent Labs  06/08/16 2052  CHOL 162  HDL 61  LDLCALC 83  TRIG 92  CHOLHDL 2.7    RADIOLOGY: Ct Head Wo Contrast 06/08/2016  CLINICAL DATA:  Left-sided weakness EXAM: CT HEAD WITHOUT CONTRAST TECHNIQUE: Contiguous axial images were obtained from the base of the skull through the vertex without intravenous contrast. COMPARISON:  None. FINDINGS: Bony calvarium is intact. No gross soft tissue abnormality is noted. Mild atrophic changes are noted as well as scattered areas of chronic white matter ischemic change. A hemangioma is noted along the falx near the vertex scattered basal ganglia calcifications are seen. IMPRESSION: Atrophic and chronic white matter ischemic changes. Hyperdense lesion involving the falx near the vertex likely representing a meningioma. This measures approximately 18 mm in greatest dimension. No acute abnormality is noted. These results were called by telephone at the time of  interpretation on 06/08/2016 at 7:01 pm to Dr. Gifford Shave, who verbally acknowledged these results. Electronically Signed   By: Inez Catalina M.D.   On: 06/08/2016 19:03   Dg Chest Port 1 View 06/08/2016  CLINICAL DATA:  Endotracheal tube placement. EXAM: PORTABLE CHEST 1 VIEW COMPARISON:  06/08/2016 FINDINGS: New endotracheal tube tip projects 5.7 cm above the carina. Cardiac silhouette is borderline enlarged. No gross mediastinal or hilar masses. Lungs are hyperexpanded with irregularly thickened interstitial markings. Hazy airspace opacification is noted on the left most evident centrally accentuated by rotation to the right. This is similar to the earlier exam. Left-sided pacemaker is stable. IMPRESSION: 1. New endotracheal tip  projects 5.7 cm above the carina. 2. No significant change in the appearance of the lungs. Findings may reflect a combination of interstitial and mild hazy airspace edema from congestive heart failure. There is underlying COPD. Electronically Signed   By: Lajean Manes M.D.   On: 06/08/2016 18:39   Dg Chest Port 1 View 06/08/2016  CLINICAL DATA:  Unresponsive with left-sided weakness following pacemaker placement. EXAM: PORTABLE CHEST 1 VIEW COMPARISON:  Radiographs 04/04/2010.  CT 05/07/2016. FINDINGS: 1727 hours. Left subclavian pacemaker leads project over the right atrium and right ventricle. The heart size and mediastinal contours are stable allowing for patient rotation to the right. There is diffuse aortic atherosclerosis. There is vascular congestion with new asymmetric perihilar and lower lobe airspace disease on the left. No pneumothorax or significant pleural effusion identified. The bones appear unchanged. IMPRESSION: 1. The left subclavian pacemaker leads appear satisfactorily positioned in the right atrium and right ventricle. No pneumothorax. 2. New vascular congestion with asymmetric airspace disease on the left consistent with asymmetric edema or aspiration. Electronically Signed   By: Richardean Sale M.D.   On: 06/08/2016 17:44    ASSESSMENT AND PLAN:   1. Tachy-brady syndrome, transferred yesterday from [facility] for PPM      S/p PPM yesterday with Dr. Lovena Le      Intact device check this morning      cxr immediate post procedure and this morning without ptx  2. Intra-post procedure respiratory depression/change in MS     Appreciate neuro     CT negative for acute changes     Suspect suffered bilateral ischemic strokes associated with atrial fibrillation     Started on ASA per neuro     BP peramaters as per neuro (140-160 SBP)     She is much improved this morning, await neurology eval and recommendations today  3. PAFib (new)     On heparin gtt initially, held for her PPM  implant     Pending neuro for a/c recommendations, timing of resumption     continue amio gtt for now, she is currently in SR  4. COPD     intbuated for airway protection in ICU     Management with CCM     ?aspiration vs edema     Patient requests tube out, defer to CCM  5. PVD     Recent hx of 50 lbs weight loss, anorexia, with mesenteric ischemia.subtotal occ of SMA, occ L SFA  6. HTN     Permissive BP as per neuro parameters  7. Hypokalemia     Being replaced  8. Leukocytosis     Will defer to CCM, ?aspiration, on levaquin        Tommye Standard, PA-C 06/09/2016 6:21 AM   EP Attending  Patient seen and examined. Agree with above. She  has had a dramatic recovery from having a dense left hemiparesis to full neuro recovery based on my exam. I note CCM service findings. She may well have had aspiration and agree with treatment of this. Her PPM is working normally and she has gone back to NSR. She will need amiodarone, at least in the short term. Her prognosis is guarded but better than yesterday.  Mikle Bosworth.D.

## 2016-06-09 NOTE — Progress Notes (Signed)
ANTICOAGULATION CONSULT NOTE - Initial Consult  Pharmacy Consult for Heparin Indication: Afib and CVA  Allergies  Allergen Reactions  . Penicillins Hives    Has patient had a PCN reaction causing immediate rash, facial/tongue/throat swelling, SOB or lightheadedness with hypotension: Yes Has patient had a PCN reaction causing severe rash involving mucus membranes or skin necrosis: No Has patient had a PCN reaction that required hospitalization No Has patient had a PCN reaction occurring within the last 10 years. No If all of the above answers are "NO", then may proceed with Cephalosporin use.    Patient Measurements: Height: 5\' 6"  (167.6 cm) Weight: 90 lb 9.7 oz (41.1 kg) IBW/kg (Calculated) : 59.3 Heparin Dosing Weight: 41 kg  Vital Signs: Temp: 98.2 F (36.8 C) (06/13 1553) Temp Source: Oral (06/13 1553) BP: 130/70 mmHg (06/13 1553) Pulse Rate: 102 (06/13 1553)  Labs:  Recent Labs  06/07/16 0553 06/07/16 1502 06/07/16 2355  06/08/16 1826 06/08/16 2052 06/09/16 0206  HGB  --   --   --   < >  --  13.7 12.2  HCT  --   --   --   --   --  43.1 39.5  PLT  --   --   --   --   --  237 205  APTT  --   --   --   --  33  --   --   LABPROT  --   --   --   --  14.8  --   --   INR  --   --   --   --  1.14  --   --   HEPARINUNFRC 0.22* 0.39 0.20*  --   --   --   --   CREATININE  --   --   --   --   --  1.07* 0.99  < > = values in this interval not displayed.  Estimated Creatinine Clearance: 26.5 mL/min (by C-G formula based on Cr of 0.99).   Medical History: Past Medical History  Diagnosis Date  . Essential hypertension   . Osteoporosis   . Lower back pain   . History of stomach ulcers     a. s/p surgery ~ 40 yrs ago.  . Polycythemia     a. phlebotomy.  Marland Kitchen COPD (chronic obstructive pulmonary disease) ([place])   . IDA (iron deficiency anemia)     a. 04/2016 CT Abd: no malignancy.  . Weight loss, unintentional   . Thyroid nodule     a. 04/2016 large left thyroid nodule on  CT chest  - rec dedicated u/s.  . Pulmonary nodule, left     a. 04/2016 CT chest: 22mm LUL nodule - f/u CT in 12 months (h/o smoking).  Marland Kitchen PAD (peripheral artery disease) ([place])     a. 04/2016 CT Abd: occluded prox LSFA.  . Superior mesenteric artery stenosis ([place])     a. 04/2016 CT Abd: atheromatous plaque and mural thrombus in SMA.  . Tobacco abuse   . New onset atrial flutter ([place]) 05/2016  . Tachy-brady syndrome ([place]) 05/2016    a. to [facility] for PPM placement    Assessment: 55 [place] who presented to [place] on 6/9 with new onset Afib with RVR. The patient converted briefly to NSR with medications though this was not sustainable. On 6/12 the patient was transferred to [facility] for PPM placement by Dr. Lovena Le. Post placement, the patient was noted to have left sided weakness  and a right gaze - code stroke was called. Neuro was consulted - the patient is noted to have a R-CVA thought to be d/t Afib. Further imaging this afternoon (CTA head/neck) showed no acute bleeding. Pharmacy has now been consulted to start heparin for Afib.  Given the patient's recent CVA - will NOT bolus and will aim for lower goal of 0.3-0.5. Heparin weight: 41.1 kg, CBC wnl.  Goal of Therapy:  Heparin level 0.3-0.5 units/ml Monitor platelets by anticoagulation protocol: Yes   Plan:  1. Start Heparin at 500 units/hr (5 ml/hr) 2. Will continue to monitor for any signs/symptoms of bleeding and will follow up with heparin level in 8 hours   Alycia Rossetti, PharmD, BCPS Clinical Pharmacist Pager: [phone] 06/09/2016 4:40 PM

## 2016-06-09 NOTE — Progress Notes (Signed)
Tracheal Aspirate obtained/labelled/sent to lab, RN aware. 

## 2016-06-09 NOTE — Care Management Important Message (Signed)
Important Message  Patient Details  Name: Lydia [name] MRN: [id number] Date of Birth: 1927/04/21   Medicare Important Message Given:  Yes    Loann Quill 06/09/2016, 8:36 AM

## 2016-06-09 NOTE — Progress Notes (Signed)
Patient placed back on full vent support due to increased RR and low VT.

## 2016-06-09 NOTE — Progress Notes (Addendum)
PULMONARY / CRITICAL CARE MEDICINE   Name: Lydia [name] MRN: [id number] DOB: [birth year]    ADMISSION DATE:  06/08/2016 CONSULTATION DATE:  6/12  REFERRING MD:  Dr. Lovena Le EP  CHIEF COMPLAINT:  Unresponsive s/p pacemaker placement  HISTORY OF PRESENT ILLNESS:   82 y.o. female with a known history of Hypertension, COPD, iron deficiency anemia and polycythemia. The patient has had a severe weight loss at about 50 pound with uncertain etiology. She was at [facility] having pacemaker placed. PCCM was consulted for unresponsiveness and left sided weakness s/p procedure. Discussion had with husband and son concerning code status, both of whom would like her to be a full code. A code stroke was called at approximately 1800 and she was transferred to [facility] for intubation and further evaluation. She has improved significantly through the night, and is awake and alert, following commands and MAE x 4.   SUBJECTIVE:  Awake and alert orally intubated female. Nods appropriately, lip speaking states she feels better, wants the ETT out.  Did PST this am > tired and fatigued out and placed back on full support. On 50% Fio2 as well.   VITAL SIGNS: BP 94/83 mmHg  Pulse 77  Temp(Src) 97.8 F (36.6 C) (Oral)  Resp 22  Ht 5\' 6"  (1.676 m)  Wt 90 lb 9.7 oz (41.1 kg)  BMI 14.63 kg/m2  SpO2 100%  HEMODYNAMICS:    VENTILATOR SETTINGS: Vent Mode:  [-] PRVC FiO2 (%):  [50 %-100 %] 50 % Set Rate:  [14 bmp-22 bmp] 22 bmp Vt Set:  [470 mL] 470 mL PEEP:  [5 cmH20] 5 cmH20 Plateau Pressure:  [15 cmH20-16 cmH20] 16 cmH20  INTAKE / OUTPUT: I/O last 3 completed shifts: In: 558.4 [I.V.:208.4; IV Piggyback:350] Out: 1150 [Urine:1150]  PHYSICAL EXAMINATION: General:  Very frail cachetic,elderly female, alert and awake, following commands and MAE x4 Neuro: Awake and alert. Spontaneous movement on right and left side, following commands HEENT: PERRL, mucous membranes dry Cardiovascular:  Afib/Aflutter on monitor, No  JVD, No murmurs, discoloration noted to bilateral great toes.  Lungs:  Diminished but coarse breath sounds R>L. Some wheezing at the bases.  Abdomen: flat, + BS,  Musculoskeletal: moving all extremities to command,slightly weaker on right. Skin: Very dry skin, tenting noted to all extremities.   LABS:  BMET  Recent Labs Lab 06/06/16 0419 06/08/16 2052 06/09/16 0206  NA 140 137 136  K 4.1 3.7 3.1*  CL 104 100* 99*  CO2 31 27 24   BUN 20 22* 24*  CREATININE 0.67 1.07* 0.99  GLUCOSE 95 148* 175*    Electrolytes  Recent Labs Lab 06/05/16 1856 06/06/16 0419 06/08/16 2052 06/09/16 0206  CALCIUM  --  8.1* 8.6* 8.4*  MG 1.8  --  1.8  --   PHOS  --   --  5.5*  --     CBC  Recent Labs Lab 06/06/16 0419 06/08/16 0826 06/08/16 2052 06/09/16 0206  WBC 4.9  --  22.8* 15.8*  HGB 14.0 13.9 13.7 12.2  HCT 43.6  --  43.1 39.5  PLT 269  --  237 205    Coag's  Recent Labs Lab 06/05/16 1856 06/08/16 1826  APTT 29 33  INR 0.94 1.14    Sepsis Markers  Recent Labs Lab 06/08/16 2011  LATICACIDVEN 2.1*    ABG  Recent Labs Lab 06/08/16 2031  PHART 7.254*  PCO2ART 63.9*  PO2ART 478.0*    Liver Enzymes  Recent Labs Lab 06/08/16 2052  AST  54*  ALT 60*  ALKPHOS 112  BILITOT 1.1  ALBUMIN 2.9*    Cardiac Enzymes No results for input(s): TROPONINI, PROBNP in the last 168 hours.  Glucose  Recent Labs Lab 06/08/16 1744 06/08/16 2014 06/08/16 2341 06/09/16 0731  GLUCAP 139* 132* 161* 136*    Imaging Ct Head Wo Contrast  06/08/2016  CLINICAL DATA:  Left-sided weakness EXAM: CT HEAD WITHOUT CONTRAST TECHNIQUE: Contiguous axial images were obtained from the base of the skull through the vertex without intravenous contrast. COMPARISON:  None. FINDINGS: Bony calvarium is intact. No gross soft tissue abnormality is noted. Mild atrophic changes are noted as well as scattered areas of chronic white matter ischemic change. A hemangioma is noted along the  falx near the vertex scattered basal ganglia calcifications are seen. IMPRESSION: Atrophic and chronic white matter ischemic changes. Hyperdense lesion involving the falx near the vertex likely representing a meningioma. This measures approximately 18 mm in greatest dimension. No acute abnormality is noted. These results were called by telephone at the time of interpretation on 06/08/2016 at 7:01 pm to Dr. Gifford Shave, who verbally acknowledged these results. Electronically Signed   By: Inez Catalina M.D.   On: 06/08/2016 19:03   Dg Chest Port 1 View  06/09/2016  CLINICAL DATA:  Respiratory failure. EXAM: PORTABLE CHEST 1 VIEW COMPARISON:  06/08/2016. FINDINGS: Endotracheal tube is 2.2 cm above the carina. Cardiac pacer with lead tips in right atrium right ventricle. Right perihilar and left lower lobe infiltrates are present. Small left-sided pleural effusion. No pneumothorax. Surgical clips upper abdomen. IMPRESSION: 1.  Endotracheal tube 2.2 cm above the carina. 2. Right perihilar and diffuse left lung infiltrates. Small left pleural effusion. 3. Cardiac pacer stable position.  Heart size stable. Electronically Signed   By: Marcello Moores  Register   On: 06/09/2016 07:01   Dg Chest Port 1 View  06/08/2016  CLINICAL DATA:  Endotracheal tube placement. EXAM: PORTABLE CHEST 1 VIEW COMPARISON:  06/08/2016 FINDINGS: New endotracheal tube tip projects 5.7 cm above the carina. Cardiac silhouette is borderline enlarged. No gross mediastinal or hilar masses. Lungs are hyperexpanded with irregularly thickened interstitial markings. Hazy airspace opacification is noted on the left most evident centrally accentuated by rotation to the right. This is similar to the earlier exam. Left-sided pacemaker is stable. IMPRESSION: 1. New endotracheal tip projects 5.7 cm above the carina. 2. No significant change in the appearance of the lungs. Findings may reflect a combination of interstitial and mild hazy airspace edema from congestive heart  failure. There is underlying COPD. Electronically Signed   By: Lajean Manes M.D.   On: 06/08/2016 18:39   Dg Chest Port 1 View  06/08/2016  CLINICAL DATA:  Unresponsive with left-sided weakness following pacemaker placement. EXAM: PORTABLE CHEST 1 VIEW COMPARISON:  Radiographs 04/04/2010.  CT 05/07/2016. FINDINGS: 1727 hours. Left subclavian pacemaker leads project over the right atrium and right ventricle. The heart size and mediastinal contours are stable allowing for patient rotation to the right. There is diffuse aortic atherosclerosis. There is vascular congestion with new asymmetric perihilar and lower lobe airspace disease on the left. No pneumothorax or significant pleural effusion identified. The bones appear unchanged. IMPRESSION: 1. The left subclavian pacemaker leads appear satisfactorily positioned in the right atrium and right ventricle. No pneumothorax. 2. New vascular congestion with asymmetric airspace disease on the left consistent with asymmetric edema or aspiration. Electronically Signed   By: Richardean Sale M.D.   On: 06/08/2016 17:44     STUDIES: 6/13:  CXR>>Right perihilar and left lower lobe infiltrates are present.  6/12: CT Brain>> Hyperdense lesion involving the falx near the vertex representing meningioma, 18 mm 6/12: CTA Brain >  CULTURES: 6/12: Sputum Culture>>  ANTIBIOTICS: 6/12: Levaquin>>>  SIGNIFICANT EVENTS: 6/12>> Pace Maker Placement 6/12>> Intubated Post Pace Placement 2/2 ? Stroke vs TIA  LINES/TUBES: 6/12: PIV 6/12: ETT 6/12: Foley  DISCUSSION: Admitted to [place] after becoming unresponsive after pacemaker placement. Code stroke initiated due to patient not moving left side. Patients husband and son notified of change in condition, verified that she is a full code and they would like her intubated. She was intubated on arrival to [facility]. CT/CTA completed. Neurology consulted.CT head shows meningioma, but 6/13  she is awake and alert, moving all  extremities and following commands .  ASSESSMENT / PLAN:  PULMONARY A: Acute Hypoxic Hypercapneic Respiratory Failure 2/2 Unable to protect airway, mile aecopd, asp pna.  COPD/Emphysema > mod-severe based on chest ct scan (family denies h/o copd) Suspected  Aspiration Right perihilar and left lower lobe infiltrates are present. Small left-sided pleural effusion.  P:   Wean as able/ CPAP/ PS. Pt tired out 1 hr into PST and was placed on full support. She was on 50% when I saw her.  Plan for ct scan of head this pm and try weaning this pm.  I will not be in a hurry to extubate today. Maybe in am. D/w pt and husband. Pt is a full code.  Start medrol for mild wheezing noted. Start pulmicort. Cont duoneb.  VAP bundle Continue Levaquin as right and left infiltrates present Duonebs Q6 hours  CARDIOVASCULAR A:  Hypertension Afib/Aflutter P:  Continue Amiodarone gtt while intubated Maintain SBP< 180 Remains in A.Fib/A.Flutter: management per Cards Consider anti-coagulation per neurology > If CT head is (-), I anticipate pt will need to be placed back on heparin drip for afib/flutter.  Recent ECHO (06/06/16) with EF 50-55%, mild mitral regurg  RENAL A:   Possible dehydration - tenting of skin, dry mucous membranes Hypokalemia 6/13- repleated Mag 1.8/ Phos 5.5  P:   IVF for rehydration Electrolyte replacement per protocol as needed  GASTROINTESTINAL A:   Weight Loss - Recent 50 Lb weight loss (Maybe 2/2 COPD cachexia) P:   Insert OGT/dobhoff for TF Monitor for refeeding Consult nutrition for recommendations  HEMATOLOGIC A:   Iron Deficiency Anemia Polycythemia P:  Followed by oncology SCD for DVT prophylaxis Anti-coagulation per neurology > I anticipate restart heparin drip if head scan OK if OK with neuro.   INFECTIOUS A:   Suspected  Aspiration  Sputum Culture sent 6/12 pending GS: RARE GRAM NEGATIVE RODS ;RARE GRAM POSITIVE COCCI IN PAIRS P:   Intubated for  hypoxic resp. Failure Possible aspiration during procedure Levaquin  as above Pro calcitonin to guide antimicrobial treatment Lactic Acid Trend WBC/fever curve  ENDOCRINE A:   R/O DM  P:   Follow BG on AM BMP CBG Q 4 while intubated SSI if needed Send HgBA1C for secondary stroke prevention  NEUROLOGIC A:   Improving Left Sided Weakness Code Stroke 6/12 Incidental finding of Meningioma 6/12 P:   RASS goal: 1 F/U with Neurology recommendations for stroke/ meningioma Follow up imaging per neuro > plan for cranial  ct scan and cta this noon.  Unable to get MRI due to newly inserted pacemaker  FAMILY  - Updates: Husband  And son updated on at bedside  - Inter-disciplinary family meet or Palliative Care meeting due by: 06/15/16  Magdalen Spatz, AGACNP-BC [place] [phone] 06/09/2016 9:39 AM  ATTENDING NOTE / ATTESTATION NOTE :   I have discussed the case with the resident/APP Eric Form.  I agree with the resident/APP's  history, physical examination, assessment, and plans.  I have edited the above note and modified it according to our agreed history, physical examination, assessment and plan.   I have spent 35 minutes of critical care time with this patient today. Briefly, pt intubated for AMS and acute CVA considered. Neuro is seeing pt.  CXR with asp pna. Also likely with mod-severe copd (NEW diagnosis for pt and family).  Doing fair on PST but tired out easily today. Plan to do daily PST, hopefully extubate in 1-2days.   Family :Family updated at length today.    Monica Becton, MD 06/09/2016, 11:42 AM [facility] Pager [phone] After 3 pm or if no answer, call [phone]

## 2016-06-10 ENCOUNTER — Inpatient Hospital Stay (HOSPITAL_COMMUNITY): Payer: Medicare HMO

## 2016-06-10 DIAGNOSIS — I6340 Cerebral infarction due to embolism of unspecified cerebral artery: Secondary | ICD-10-CM

## 2016-06-10 DIAGNOSIS — D72829 Elevated white blood cell count, unspecified: Secondary | ICD-10-CM

## 2016-06-10 DIAGNOSIS — I480 Paroxysmal atrial fibrillation: Secondary | ICD-10-CM

## 2016-06-10 DIAGNOSIS — J449 Chronic obstructive pulmonary disease, unspecified: Secondary | ICD-10-CM

## 2016-06-10 LAB — GLUCOSE, CAPILLARY
GLUCOSE-CAPILLARY: 131 mg/dL — AB (ref 65–99)
GLUCOSE-CAPILLARY: 126 mg/dL — AB (ref 65–99)
GLUCOSE-CAPILLARY: 134 mg/dL — AB (ref 65–99)
GLUCOSE-CAPILLARY: 147 mg/dL — AB (ref 65–99)
GLUCOSE-CAPILLARY: 152 mg/dL — AB (ref 65–99)
Glucose-Capillary: 130 mg/dL — ABNORMAL HIGH (ref 65–99)
Glucose-Capillary: 142 mg/dL — ABNORMAL HIGH (ref 65–99)

## 2016-06-10 LAB — CBC WITH DIFFERENTIAL/PLATELET
Basophils Absolute: 0 10*3/uL (ref 0.0–0.1)
Basophils Relative: 0 %
EOS PCT: 0 %
Eosinophils Absolute: 0 10*3/uL (ref 0.0–0.7)
HCT: 36.3 % (ref 36.0–46.0)
Hemoglobin: 11.8 g/dL — ABNORMAL LOW (ref 12.0–15.0)
LYMPHS ABS: 0.2 10*3/uL — AB (ref 0.7–4.0)
Lymphocytes Relative: 2 %
MCH: 27.1 pg (ref 26.0–34.0)
MCHC: 32.5 g/dL (ref 30.0–36.0)
MCV: 83.4 fL (ref 78.0–100.0)
MONOS PCT: 3 %
Monocytes Absolute: 0.3 10*3/uL (ref 0.1–1.0)
NEUTROS ABS: 10.5 10*3/uL — AB (ref 1.7–7.7)
Neutrophils Relative %: 95 %
Platelets: 190 10*3/uL (ref 150–400)
RBC: 4.35 MIL/uL (ref 3.87–5.11)
WBC: 11 10*3/uL — ABNORMAL HIGH (ref 4.0–10.5)

## 2016-06-10 LAB — BASIC METABOLIC PANEL
ANION GAP: 9 (ref 5–15)
Anion gap: 11 (ref 5–15)
BUN: 24 mg/dL — AB (ref 6–20)
BUN: 29 mg/dL — ABNORMAL HIGH (ref 6–20)
CHLORIDE: 99 mmol/L — AB (ref 101–111)
CHLORIDE: 97 mmol/L — AB (ref 101–111)
CO2: 25 mmol/L (ref 22–32)
CO2: 28 mmol/L (ref 22–32)
Calcium: 8.7 mg/dL — ABNORMAL LOW (ref 8.9–10.3)
Calcium: 9.1 mg/dL (ref 8.9–10.3)
Creatinine, Ser: 1.01 mg/dL — ABNORMAL HIGH (ref 0.44–1.00)
Creatinine, Ser: 1.01 mg/dL — ABNORMAL HIGH (ref 0.44–1.00)
GFR calc Af Amer: 57 mL/min — ABNORMAL LOW (ref >60)
GFR calc non Af Amer: 49 mL/min — ABNORMAL LOW (ref >60)
GFR calc non Af Amer: 49 mL/min — ABNORMAL LOW (ref >60)
GFR, EST AFRICAN AMERICAN: 57 mL/min — AB (ref >60)
GLUCOSE: 114 mg/dL — AB (ref 65–99)
GLUCOSE: 137 mg/dL — AB (ref 65–99)
POTASSIUM: 3.6 mmol/L (ref 3.5–5.1)
Potassium: 2.9 mmol/L — ABNORMAL LOW (ref 3.5–5.1)
Sodium: 135 mmol/L (ref 135–145)
Sodium: 134 mmol/L — ABNORMAL LOW (ref 135–145)

## 2016-06-10 LAB — POCT I-STAT 3, ART BLOOD GAS (G3+)
Acid-Base Excess: 3 mmol/L — ABNORMAL HIGH (ref 0.0–2.0)
Bicarbonate: 25.8 mEq/L — ABNORMAL HIGH (ref 20.0–24.0)
O2 SAT: 98 %
PCO2 ART: 34.1 mmHg — AB (ref 35.0–45.0)
PH ART: 7.487 — AB (ref 7.350–7.450)
TCO2: 27 mmol/L (ref 0–100)
pO2, Arterial: 103 mmHg — ABNORMAL HIGH (ref 80.0–100.0)

## 2016-06-10 LAB — HEMOGLOBIN A1C
HEMOGLOBIN A1C: 5.4 % (ref 4.8–5.6)
MEAN PLASMA GLUCOSE: 108 mg/dL

## 2016-06-10 LAB — HEPARIN LEVEL (UNFRACTIONATED)
HEPARIN UNFRACTIONATED: 0.14 [IU]/mL — AB (ref 0.30–0.70)
Heparin Unfractionated: <0.1 IU/mL — ABNORMAL LOW (ref 0.30–0.70)

## 2016-06-10 LAB — MAGNESIUM
Magnesium: 1.7 mg/dL (ref 1.7–2.4)
Magnesium: 1.7 mg/dL (ref 1.7–2.4)
Magnesium: 1.8 mg/dL (ref 1.7–2.4)

## 2016-06-10 LAB — PHOSPHORUS
PHOSPHORUS: 5.4 mg/dL — AB (ref 2.5–4.6)
PHOSPHORUS: 4.9 mg/dL — AB (ref 2.5–4.6)

## 2016-06-10 LAB — PROCALCITONIN: PROCALCITONIN: 0.23 ng/mL

## 2016-06-10 MED ORDER — AMLODIPINE BESYLATE 5 MG PO TABS
5.0000 mg | ORAL_TABLET | Freq: Every day | ORAL | Status: DC
  Administered 2016-06-10 – 2016-06-16 (×7): 5 mg via ORAL
  Filled 2016-06-10 (×8): qty 1

## 2016-06-10 MED ORDER — POTASSIUM CHLORIDE 20 MEQ/15ML (10%) PO SOLN
40.0000 meq | Freq: Two times a day (BID) | ORAL | Status: DC
  Administered 2016-06-10 – 2016-06-11 (×2): 40 meq
  Filled 2016-06-10 (×3): qty 30

## 2016-06-10 MED ORDER — VITAL HIGH PROTEIN PO LIQD: 1000.0000 mL | ORAL | Status: DC

## 2016-06-10 MED ORDER — HYDRALAZINE HCL 20 MG/ML IJ SOLN: 5.0000 mg | Freq: Four times a day (QID) | INTRAMUSCULAR | Status: DC | PRN

## 2016-06-10 MED ORDER — HEPARIN (PORCINE) IN NACL 100-0.45 UNIT/ML-% IJ SOLN
900.0000 [IU]/h | INTRAMUSCULAR | Status: DC
  Administered 2016-06-11 (×2): 1000 [IU]/h via INTRAVENOUS
  Filled 2016-06-10: qty 250

## 2016-06-10 MED ORDER — PRO-STAT SUGAR FREE PO LIQD
30.0000 mL | Freq: Every day | ORAL | Status: DC
  Administered 2016-06-10 – 2016-06-11 (×2): 30 mL
  Filled 2016-06-10 (×2): qty 30

## 2016-06-10 MED ORDER — VITAL AF 1.2 CAL PO LIQD
1000.0000 mL | ORAL | Status: DC
  Administered 2016-06-10: 1000 mL

## 2016-06-10 MED ORDER — ADULT MULTIVITAMIN LIQUID CH
15.0000 mL | Freq: Every day | ORAL | Status: DC
  Administered 2016-06-10 – 2016-06-11 (×2): 15 mL
  Filled 2016-06-10 (×2): qty 15

## 2016-06-10 MED ORDER — ANTISEPTIC ORAL RINSE SOLUTION (CORINZ)
7.0000 mL | OROMUCOSAL | Status: DC
  Administered 2016-06-10 – 2016-06-11 (×9): 7 mL via OROMUCOSAL

## 2016-06-10 MED ORDER — CHLORHEXIDINE GLUCONATE 0.12% ORAL RINSE (MEDLINE KIT)
15.0000 mL | Freq: Two times a day (BID) | OROMUCOSAL | Status: DC
  Administered 2016-06-11: 15 mL via OROMUCOSAL

## 2016-06-10 MED ORDER — PRO-STAT SUGAR FREE PO LIQD: 30.0000 mL | Freq: Two times a day (BID) | ORAL | Status: DC

## 2016-06-10 NOTE — Progress Notes (Signed)
Cardiac Monitoring Event  Dysrhythmia:  Atrial flutter  Symptoms:  Asymptomatic  Level of Consciousness:  Alert or Arousable  Last set of vital signs taken:  Temp: 98 F (36.7 C)  Pulse Rate: 61  Resp: 19  BP: 127/67 mmHg  SpO2: 96 %  Name of MD Notified:  McQuaid   Time MD Notified:  2110  Comments/Actions Taken:  Notified of change to Afib/Aflutter 110's. Will stay at 30mg  of amiodarone/hr. Also notified of increased PVCs when converted back to NSR. MD ordered BMET with [place].

## 2016-06-10 NOTE — Progress Notes (Signed)
Pharmacy called to increase heparin dosage. Notified of bleeding at alternate IV site and necessary removal. Bleeding now under control. Heparin never discontinued. To be increased. Will continue to monitor patient for signs of bleeding.

## 2016-06-10 NOTE — Progress Notes (Signed)
Initial Nutrition Assessment  DOCUMENTATION CODES:   Severe malnutrition in context of chronic illness  INTERVENTION:   Initiate Vital AF 1.2 @ 15 ml/hr via OG tube and increase by 10 ml every 8 hours to goal rate of 35 ml/hr.  30 ml Prostat daily  Tube feeding regimen provides 1108 kcal (98% of needs), 88 grams of protein, and 681 ml of H2O.   Monitor magnesium, potassium, and phosphorus daily for at least 3 days, MD to replete as needed, as pt is at risk for refeeding syndrome given severe malnutrition.  NUTRITION DIAGNOSIS:   Malnutrition (Severe) related to chronic illness (COPD) as evidenced by severe depletion of body fat, severe depletion of muscle mass.  GOAL:   Patient will meet greater than or equal to 90% of their needs  MONITOR:   Vent status, TF tolerance, I & O's, Weight trends  REASON FOR ASSESSMENT:   Consult Enteral/tube feeding initiation and management  ASSESSMENT:   Pt with hx of severe COPD, PVD and "recent" 50 lb weight loss tx from [facility] where she presented with afib with RVR. Pt had pacemaker placed and had neuro changes, code stroke called pt intubated, CT showed severe small vessel dz without acute findings.    Patient is currently intubated on ventilator support MV: 6.7 L/min Temp (24hrs), Avg:98.3 F (36.8 C), Min:97.8 F (36.6 C), Max:99 F (37.2 C)  Medications reviewed and include: solumedrol Labs reviewed CBG's: 126-142 OG tube Nutrition-Focused physical exam completed. Findings are severe fat depletion, severe muscle depletion, and mild edema in her right upper extremity.    Pt awake and alert and able to participate in the exam. Shook head yes to weight loss but stable more recently. Appetite is just so/so.  Reviewed recent RD assessment.    Diet Order:    NPO  Skin:  Wound (see comment) (stage I sacrum)  Last BM:  unknown  Height:   Ht Readings from Last 1 Encounters:  06/08/16 5\' 6"  (1.676 m)    Weight:   Wt Readings  from Last 1 Encounters:  06/10/16 89 lb 11.6 oz (40.7 kg)    Ideal Body Weight:  59 kg  BMI:  Body mass index is 14.49 kg/(m^2).  Estimated Nutritional Needs:   Kcal:  1130  Protein:  70-85  Fluid:  >1.5 L/day  EDUCATION NEEDS:   No education needs identified at this time  [employer], [place], [place] Pager [phone] After Hours Pager

## 2016-06-10 NOTE — Progress Notes (Signed)
STROKE TEAM PROGRESS NOTE   HISTORY OF PRESENT ILLNESS (per record) Lydia [name] is a 82 y.o. female with history of severe COPD, peripheral vascular disease, recent history of 50 pound weight loss, initially transferred from [facility] where she presented with atrial fibrillation with RVR. Patient was getting a pacemaker procedure. Last known normal around 4 PM 06/08/2106 (LKW). During the end of the procedure patient was making gurgling noises and was not moving her left side. Patient was treated with IV heparin until 3 AM last night. Code stroke was activated around 6 PM. Patient was transferred to medical ICU for intubation to protect her airway. CT scan was done 6:50 PM which was reviewed and shows signs of severe small vessel disease without any acute findings. Patient was not administered IV t-PA.    SUBJECTIVE (INTERVAL HISTORY)  She seems to be neurologically stable moving her left side well now. She is on amiodarone  drip for rate control. CT angiogram of the brain and neck shows no large vessel stenosis or occlusion.Ct head shows yet no acute infarct..iv Heparin drip started for stroke prevention.She did not tolerate weaning off the ventilator and developed tachycardia  OBJECTIVE Temp:  [97.8 F (36.6 C)-99 F (37.2 C)] 98 F (36.7 C) (06/14 1147) Pulse Rate:  [48-102] 91 (06/14 1147) Cardiac Rhythm:  [-] Atrial fibrillation (06/14 0800) Resp:  [15-30] 16 (06/14 1147) BP: (113-153)/(57-97) 134/92 mmHg (06/14 1147) SpO2:  [99 %-100 %] 100 % (06/14 1147) FiO2 (%):  [30 %-40 %] 30 % (06/14 1134) Weight:  [89 lb 11.6 oz (40.7 kg)] 89 lb 11.6 oz (40.7 kg) (06/14 0600)  CBC:   Recent Labs Lab 06/08/16 2052 06/09/16 0206 06/10/16 0205  WBC 22.8* 15.8* 11.0*  NEUTROABS 21.4*  --  10.5*  HGB 13.7 12.2 11.8*  HCT 43.1 39.5 36.3  MCV 85.9 84.9 83.4  PLT 237 205 [id number]    Basic Metabolic Panel:   Recent Labs Lab 06/08/16 2052 06/09/16 0206 06/10/16 0205  06/10/16 1109  NA 137 136 135  --   K 3.7 3.1* 3.6  --   CL 100* 99* 99*  --   CO2 27 24 25   --   GLUCOSE 148* 175* 114*  --   BUN 22* 24* 24*  --   CREATININE 1.07* 0.99 1.01*  --   CALCIUM 8.6* 8.4* 8.7*  --   MG 1.8  --   --  1.7  PHOS 5.5*  --   --  5.4*    Lipid Panel:     Component Value Date/Time   CHOL 162 06/08/2016 2052   CHOL 175 11/08/2014 1341   TRIG 92 06/08/2016 2052   TRIG 109 11/08/2014 1341   HDL 61 06/08/2016 2052   HDL 68* 11/08/2014 1341   CHOLHDL 2.7 06/08/2016 2052   VLDL 18 06/08/2016 2052   VLDL 22 11/08/2014 1341   LDLCALC 83 06/08/2016 2052   LDLCALC 85 11/08/2014 1341   HgbA1c:  Lab Results  Component Value Date   HGBA1C 5.4 06/08/2016   Urine Drug Screen: No results found for: LABOPIA, COCAINSCRNUR, LABBENZ, AMPHETMU, THCU, LABBARB    IMAGING  Ct Angio Head W/cm &/or Wo Cm  06/09/2016  CLINICAL DATA:  82 year old female status post pacemaker placement yesterday with acute onset left side weakness. Initial encounter. EXAM: CT ANGIOGRAPHY HEAD AND NECK TECHNIQUE: Multidetector CT imaging of the head and neck was performed using the standard protocol during bolus administration of intravenous contrast. Multiplanar CT  image reconstructions and MIPs were obtained to evaluate the vascular anatomy. Carotid stenosis measurements (when applicable) are obtained utilizing NASCET criteria, using the distal internal carotid diameter as the denominator. CONTRAST:  50 mL Isovue 370 COMPARISON:  Head CT without contrast 06/08/2016. Chest CT 05/07/2016. FINDINGS: CT HEAD Brain: Stable cerebral volume. No acute intracranial hemorrhage identified. No acute or evolving cortically based infarct identified. Patchy and confluent bilateral white matter hypodensity appears stable and is nonspecific. Deep gray matter nuclei appear stable and within normal limits. Posterior fossa gray-white matter differentiation appears stable. Calvarium and skull base: Stable and intact.  Paranasal sinuses: Stable and well pneumatized visualized paranasal sinuses and mastoids. Orbits: No acute orbit or scalp soft tissue findings. CTA NECK Skeleton: Degenerative changes in the lower cervical spine including multilevel mild spondylolisthesis. Osteopenia. No acute osseous abnormality identified. Other neck: Intubated. Endotracheal tube tip terminates above the carina. Small volume retained secretions in the trachea. Mild fluid in the pharynx. Layering left pleural effusion, likely moderate in size as fluid tracks to the left apex. Centrilobular emphysema in the visible lung parenchyma. Several small areas of patchy ground-glass opacity in the left upper lobe. There is a partially visible more solid-appearing 8 mm lung nodule on series 501, image 1 which is new since May. No superior mediastinal lymphadenopathy. Left thyroid goiter with dystrophic calcification and partial extension into the superior mediastinum. Negative superior parapharyngeal and retropharyngeal spaces. Sublingual space, submandibular glands and parotid glands are within normal limits. Cachexia. No cervical lymphadenopathy identified. Aortic arch: 3 vessel arch configuration with extensive calcified arch atherosclerosis. Right carotid system: No brachiocephalic artery or right CCA origin stenosis despite calcified plaque. No right CCA stenosis despite calcified plaque. Confluent calcified plaque at the right carotid bifurcation affecting the right ICA origin and to a lesser extent the bulb. Subsequent stenosis is less than 50 % with respect to the distal vessel. Negative cervical right ICA otherwise aside from tortuosity. Left carotid system: No left CCA stenosis despite soft and calcified plaque. Confluent calcified plaque at the posterior left ICA origin. Subsequent stenosis is less than 50 % with respect to the distal vessel. Negative cervical left ICA otherwise. Vertebral arteries: No proximal left subclavian artery stenosis  despite soft and calcified plaque. Left vertebral artery origin without stenosis despite soft plaque. Calcified plaque in the left V1, V2, and V3 segments with no hemodynamically significant stenosis. No proximal right subclavian artery stenosis despite calcified plaque. That plaque does affect the right vertebral artery origin with mild stenosis. There is calcified plaque in the right V1 segment with additional mild stenosis. Mild right V3 segment calcified plaque. No right vertebral artery stenosis to the skullbase. CTA HEAD Posterior circulation: Extensive calcified plaque in the bilateral vertebral artery V4 segments. Mild to moderate bilateral distal vertebral artery stenosis but preserved patency of the vertebrobasilar junction. No basilar artery stenosis. SCA and PCA origins are normal. Posterior communicating arteries are diminutive or absent. The left PCA P2 segment is moderately irregular, but there is preserved distal flow (series 5015, image 20). The right PCA branches are mildly irregular. Anterior circulation: Both ICA siphons are patent with extensive bilateral calcified atherosclerosis. Up to mild left siphon stenosis occurs in the proximal supraclinoid segment. On the right there is mild siphon stenosis at the proximal cavernous segment, it and the proximal supraclinoid segment. Both carotid termini remain patent. Normal MCA and ACA origins. Diminutive or absent anterior communicating artery. Bilateral ACA branches are within normal limits. Left MCA M1 segment, bifurcation, and  left MCA branches are patent. There is mild irregularity at the left bifurcation and in the left MCA branches. Right MCA M1 segment, bifurcation, and right MCA branches remain patent. There is mild irregularity at the bifurcation and in the right MCA branches. Venous sinuses: Patent. Anatomic variants: None. Delayed phase: Round homogeneously enhancing 16 mm diameter lesion of the interhemispheric fissure (series 601, image  25) corresponds to the hyperdense lesion described yesterday and appears stable. Minimal to mild associated mass effect with no definite cerebral edema. No other abnormal intracranial enhancement. IMPRESSION: 1. Negative for emergent large vessel occlusion. 2. Extensive atherosclerosis, but no anterior circulation hemodynamically significant stenosis. There are mild bilateral ICA siphon stenoses. 3. Up to moderate stenoses of the bilateral vertebral arteries and left PCA P2 segment in the posterior circulation. 4. No acute or evolving cortically based infarct identified. Advanced white matter signal changes compatible with chronic small vessel disease. 5. Intubated. Small volume retained secretions in the trachea. Partially visible patchy and nodular left upper lung opacity is new since 05/07/2016 compatible with infectious or inflammatory process there is an associated moderate size layering left pleural effusion. Underlying emphysema. 6. Parafalcine 16 mm meningioma at the level of the cingulate gyri with no significant mass effect and no definite cerebral edema. Electronically Signed   By: Genevie Ann M.D.   On: 06/09/2016 15:54   Ct Head Wo Contrast  06/08/2016  CLINICAL DATA:  Left-sided weakness EXAM: CT HEAD WITHOUT CONTRAST TECHNIQUE: Contiguous axial images were obtained from the base of the skull through the vertex without intravenous contrast. COMPARISON:  None. FINDINGS: Bony calvarium is intact. No gross soft tissue abnormality is noted. Mild atrophic changes are noted as well as scattered areas of chronic white matter ischemic change. A hemangioma is noted along the falx near the vertex scattered basal ganglia calcifications are seen. IMPRESSION: Atrophic and chronic white matter ischemic changes. Hyperdense lesion involving the falx near the vertex likely representing a meningioma. This measures approximately 18 mm in greatest dimension. No acute abnormality is noted. These results were called by  telephone at the time of interpretation on 06/08/2016 at 7:01 pm to Dr. Gifford Shave, who verbally acknowledged these results. Electronically Signed   By: Inez Catalina M.D.   On: 06/08/2016 19:03   Ct Angio Neck W/cm &/or Wo/cm  06/09/2016  CLINICAL DATA:  82 year old female status post pacemaker placement yesterday with acute onset left side weakness. Initial encounter. EXAM: CT ANGIOGRAPHY HEAD AND NECK TECHNIQUE: Multidetector CT imaging of the head and neck was performed using the standard protocol during bolus administration of intravenous contrast. Multiplanar CT image reconstructions and MIPs were obtained to evaluate the vascular anatomy. Carotid stenosis measurements (when applicable) are obtained utilizing NASCET criteria, using the distal internal carotid diameter as the denominator. CONTRAST:  50 mL Isovue 370 COMPARISON:  Head CT without contrast 06/08/2016. Chest CT 05/07/2016. FINDINGS: CT HEAD Brain: Stable cerebral volume. No acute intracranial hemorrhage identified. No acute or evolving cortically based infarct identified. Patchy and confluent bilateral white matter hypodensity appears stable and is nonspecific. Deep gray matter nuclei appear stable and within normal limits. Posterior fossa gray-white matter differentiation appears stable. Calvarium and skull base: Stable and intact. Paranasal sinuses: Stable and well pneumatized visualized paranasal sinuses and mastoids. Orbits: No acute orbit or scalp soft tissue findings. CTA NECK Skeleton: Degenerative changes in the lower cervical spine including multilevel mild spondylolisthesis. Osteopenia. No acute osseous abnormality identified. Other neck: Intubated. Endotracheal tube tip terminates above the carina. Small  volume retained secretions in the trachea. Mild fluid in the pharynx. Layering left pleural effusion, likely moderate in size as fluid tracks to the left apex. Centrilobular emphysema in the visible lung parenchyma. Several small areas of  patchy ground-glass opacity in the left upper lobe. There is a partially visible more solid-appearing 8 mm lung nodule on series 501, image 1 which is new since May. No superior mediastinal lymphadenopathy. Left thyroid goiter with dystrophic calcification and partial extension into the superior mediastinum. Negative superior parapharyngeal and retropharyngeal spaces. Sublingual space, submandibular glands and parotid glands are within normal limits. Cachexia. No cervical lymphadenopathy identified. Aortic arch: 3 vessel arch configuration with extensive calcified arch atherosclerosis. Right carotid system: No brachiocephalic artery or right CCA origin stenosis despite calcified plaque. No right CCA stenosis despite calcified plaque. Confluent calcified plaque at the right carotid bifurcation affecting the right ICA origin and to a lesser extent the bulb. Subsequent stenosis is less than 50 % with respect to the distal vessel. Negative cervical right ICA otherwise aside from tortuosity. Left carotid system: No left CCA stenosis despite soft and calcified plaque. Confluent calcified plaque at the posterior left ICA origin. Subsequent stenosis is less than 50 % with respect to the distal vessel. Negative cervical left ICA otherwise. Vertebral arteries: No proximal left subclavian artery stenosis despite soft and calcified plaque. Left vertebral artery origin without stenosis despite soft plaque. Calcified plaque in the left V1, V2, and V3 segments with no hemodynamically significant stenosis. No proximal right subclavian artery stenosis despite calcified plaque. That plaque does affect the right vertebral artery origin with mild stenosis. There is calcified plaque in the right V1 segment with additional mild stenosis. Mild right V3 segment calcified plaque. No right vertebral artery stenosis to the skullbase. CTA HEAD Posterior circulation: Extensive calcified plaque in the bilateral vertebral artery V4 segments. Mild  to moderate bilateral distal vertebral artery stenosis but preserved patency of the vertebrobasilar junction. No basilar artery stenosis. SCA and PCA origins are normal. Posterior communicating arteries are diminutive or absent. The left PCA P2 segment is moderately irregular, but there is preserved distal flow (series 5015, image 20). The right PCA branches are mildly irregular. Anterior circulation: Both ICA siphons are patent with extensive bilateral calcified atherosclerosis. Up to mild left siphon stenosis occurs in the proximal supraclinoid segment. On the right there is mild siphon stenosis at the proximal cavernous segment, it and the proximal supraclinoid segment. Both carotid termini remain patent. Normal MCA and ACA origins. Diminutive or absent anterior communicating artery. Bilateral ACA branches are within normal limits. Left MCA M1 segment, bifurcation, and left MCA branches are patent. There is mild irregularity at the left bifurcation and in the left MCA branches. Right MCA M1 segment, bifurcation, and right MCA branches remain patent. There is mild irregularity at the bifurcation and in the right MCA branches. Venous sinuses: Patent. Anatomic variants: None. Delayed phase: Round homogeneously enhancing 16 mm diameter lesion of the interhemispheric fissure (series 601, image 25) corresponds to the hyperdense lesion described yesterday and appears stable. Minimal to mild associated mass effect with no definite cerebral edema. No other abnormal intracranial enhancement. IMPRESSION: 1. Negative for emergent large vessel occlusion. 2. Extensive atherosclerosis, but no anterior circulation hemodynamically significant stenosis. There are mild bilateral ICA siphon stenoses. 3. Up to moderate stenoses of the bilateral vertebral arteries and left PCA P2 segment in the posterior circulation. 4. No acute or evolving cortically based infarct identified. Advanced white matter signal changes compatible with  chronic small vessel disease. 5. Intubated. Small volume retained secretions in the trachea. Partially visible patchy and nodular left upper lung opacity is new since 05/07/2016 compatible with infectious or inflammatory process there is an associated moderate size layering left pleural effusion. Underlying emphysema. 6. Parafalcine 16 mm meningioma at the level of the cingulate gyri with no significant mass effect and no definite cerebral edema. Electronically Signed   By: Genevie Ann M.D.   On: 06/09/2016 15:54   Dg Chest Port 1 View  06/10/2016  CLINICAL DATA:  Hypoxia EXAM: PORTABLE CHEST 1 VIEW COMPARISON:  June 09, 2016 FINDINGS: Endotracheal tube tip is 3.2 cm above the carina. Nasogastric tube tip and side port are in the stomach. Pacemaker leads are attached to the right atrium and right ventricle. There is no demonstrable pneumothorax. There has been interval clearing of infiltrate from the left base with only mild left base atelectasis remaining. There is mild perihilar interstitial edema, slightly less compared to 1 day prior. No new opacity. Lungs are hyperexpanded. Heart size and pulmonary vascularity are within normal limits. No adenopathy evident. IMPRESSION: Evidence of clearing of infiltrate from left base as well as partial but incomplete clearing of perihilar interstitial edema. There is residual atelectasis in the left base and mild perihilar interstitial edema currently. No new opacity. No change in cardiac silhouette. Tube positions as described without pneumothorax. Electronically Signed   By: Lowella Grip III M.D.   On: 06/10/2016 07:04   Dg Chest Port 1 View  06/09/2016  CLINICAL DATA:  Respiratory failure. EXAM: PORTABLE CHEST 1 VIEW COMPARISON:  06/08/2016. FINDINGS: Endotracheal tube is 2.2 cm above the carina. Cardiac pacer with lead tips in right atrium right ventricle. Right perihilar and left lower lobe infiltrates are present. Small left-sided pleural effusion. No  pneumothorax. Surgical clips upper abdomen. IMPRESSION: 1.  Endotracheal tube 2.2 cm above the carina. 2. Right perihilar and diffuse left lung infiltrates. Small left pleural effusion. 3. Cardiac pacer stable position.  Heart size stable. Electronically Signed   By: Marcello Moores  Register   On: 06/09/2016 07:01   Dg Chest Port 1 View  06/08/2016  CLINICAL DATA:  Endotracheal tube placement. EXAM: PORTABLE CHEST 1 VIEW COMPARISON:  06/08/2016 FINDINGS: New endotracheal tube tip projects 5.7 cm above the carina. Cardiac silhouette is borderline enlarged. No gross mediastinal or hilar masses. Lungs are hyperexpanded with irregularly thickened interstitial markings. Hazy airspace opacification is noted on the left most evident centrally accentuated by rotation to the right. This is similar to the earlier exam. Left-sided pacemaker is stable. IMPRESSION: 1. New endotracheal tip projects 5.7 cm above the carina. 2. No significant change in the appearance of the lungs. Findings may reflect a combination of interstitial and mild hazy airspace edema from congestive heart failure. There is underlying COPD. Electronically Signed   By: Lajean Manes M.D.   On: 06/08/2016 18:39   Dg Chest Port 1 View  06/08/2016  CLINICAL DATA:  Unresponsive with left-sided weakness following pacemaker placement. EXAM: PORTABLE CHEST 1 VIEW COMPARISON:  Radiographs 04/04/2010.  CT 05/07/2016. FINDINGS: 1727 hours. Left subclavian pacemaker leads project over the right atrium and right ventricle. The heart size and mediastinal contours are stable allowing for patient rotation to the right. There is diffuse aortic atherosclerosis. There is vascular congestion with new asymmetric perihilar and lower lobe airspace disease on the left. No pneumothorax or significant pleural effusion identified. The bones appear unchanged. IMPRESSION: 1. The left subclavian pacemaker leads appear satisfactorily positioned in  the right atrium and right ventricle. No  pneumothorax. 2. New vascular congestion with asymmetric airspace disease on the left consistent with asymmetric edema or aspiration. Electronically Signed   By: Richardean Sale M.D.   On: 06/08/2016 17:44   Dg Abd Portable 1v  06/09/2016  CLINICAL DATA:  Nasogastric tube placement. EXAM: PORTABLE ABDOMEN - 1 VIEW COMPARISON:  CT 05/07/16 FINDINGS: Nasogastric tube tip is at the level of the stomach. Epigastric sutures and clips related to previous stomach surgery. Contrast in the urinary collecting system related to recent CTA. Lung bases are grossly clear. Nonobstructive bowel gas pattern. Lumbar scoliosis. IMPRESSION: Nasogastric tube with tip at the stomach. Electronically Signed   By: Monte Fantasia M.D.   On: 06/09/2016 18:54   2D Echocardiogram  - Left ventricle: The cavity size was mildly dilated. Wall thickness was increased in a pattern of mild LVH. Systolic function was normal. The estimated ejection fraction was in the range of 50% to 55%. The study is not technically sufficient to allow evaluation of LV diastolic function. - Aortic valve: Calcified non coronary cusp. - Mitral valve: There was mild regurgitation. - Left atrium: The atrium was mildly dilated. - Right atrium: The atrium was mildly dilated. - Atrial septum: No defect or patent foramen ovale was identified. - Pericardium, extracardiac: Small localized posterior lateral effusion CT angio brain and neck 06/09/2016 ; 1. Negative for emergent large vessel occlusion. 2. Extensive atherosclerosis, but no anterior circulation hemodynamically significant stenosis. There are mild bilateral ICA siphon stenoses. 3. Up to moderate stenoses of the bilateral vertebral arteries and left PCA P2 segment in the posterior circulation  PHYSICAL EXAM Frail elderly caucasian lady not in distress.intubated on ventilator. . Afebrile. Head is nontraumatic. Neck is supple without bruit.    Cardiac exam no murmur or gallop. Lungs are clear to  auscultation. Distal pulses are well felt. Neurological Exam : Awake and alert. Intubated hence higher function testing is limited. Follows commands quite consistently. Eyes are open. Pupils equal reactive. Fundi were not visualized. Blinks to threat bilaterally. Face is symmetric without weakness. Tongue is midline. Motor system exam reveals no upper or lower extremity drift. Mild weakness of left grip and intrinsic hand muscles. Symmetric lower extremity strength. Reflexes 2+ symmetric. Plantars downgoing. Sensation intact bilaterally. Gait was not tested. ASSESSMENT/PLAN Lydia [name] is a 82 y.o. female with history of severe COPD, peripheral vascular disease, recent 83 # weight loss, who presented  To [place] with AF w/ RVR, was transferred to [facility], who post pacemaker placement  became unresponsive and developed L hemiparesis. She did not receive IV t-PA .   Stroke:  Non-dominant right brain infarct embolic secondary to atrial fib  Resultant  Mild left sided weakness  CT falx meningioma  CTA head pending  CTA neck  pending  2D Echo  EF 50-55%. No source of embolus   LDL 83  HgbA1c pending   SCDs for VTE prophylaxis    No antithrombotic prior to admission, now on aspirin 300 mg suppository daily  Patient counseled to be compliant with her antithrombotic medications  Ongoing aggressive stroke risk factor management  Therapy recommendations: pending  Disposition:  pending  Atrial Fibrillation w/ RVR, new onset Sick Sinus Syndrome  Home anticoagulation:  No antithrombotic   On IV heparin until 06/08/2016 at 0300  Continue Aspirin now but will need NOAC at discharge    Hypertension  Stable   Permissive hypertension (OK if < 220/120) but gradually normalize in 5-7  days  Long-term BP goal normotensive  Hyperlipidemia  Home meds:  No statin  LDL 83, goal < 70  Add statin -lipitor 10 mg  Continue statin at discharge  Other Stroke Risk Factors  Advanced  age  Cigarette smoker, advised to stop smoking  Coronary artery disease in aorta and coronaries, seen on CT  PAD  Mesenteric ischemia and PVD  Other Active Problems  Unexplained weight loss  COPD  Polycythemia   Malnutrition, Body mass index is 14.49 kg/(m^2).   Hospital day # 2  I have personally examined this patient, reviewed notes, independently viewed imaging studies, participated in medical decision making and plan of care. I have made any additions or clarifications directly to the above note. Agree with note above.  She presented with atrial fibrillation with rapid ventricular rate and subsequently was found to have left hemiparesis likely from suspected small embolic)infarct not visualized on Ct and unable to obtain MRI due to pacer.. She remains at risk for neurological worsening, recurrent stroke, TIA and needs ongoing stroke evaluation.   CT angiogram of the brain and neck shows no large vessel stenosis or occlusion..  Continue iv heparin for now and when patient is able to swallow consider long-term anticoagulation I had a long discussion at the bedside with the patient, . Greater than 50% time during this 35 minute  visit was spent on counseling and coordination of care about stroke risk and prevention.  Antony Contras, MD Medical Director [facility] Pager: [phone] 06/10/2016 12:51 PM     To contact Stroke Continuity provider, please refer to [email or web address] After hours, contact General Neurology

## 2016-06-10 NOTE — Progress Notes (Signed)
eLink Physician-Brief Progress Note Patient Name: AILANIE HOWMAN DOB: Apr 24, 1927 MRN: [id number]   Date of Service  06/10/2016  HPI/Events of Note  K 2.9  eICU Interventions  Replace K via tube 58mEq bid x3 doses     Intervention Category Intermediate Interventions: Electrolyte abnormality - evaluation and management  Simonne Maffucci 06/10/2016, 10:50 PM

## 2016-06-10 NOTE — Progress Notes (Addendum)
[place] for Heparin Indication: Afib and CVA  Allergies  Allergen Reactions  . Penicillins Hives    Has patient had a PCN reaction causing immediate rash, facial/tongue/throat swelling, SOB or lightheadedness with hypotension: Yes Has patient had a PCN reaction causing severe rash involving mucus membranes or skin necrosis: No Has patient had a PCN reaction that required hospitalization No Has patient had a PCN reaction occurring within the last 10 years. No If all of the above answers are "NO", then may proceed with Cephalosporin use.    Patient Measurements: Height: 5\' 6"  (167.6 cm) Weight: 89 lb 11.6 oz (40.7 kg) IBW/kg (Calculated) : 59.3 Heparin Dosing Weight: 41 kg  Vital Signs: Temp: 97.6 F (36.4 C) (06/14 1540) Temp Source: Oral (06/14 1540) BP: 127/67 mmHg (06/14 2002) Pulse Rate: 61 (06/14 2002)  Labs:  Recent Labs  06/08/16 1826 06/08/16 2052 06/09/16 0206 06/10/16 0205 06/10/16 1108 06/10/16 2109  HGB  --  13.7 12.2 11.8*  --   --   HCT  --  43.1 39.5 36.3  --   --   PLT  --  237 205 190  --   --   APTT 33  --   --   --   --   --   LABPROT 14.8  --   --   --   --   --   INR 1.14  --   --   --   --   --   HEPARINUNFRC  --   --   --  <0.10* <0.10* 0.14*  CREATININE  --  1.07* 0.99 1.01*  --   --     Estimated Creatinine Clearance: 25.7 mL/min (by C-G formula based on Cr of 1.01).   Assessment: 61 YOF who presented to [place] on 6/9 with new onset Afib with RVR. The patient converted briefly to NSR with medications though this was not sustainable. On 6/12 the patient was transferred to [facility] for PPM placement by Dr. Lovena Le. Post placement, the patient was noted to have left sided weakness and a right gaze - code stroke was called. Neuro was consulted - the patient is noted to have a R-CVA thought to be d/t Afib. Repeat CT head/neck showed small R brain infarct.  -heparin increased to 850 units/hr but remains below  goal -RN reports some recent bleeding from an IV site (not where heparin is infusing) but this has resolved   Goal of Therapy:  Heparin level 0.3-0.5 units/ml Monitor platelets by anticoagulation protocol: Yes    Plan:  -Increase heparin to 1000 units/hr -8 hour heparin level -Daily HL, CBC  Hildred Laser, Pharm D 06/10/2016 9:46 PM    ADDENDUM:  RN reported bleeding from IV site that heparin is infusing. Dr. Lake Bells stated to hold heparin and then restart in 4 hrs at same rate. Adjusted times for levels.  Elenor Quinones, PharmD, BCPS Clinical Pharmacist Pager [phone] 06/10/2016 10:29 PM

## 2016-06-10 NOTE — Progress Notes (Signed)
PULMONARY / CRITICAL CARE MEDICINE   Name: Lydia [name] MRN: [id number] DOB: 1927-03-09    ADMISSION DATE:  06/08/2016 CONSULTATION DATE:  6/12  REFERRING MD:  Dr. Lovena Le EP  CHIEF COMPLAINT:  Unresponsive s/p pacemaker placement  HISTORY OF PRESENT ILLNESS:   82yo female with HTN, COPD, tachy-brady syndrome having a pacemaker placed 6/12 when she became unresponsive with L sided weaness.  Code stroke called and pt intubated and tx ICU.  Mental status improving.   SUBJECTIVE:  Not tol PS wean -- Vt 150, RR 30's.  Awake, alert, appropriate. Denies SOB.  Tachycardic  VITAL SIGNS: BP 135/83 mmHg  Pulse 90  Temp(Src) 97.9 F (36.6 C) (Oral)  Resp 22  Ht 5\' 6"  (1.676 m)  Wt 40.7 kg (89 lb 11.6 oz)  BMI 14.49 kg/m2  SpO2 99%  HEMODYNAMICS:    VENTILATOR SETTINGS: Vent Mode:  [-] CPAP;PSV FiO2 (%):  [30 %-50 %] 30 % Set Rate:  [20 bmp-22 bmp] 22 bmp Vt Set:  [470 mL] 470 mL PEEP:  [5 cmH20] 5 cmH20 Pressure Support:  [5 cmH20] 5 cmH20 Plateau Pressure:  [6 cmH20-16 cmH20] 16 cmH20  INTAKE / OUTPUT: I/O last 3 completed shifts: In: 1202.6 [I.V.:652.6; IV Piggyback:550] Out: [id number] [Urine:1385]  PHYSICAL EXAMINATION: General:  Frail, cachetic, elderly female, NAD  Neuro: Awake and alert. Follows commands, nods appropriately, MAE, mild R sided weakness  HEENT: PERRL, mucous membranes dry Cardiovascular:  Afib/Aflutter with RVR, No JVD, No murmurs, discoloration noted to bilateral great toes.  Lungs:  Diminished throughout, few scattered wheezes  Abdomen: flat, + BS,  Musculoskeletal: moving all extremities to command,slightly weaker on right.  LABS:  BMET  Recent Labs Lab 06/08/16 2052 06/09/16 0206 06/10/16 0205  NA 137 136 135  K 3.7 3.1* 3.6  CL 100* 99* 99*  CO2 27 24 25   BUN 22* 24* 24*  CREATININE 1.07* 0.99 1.01*  GLUCOSE 148* 175* 114*    Electrolytes  Recent Labs Lab 06/05/16 1856  06/08/16 2052 06/09/16 0206 06/10/16 0205  CALCIUM  --    < > 8.6* 8.4* 8.7*  MG 1.8  --  1.8  --   --   PHOS  --   --  5.5*  --   --   < > = values in this interval not displayed.  CBC  Recent Labs Lab 06/08/16 2052 06/09/16 0206 06/10/16 0205  WBC 22.8* 15.8* 11.0*  HGB 13.7 12.2 11.8*  HCT 43.1 39.5 36.3  PLT 237 205 190    Coag's  Recent Labs Lab 06/05/16 1856 06/08/16 1826  APTT 29 33  INR 0.94 1.14    Sepsis Markers  Recent Labs Lab 06/08/16 2011 06/09/16 1133 06/09/16 1510 06/10/16 0205  LATICACIDVEN 2.1* 1.5 1.4  --   PROCALCITON  --  0.43  --  0.23    ABG  Recent Labs Lab 06/08/16 2031 06/10/16 0410  PHART 7.254* 7.487*  PCO2ART 63.9* 34.1*  PO2ART 478.0* 103.0*    Liver Enzymes  Recent Labs Lab 06/08/16 2052  AST 54*  ALT 60*  ALKPHOS 112  BILITOT 1.1  ALBUMIN 2.9*    Cardiac Enzymes No results for input(s): TROPONINI, PROBNP in the last 168 hours.  Glucose  Recent Labs Lab 06/09/16 1217 06/09/16 1556 06/09/16 2136 06/10/16 0001 06/10/16 0411 06/10/16 0741  GLUCAP 126* 122* 131* 130* 126* 142*    Imaging Ct Angio Head W/cm &/or Wo Cm  06/09/2016  CLINICAL DATA:  82 year old female status post  pacemaker placement yesterday with acute onset left side weakness. Initial encounter. EXAM: CT ANGIOGRAPHY HEAD AND NECK TECHNIQUE: Multidetector CT imaging of the head and neck was performed using the standard protocol during bolus administration of intravenous contrast. Multiplanar CT image reconstructions and MIPs were obtained to evaluate the vascular anatomy. Carotid stenosis measurements (when applicable) are obtained utilizing NASCET criteria, using the distal internal carotid diameter as the denominator. CONTRAST:  50 mL Isovue 370 COMPARISON:  Head CT without contrast 06/08/2016. Chest CT 05/07/2016. FINDINGS: CT HEAD Brain: Stable cerebral volume. No acute intracranial hemorrhage identified. No acute or evolving cortically based infarct identified. Patchy and confluent bilateral white  matter hypodensity appears stable and is nonspecific. Deep gray matter nuclei appear stable and within normal limits. Posterior fossa gray-white matter differentiation appears stable. Calvarium and skull base: Stable and intact. Paranasal sinuses: Stable and well pneumatized visualized paranasal sinuses and mastoids. Orbits: No acute orbit or scalp soft tissue findings. CTA NECK Skeleton: Degenerative changes in the lower cervical spine including multilevel mild spondylolisthesis. Osteopenia. No acute osseous abnormality identified. Other neck: Intubated. Endotracheal tube tip terminates above the carina. Small volume retained secretions in the trachea. Mild fluid in the pharynx. Layering left pleural effusion, likely moderate in size as fluid tracks to the left apex. Centrilobular emphysema in the visible lung parenchyma. Several small areas of patchy ground-glass opacity in the left upper lobe. There is a partially visible more solid-appearing 8 mm lung nodule on series 501, image 1 which is new since May. No superior mediastinal lymphadenopathy. Left thyroid goiter with dystrophic calcification and partial extension into the superior mediastinum. Negative superior parapharyngeal and retropharyngeal spaces. Sublingual space, submandibular glands and parotid glands are within normal limits. Cachexia. No cervical lymphadenopathy identified. Aortic arch: 3 vessel arch configuration with extensive calcified arch atherosclerosis. Right carotid system: No brachiocephalic artery or right CCA origin stenosis despite calcified plaque. No right CCA stenosis despite calcified plaque. Confluent calcified plaque at the right carotid bifurcation affecting the right ICA origin and to a lesser extent the bulb. Subsequent stenosis is less than 50 % with respect to the distal vessel. Negative cervical right ICA otherwise aside from tortuosity. Left carotid system: No left CCA stenosis despite soft and calcified plaque. Confluent  calcified plaque at the posterior left ICA origin. Subsequent stenosis is less than 50 % with respect to the distal vessel. Negative cervical left ICA otherwise. Vertebral arteries: No proximal left subclavian artery stenosis despite soft and calcified plaque. Left vertebral artery origin without stenosis despite soft plaque. Calcified plaque in the left V1, V2, and V3 segments with no hemodynamically significant stenosis. No proximal right subclavian artery stenosis despite calcified plaque. That plaque does affect the right vertebral artery origin with mild stenosis. There is calcified plaque in the right V1 segment with additional mild stenosis. Mild right V3 segment calcified plaque. No right vertebral artery stenosis to the skullbase. CTA HEAD Posterior circulation: Extensive calcified plaque in the bilateral vertebral artery V4 segments. Mild to moderate bilateral distal vertebral artery stenosis but preserved patency of the vertebrobasilar junction. No basilar artery stenosis. SCA and PCA origins are normal. Posterior communicating arteries are diminutive or absent. The left PCA P2 segment is moderately irregular, but there is preserved distal flow (series 5015, image 20). The right PCA branches are mildly irregular. Anterior circulation: Both ICA siphons are patent with extensive bilateral calcified atherosclerosis. Up to mild left siphon stenosis occurs in the proximal supraclinoid segment. On the right there is mild siphon stenosis  at the proximal cavernous segment, it and the proximal supraclinoid segment. Both carotid termini remain patent. Normal MCA and ACA origins. Diminutive or absent anterior communicating artery. Bilateral ACA branches are within normal limits. Left MCA M1 segment, bifurcation, and left MCA branches are patent. There is mild irregularity at the left bifurcation and in the left MCA branches. Right MCA M1 segment, bifurcation, and right MCA branches remain patent. There is mild  irregularity at the bifurcation and in the right MCA branches. Venous sinuses: Patent. Anatomic variants: None. Delayed phase: Round homogeneously enhancing 16 mm diameter lesion of the interhemispheric fissure (series 601, image 25) corresponds to the hyperdense lesion described yesterday and appears stable. Minimal to mild associated mass effect with no definite cerebral edema. No other abnormal intracranial enhancement. IMPRESSION: 1. Negative for emergent large vessel occlusion. 2. Extensive atherosclerosis, but no anterior circulation hemodynamically significant stenosis. There are mild bilateral ICA siphon stenoses. 3. Up to moderate stenoses of the bilateral vertebral arteries and left PCA P2 segment in the posterior circulation. 4. No acute or evolving cortically based infarct identified. Advanced white matter signal changes compatible with chronic small vessel disease. 5. Intubated. Small volume retained secretions in the trachea. Partially visible patchy and nodular left upper lung opacity is new since 05/07/2016 compatible with infectious or inflammatory process there is an associated moderate size layering left pleural effusion. Underlying emphysema. 6. Parafalcine 16 mm meningioma at the level of the cingulate gyri with no significant mass effect and no definite cerebral edema. Electronically Signed   By: Genevie Ann M.D.   On: 06/09/2016 15:54   Ct Angio Neck W/cm &/or Wo/cm  06/09/2016  CLINICAL DATA:  82 year old female status post pacemaker placement yesterday with acute onset left side weakness. Initial encounter. EXAM: CT ANGIOGRAPHY HEAD AND NECK TECHNIQUE: Multidetector CT imaging of the head and neck was performed using the standard protocol during bolus administration of intravenous contrast. Multiplanar CT image reconstructions and MIPs were obtained to evaluate the vascular anatomy. Carotid stenosis measurements (when applicable) are obtained utilizing NASCET criteria, using the distal  internal carotid diameter as the denominator. CONTRAST:  50 mL Isovue 370 COMPARISON:  Head CT without contrast 06/08/2016. Chest CT 05/07/2016. FINDINGS: CT HEAD Brain: Stable cerebral volume. No acute intracranial hemorrhage identified. No acute or evolving cortically based infarct identified. Patchy and confluent bilateral white matter hypodensity appears stable and is nonspecific. Deep gray matter nuclei appear stable and within normal limits. Posterior fossa gray-white matter differentiation appears stable. Calvarium and skull base: Stable and intact. Paranasal sinuses: Stable and well pneumatized visualized paranasal sinuses and mastoids. Orbits: No acute orbit or scalp soft tissue findings. CTA NECK Skeleton: Degenerative changes in the lower cervical spine including multilevel mild spondylolisthesis. Osteopenia. No acute osseous abnormality identified. Other neck: Intubated. Endotracheal tube tip terminates above the carina. Small volume retained secretions in the trachea. Mild fluid in the pharynx. Layering left pleural effusion, likely moderate in size as fluid tracks to the left apex. Centrilobular emphysema in the visible lung parenchyma. Several small areas of patchy ground-glass opacity in the left upper lobe. There is a partially visible more solid-appearing 8 mm lung nodule on series 501, image 1 which is new since May. No superior mediastinal lymphadenopathy. Left thyroid goiter with dystrophic calcification and partial extension into the superior mediastinum. Negative superior parapharyngeal and retropharyngeal spaces. Sublingual space, submandibular glands and parotid glands are within normal limits. Cachexia. No cervical lymphadenopathy identified. Aortic arch: 3 vessel arch configuration with extensive calcified arch  atherosclerosis. Right carotid system: No brachiocephalic artery or right CCA origin stenosis despite calcified plaque. No right CCA stenosis despite calcified plaque. Confluent  calcified plaque at the right carotid bifurcation affecting the right ICA origin and to a lesser extent the bulb. Subsequent stenosis is less than 50 % with respect to the distal vessel. Negative cervical right ICA otherwise aside from tortuosity. Left carotid system: No left CCA stenosis despite soft and calcified plaque. Confluent calcified plaque at the posterior left ICA origin. Subsequent stenosis is less than 50 % with respect to the distal vessel. Negative cervical left ICA otherwise. Vertebral arteries: No proximal left subclavian artery stenosis despite soft and calcified plaque. Left vertebral artery origin without stenosis despite soft plaque. Calcified plaque in the left V1, V2, and V3 segments with no hemodynamically significant stenosis. No proximal right subclavian artery stenosis despite calcified plaque. That plaque does affect the right vertebral artery origin with mild stenosis. There is calcified plaque in the right V1 segment with additional mild stenosis. Mild right V3 segment calcified plaque. No right vertebral artery stenosis to the skullbase. CTA HEAD Posterior circulation: Extensive calcified plaque in the bilateral vertebral artery V4 segments. Mild to moderate bilateral distal vertebral artery stenosis but preserved patency of the vertebrobasilar junction. No basilar artery stenosis. SCA and PCA origins are normal. Posterior communicating arteries are diminutive or absent. The left PCA P2 segment is moderately irregular, but there is preserved distal flow (series 5015, image 20). The right PCA branches are mildly irregular. Anterior circulation: Both ICA siphons are patent with extensive bilateral calcified atherosclerosis. Up to mild left siphon stenosis occurs in the proximal supraclinoid segment. On the right there is mild siphon stenosis at the proximal cavernous segment, it and the proximal supraclinoid segment. Both carotid termini remain patent. Normal MCA and ACA origins.  Diminutive or absent anterior communicating artery. Bilateral ACA branches are within normal limits. Left MCA M1 segment, bifurcation, and left MCA branches are patent. There is mild irregularity at the left bifurcation and in the left MCA branches. Right MCA M1 segment, bifurcation, and right MCA branches remain patent. There is mild irregularity at the bifurcation and in the right MCA branches. Venous sinuses: Patent. Anatomic variants: None. Delayed phase: Round homogeneously enhancing 16 mm diameter lesion of the interhemispheric fissure (series 601, image 25) corresponds to the hyperdense lesion described yesterday and appears stable. Minimal to mild associated mass effect with no definite cerebral edema. No other abnormal intracranial enhancement. IMPRESSION: 1. Negative for emergent large vessel occlusion. 2. Extensive atherosclerosis, but no anterior circulation hemodynamically significant stenosis. There are mild bilateral ICA siphon stenoses. 3. Up to moderate stenoses of the bilateral vertebral arteries and left PCA P2 segment in the posterior circulation. 4. No acute or evolving cortically based infarct identified. Advanced white matter signal changes compatible with chronic small vessel disease. 5. Intubated. Small volume retained secretions in the trachea. Partially visible patchy and nodular left upper lung opacity is new since 05/07/2016 compatible with infectious or inflammatory process there is an associated moderate size layering left pleural effusion. Underlying emphysema. 6. Parafalcine 16 mm meningioma at the level of the cingulate gyri with no significant mass effect and no definite cerebral edema. Electronically Signed   By: Genevie Ann M.D.   On: 06/09/2016 15:54   Dg Chest Port 1 View  06/10/2016  CLINICAL DATA:  Hypoxia EXAM: PORTABLE CHEST 1 VIEW COMPARISON:  June 09, 2016 FINDINGS: Endotracheal tube tip is 3.2 cm above the carina. Nasogastric tube  tip and side port are in the stomach.  Pacemaker leads are attached to the right atrium and right ventricle. There is no demonstrable pneumothorax. There has been interval clearing of infiltrate from the left base with only mild left base atelectasis remaining. There is mild perihilar interstitial edema, slightly less compared to 1 day prior. No new opacity. Lungs are hyperexpanded. Heart size and pulmonary vascularity are within normal limits. No adenopathy evident. IMPRESSION: Evidence of clearing of infiltrate from left base as well as partial but incomplete clearing of perihilar interstitial edema. There is residual atelectasis in the left base and mild perihilar interstitial edema currently. No new opacity. No change in cardiac silhouette. Tube positions as described without pneumothorax. Electronically Signed   By: Lowella Grip III M.D.   On: 06/10/2016 07:04   Dg Abd Portable 1v  06/09/2016  CLINICAL DATA:  Nasogastric tube placement. EXAM: PORTABLE ABDOMEN - 1 VIEW COMPARISON:  CT 05/07/16 FINDINGS: Nasogastric tube tip is at the level of the stomach. Epigastric sutures and clips related to previous stomach surgery. Contrast in the urinary collecting system related to recent CTA. Lung bases are grossly clear. Nonobstructive bowel gas pattern. Lumbar scoliosis. IMPRESSION: Nasogastric tube with tip at the stomach. Electronically Signed   By: Monte Fantasia M.D.   On: 06/09/2016 18:54     STUDIES: 6/13: CXR>>Right perihilar and left lower lobe infiltrates are present.  6/12: CT Brain>> Hyperdense lesion involving the falx near the vertex representing meningioma, 18 mm 6/13: CTA Brain >  1. Negative for emergent large vessel occlusion. 2. Extensive atherosclerosis, but no anterior circulation hemodynamically significant stenosis. There are mild bilateral ICA siphon stenoses. 3. Up to moderate stenoses of the bilateral vertebral arteries and left PCA P2 segment in the posterior circulation. 4. No acute or evolving cortically  based infarct identified. Advanced white matter signal changes compatible with chronic small vessel disease. 5. Intubated. Small volume retained secretions in the trachea. Partially visible patchy and nodular left upper lung opacity is new since 05/07/2016 compatible with infectious or inflammatory process there is an associated moderate size layering left pleural effusion. Underlying emphysema. 6. Parafalcine 16 mm meningioma at the level of the cingulate gyri with no significant mass effect and no definite cerebral edema.  CULTURES: 6/12: Sputum Culture>>few GNR, few GPC>>>   ANTIBIOTICS: 6/12: Levaquin>>> 6/12 Vanc>>>  SIGNIFICANT EVENTS: 6/12>> Pace Maker Placement 6/12>> Intubated Post Pace Placement 2/2 ? Stroke vs TIA  LINES/TUBES: 6/12: PIV 6/12: ETT 6/12: Foley  DISCUSSION: Admitted to [facility] after becoming unresponsive after pacemaker placement. Code stroke initiated due to patient not moving left side.  She was intubated on arrival to [facility]. CT/CTA completed. Neurology consulted.CT head shows meningioma, but 6/13  she is awake and alert, moving all extremities and following commands.    ASSESSMENT / PLAN:  PULMONARY A: Acute Respiratory Failure - r/t AMS, AECOPD, aspiration PNA  COPD  PNA - suspect aspiration  Mild respiratory alkalosis  P:   Continue attempts at PS wean  Continue BD - duoneb, budesonide  Continue solumedrol for AECOPD  abx as above  F/u CXR  F/u ABG   CARDIOVASCULAR A:  Hypertension Afib/Aflutter Tachy-brady syndrome s/p pacemaker  P:  Cards following  Continue Amiodarone gtt  Maintain SBP< 180 Heparin gtt per pharmacy    RENAL A:   Hypokalemia P:   Gentle volume  Replete K PRN  F/u chem   GASTROINTESTINAL A:   Protein calorie malnutrition  Weight Loss - Recent 50 Lb  weight loss (Maybe 2/2 COPD cachexia) P:   Start TF per nutrition  Monitor for refeeding syndrome  Consult nutrition for recommendations  HEMATOLOGIC A:    Iron Deficiency Anemia Polycythemia P:  Followed by oncology Heparin gtt per cards  F/u CBC    INFECTIOUS A:   Suspected  Aspiration  P:   Vanc, levaquin as above  F/u cultures  Trend pct    ENDOCRINE A:   No active issue  P:   Monitor glucose on chem   NEUROLOGIC A:   Stroke - r/t AFib  Incidental finding of Meningioma 6/12 P:   RASS goal: 1 Neurology following  Supportive care  Heparin gtt as above     FAMILY  - Updates: Husband  And daughter updated at bedside 6/14  - Inter-disciplinary family meet or Palliative Care meeting due by: 06/15/16   Nickolas Madrid, NP 06/10/2016  10:25 AM Pager: [phone] or [phone]  Tolerating some pressure support.  Scattered rhonchi.  HR regular.  Abd soft.  CXR with improved ASD.  Creatinine 1.01, Hb 11.8.  Assessment/plan: Acute respiratory failure. Aspiration pneumonia. COPD. - pressure support - continue Abx - f/u CXR  A fib with pauses s/p PM. - per cardiology  Acute encephalopathy - improved. - RASS goal 0 to -1  Updated pt's family at bedside.  CC time by me independent of APP time 31 minutes.  Chesley Mires, MD [facility] Pulmonary/Critical Care 06/10/2016, 11:05 AM Pager:  [phone] After 3pm call: [phone]

## 2016-06-10 NOTE — Progress Notes (Signed)
[place] for Heparin Indication: Afib and CVA  Allergies  Allergen Reactions  . Penicillins Hives    Has patient had a PCN reaction causing immediate rash, facial/tongue/throat swelling, SOB or lightheadedness with hypotension: Yes Has patient had a PCN reaction causing severe rash involving mucus membranes or skin necrosis: No Has patient had a PCN reaction that required hospitalization No Has patient had a PCN reaction occurring within the last 10 years. No If all of the above answers are "NO", then may proceed with Cephalosporin use.    Patient Measurements: Height: 5\' 6"  (167.6 cm) Weight: 89 lb 11.6 oz (40.7 kg) IBW/kg (Calculated) : 59.3 Heparin Dosing Weight: 41 kg  Vital Signs: Temp: 98 F (36.7 C) (06/14 1147) Temp Source: Oral (06/14 1147) BP: 134/92 mmHg (06/14 1147) Pulse Rate: 91 (06/14 1147)  Labs:  Recent Labs  06/07/16 2355  06/08/16 1826 06/08/16 2052 06/09/16 0206 06/10/16 0205 06/10/16 1108  HGB  --   < >  --  13.7 12.2 11.8*  --   HCT  --   --   --  43.1 39.5 36.3  --   PLT  --   --   --  237 205 190  --   APTT  --   --  33  --   --   --   --   LABPROT  --   --  14.8  --   --   --   --   INR  --   --  1.14  --   --   --   --   HEPARINUNFRC 0.20*  --   --   --   --  <0.10* <0.10*  CREATININE  --   --   --  1.07* 0.99 1.01*  --   < > = values in this interval not displayed.  Estimated Creatinine Clearance: 25.7 mL/min (by C-G formula based on Cr of 1.01).   Medical History: Past Medical History  Diagnosis Date  . Essential hypertension   . Osteoporosis   . Lower back pain   . History of stomach ulcers     a. s/p surgery ~ 40 yrs ago.  . Polycythemia     a. phlebotomy.  Marland Kitchen COPD (chronic obstructive pulmonary disease) ([place])   . IDA (iron deficiency anemia)     a. 04/2016 CT Abd: no malignancy.  . Weight loss, unintentional   . Thyroid nodule     a. 04/2016 large left thyroid nodule on CT chest  - rec  dedicated u/s.  . Pulmonary nodule, left     a. 04/2016 CT chest: 84mm LUL nodule - f/u CT in 12 months (h/o smoking).  Marland Kitchen PAD (peripheral artery disease) ([place])     a. 04/2016 CT Abd: occluded prox LSFA.  . Superior mesenteric artery stenosis ([place])     a. 04/2016 CT Abd: atheromatous plaque and mural thrombus in SMA.  . Tobacco abuse   . New onset atrial flutter ([place]) 05/2016  . Tachy-brady syndrome ([place]) 05/2016    a. to [facility] for PPM placement    Assessment: 78 [place] who presented to [facility] on 6/9 with new onset Afib with RVR. The patient converted briefly to NSR with medications though this was not sustainable. On 6/12 the patient was transferred to [facility] for PPM placement by Dr. Lovena Le. Post placement, the patient was noted to have left sided weakness and a right gaze - code stroke was called. Neuro was  consulted - the patient is noted to have a R-CVA thought to be d/t Afib. Repeat CT head/neck showed small R brain infarct. Pt remains SUBtherapeutic on current heparin rate. CBC wnl.    Goal of Therapy:  Heparin level 0.3-0.5 units/ml Monitor platelets by anticoagulation protocol: Yes    Plan:  -Increase heparin to 850 units/hr -Daily HL, CBC -Check level this evening    Hughes Better, PharmD, BCPS Clinical Pharmacist Pager: [phone] 06/10/2016 1:01 PM

## 2016-06-10 NOTE — Progress Notes (Signed)
Pt K 2.9. MD Notified and replacement ordered. Converted back into Afib/Aflutter 100's HR. MD stated may increase rate of amiodarone if rate 120 or above.

## 2016-06-10 NOTE — Progress Notes (Signed)
ANTICOAGULATION CONSULT NOTE - Follow-up Consult  Pharmacy Consult for Heparin Indication: Afib and CVA  Allergies  Allergen Reactions  . Penicillins Hives    Has patient had a PCN reaction causing immediate rash, facial/tongue/throat swelling, SOB or lightheadedness with hypotension: Yes Has patient had a PCN reaction causing severe rash involving mucus membranes or skin necrosis: No Has patient had a PCN reaction that required hospitalization No Has patient had a PCN reaction occurring within the last 10 years. No If all of the above answers are "NO", then may proceed with Cephalosporin use.    Patient Measurements: Height: 5\' 6"  (167.6 cm) Weight: 90 lb 9.7 oz (41.1 kg) IBW/kg (Calculated) : 59.3 Heparin Dosing Weight: 41 kg  Vital Signs: Temp: 99 F (37.2 C) (06/14 0000) Temp Source: Oral (06/14 0000) BP: 148/72 mmHg (06/14 0000) Pulse Rate: 61 (06/14 0301)  Labs:  Recent Labs  06/07/16 1502 06/07/16 2355  06/08/16 1826 06/08/16 2052 06/09/16 0206 06/10/16 0205  HGB  --   --   < >  --  13.7 12.2 11.8*  HCT  --   --   --   --  43.1 39.5 36.3  PLT  --   --   --   --  237 205 190  APTT  --   --   --  33  --   --   --   LABPROT  --   --   --  14.8  --   --   --   INR  --   --   --  1.14  --   --   --   HEPARINUNFRC 0.39 0.20*  --   --   --   --  <0.10*  CREATININE  --   --   --   --  1.07* 0.99  --   < > = values in this interval not displayed.  Estimated Creatinine Clearance: 26.5 mL/min (by C-G formula based on Cr of 0.99).   Assessment: 47 YOF who presented to [facility] on 6/9 with new onset Afib with RVR. The patient converted briefly to NSR with medications though this was not sustainable. On 6/12 the patient was transferred to [facility] for PPM placement by Dr. Lovena Le. Post placement, the patient was noted to have left sided weakness and a right gaze - code stroke was called. Neuro was consulted - the patient is noted to have a R-CVA thought to be d/t Afib. CTA  head/neck showed no acute bleeding. Pharmacy consulted to start heparin for Afib.  Heparin level undetectable on 500 units/hr. No issues with line or bleeding reported per RN.  Goal of Therapy:  Heparin level 0.3-0.5 units/ml Monitor platelets by anticoagulation protocol: Yes   Plan:  Increase heparin to 650 units/hr. No bolus with recent CVA. F/u heparin level in 8 hrs  Sherlon Handing, PharmD, BCPS Clinical pharmacist, pager [phone] 06/10/2016 3:14 AM

## 2016-06-10 NOTE — Progress Notes (Signed)
SUBJECTIVE: The patient is doing much better today.  She is awake and alert, moving all of her extremities, again asking for the tube to come out, shakes head "no" to questions of pain.  Marland Kitchen antiseptic oral rinse  7 mL Mouth Rinse QID  . aspirin  300 mg Rectal Daily  . atorvastatin  10 mg Oral q1800  . budesonide (PULMICORT) nebulizer solution  0.5 mg Nebulization BID  . chlorhexidine gluconate (SAGE KIT)  15 mL Mouth Rinse BID  . ipratropium-albuterol  3 mL Nebulization Q6H  . levofloxacin (LEVAQUIN) IV  500 mg Intravenous Q48H  . methylPREDNISolone (SOLU-MEDROL) injection  20 mg Intravenous Q12H  . pantoprazole sodium  40 mg Per Tube Q24H  . vancomycin  1,000 mg Intravenous To SS-Surg   . amiodarone 30 mg/hr (06/10/16 0800)  . heparin 650 Units/hr (06/10/16 0800)    OBJECTIVE: Physical Exam: Filed Vitals:   06/10/16 0700 06/10/16 0738 06/10/16 0800 06/10/16 0850  BP: 138/64 126/65 135/83   Pulse: 58 97 90   Temp:  97.9 F (36.6 C)    TempSrc:  Oral    Resp: _0 Height:      Weight:      SpO2: 100% 100% 100% 100%    Intake/Output Summary (Last 24 hours) at 06/10/16 9357 Last data filed at 06/10/16 0900  Gross per 24 hour  Intake 586.86 ml  Output    585 ml  Net   1.86 ml    Telemetry reveals SR with PAFlutter at time RVR   GEN- The patient is intubated, alert and oriented, nods/shakes head yes/no, follows directions, motions to remove ETT   Head- normocephalic, atraumatic Eyes-  Sclera clear, conjunctiva pink Ears- hearing intact Oropharynx- clear Neck- supple, no JVP Lungs- Clear to ausculation bilaterally,not wheezing Heart- Iregular rate and rhythm, tachycardic, no significant murmurs, no rubs or gallops GI- soft, NT, ND Extremities- no clubbing, cyanosis, or edema Skin- no rash or lesion Psych- euthymic mood, full affect Neuro- moving all extremities this morning, following directions  PPM site is stable, dressing is dry, no  hematoma   LABS: Basic Metabolic Panel:  Recent Labs  06/08/16 2052 06/09/16 0206 06/10/16 0205  NA 137 136 135  K 3.7 3.1* 3.6  CL 100* 99* 99*  CO2 _1 GLUCOSE 148* 175* 114*  BUN 22* 24* 24*  CREATININE 1.07* 0.99 1.01*  CALCIUM 8.6* 8.4* 8.7*  MG 1.8  --   --   PHOS 5.5*  --   --    Liver Function Tests:  Recent Labs  06/08/16 2052  AST 54*  ALT 60*  ALKPHOS 112  BILITOT 1.1  PROT 5.8*  ALBUMIN 2.9*   CBC:  Recent Labs  06/08/16 2052 06/09/16 0206 06/10/16 0205  WBC 22.8* 15.8* 11.0*  NEUTROABS 21.4*  --  10.5*  HGB 13.7 12.2 11.8*  HCT 43.1 39.5 36.3  MCV 85.9 84.9 83.4  PLT 237 205 190   Fasting Lipid Panel:  Recent Labs  06/08/16 2052  CHOL 162  HDL 61  LDLCALC 83  TRIG 92  CHOLHDL 2.7    RADIOLOGY: 06/09/16 CT angio head IMPRESSION: 1. Negative for emergent large vessel occlusion. 2. Extensive atherosclerosis, but no anterior circulation hemodynamically significant stenosis. There are mild bilateral ICA siphon stenoses. 3. Up to moderate stenoses of the bilateral vertebral arteries and left PCA P2 segment in the posterior circulation. 4. No acute or evolving cortically based infarct identified. Advanced  white matter signal changes compatible with chronic small vessel disease. 5. Intubated. Small volume retained secretions in the trachea. Partially visible patchy and nodular left upper lung opacity is new since 05/07/2016 compatible with infectious or inflammatory process there is an associated moderate size layering left pleural effusion. Underlying emphysema. 6. Parafalcine 16 mm meningioma at the level of the cingulate gyri with no significant mass effect and no definite cerebral edema.  Dg Chest Port 1 View 06/08/2016  CLINICAL DATA:  Unresponsive with left-sided weakness following pacemaker placement. EXAM: PORTABLE CHEST 1 VIEW COMPARISON:  Radiographs 04/04/2010.  CT 05/07/2016. FINDINGS: 1727 hours. Left subclavian  pacemaker leads project over the right atrium and right ventricle. The heart size and mediastinal contours are stable allowing for patient rotation to the right. There is diffuse aortic atherosclerosis. There is vascular congestion with new asymmetric perihilar and lower lobe airspace disease on the left. No pneumothorax or significant pleural effusion identified. The bones appear unchanged. IMPRESSION: 1. The left subclavian pacemaker leads appear satisfactorily positioned in the right atrium and right ventricle. No pneumothorax. 2. New vascular congestion with asymmetric airspace disease on the left consistent with asymmetric edema or aspiration. Electronically Signed   By: Richardean Sale M.D.   On: 06/08/2016 17:44    ASSESSMENT AND PLAN:   1. Tachy-brady syndrome, transferred yesterday from [facility] for PPM      S/p PPM 06/08/16 with Dr. Lovena Le      Intact device post implant 06/09/16      cxr immediate post procedure and 06/09/16 without ptx      PPM site stable, dressing is dry, no hematoma       2. Intra-post PPM procedure respiratory depression/change in MS/L sided defecit     Appreciate neuro     Initial CT negative for acute changes, CT angio noted above     Suspect suffered right brain associated with atrial fibrillation     Started on ASA per neuro     BP peramaters as per neuro (140-160 SBP)     Neuro-wise she remains much improved this morning, await further neurology eval and recommendations today  3. PAFib (new)     On heparin gtt initially, held for her PPM implant     Heparin gtt re-started 06/09/16, management with pharmacy , H/H stable     Neuro note to consider long term a/c when able to take PO     continue amio gtt for now, she is currently in SR with PAFlutter bursts of RVR noted.   4. COPD/emphysema     Intubuated, unable to wean yesterday, or this morning     Management with CCM, appreciate help and input     ?aspiration vs edema     Patient requests tube out, defer to  CCM     Husband at bedside states she is a longstanding heavy smoker until the day she went into the hospital  5. PVD     Recent hx of 50 lbs weight loss, anorexia, with mesenteric ischemia.subtotal occ of SMA, occ L SFA  6. HTN     Permissive BP as per neuro parameters  7. Hypokalemia     better  8. Leukocytosis     Down to 11.0 today     procalcitonin 0.23 today     Will defer to CCM, ?aspiration, on levaquin        Tommye Standard, PA-C 06/10/2016 9:04 AM   Appreciate help from the consultants-- they are doing the heavy lifting  Continue amio and heparin

## 2016-06-10 NOTE — Progress Notes (Signed)
eLink Physician-Brief Progress Note Patient Name: Lydia [name] DOB: September 26, 1927 MRN: [id number]   Date of Service  06/10/2016  HPI/Events of Note  Periodic afib with RVR and PVCs but HR < 110 and HD stable EP service  eICU Interventions  Check BMET Don't change amiodarone gtt for now     Intervention Category Major Interventions: Arrhythmia - evaluation and management  Simonne Maffucci 06/10/2016, 9:08 PM

## 2016-06-11 ENCOUNTER — Inpatient Hospital Stay (HOSPITAL_COMMUNITY): Payer: Medicare HMO

## 2016-06-11 ENCOUNTER — Encounter (HOSPITAL_COMMUNITY): Payer: Self-pay

## 2016-06-11 DIAGNOSIS — J441 Chronic obstructive pulmonary disease with (acute) exacerbation: Secondary | ICD-10-CM

## 2016-06-11 LAB — CBC
HEMATOCRIT: 38.4 % (ref 36.0–46.0)
Hemoglobin: 12 g/dL (ref 12.0–15.0)
MCH: 26 pg (ref 26.0–34.0)
MCHC: 31.3 g/dL (ref 30.0–36.0)
MCV: 83.1 fL (ref 78.0–100.0)
PLATELETS: 236 10*3/uL (ref 150–400)
RBC: 4.62 MIL/uL (ref 3.87–5.11)
WBC: 12.4 10*3/uL — ABNORMAL HIGH (ref 4.0–10.5)

## 2016-06-11 LAB — BASIC METABOLIC PANEL
Anion gap: 10 (ref 5–15)
Anion gap: 11 (ref 5–15)
BUN: 28 mg/dL — AB (ref 6–20)
BUN: 30 mg/dL — AB (ref 6–20)
CALCIUM: 9 mg/dL (ref 8.9–10.3)
CHLORIDE: 100 mmol/L — AB (ref 101–111)
CO2: 28 mmol/L (ref 22–32)
CO2: 23 mmol/L (ref 22–32)
CREATININE: 0.83 mg/dL (ref 0.44–1.00)
CREATININE: 0.84 mg/dL (ref 0.44–1.00)
Calcium: 9.2 mg/dL (ref 8.9–10.3)
Chloride: 98 mmol/L — ABNORMAL LOW (ref 101–111)
GFR calc Af Amer: >60 mL/min (ref >60)
GFR calc non Af Amer: >60 mL/min (ref >60)
Glucose, Bld: 168 mg/dL — ABNORMAL HIGH (ref 65–99)
Glucose, Bld: 173 mg/dL — ABNORMAL HIGH (ref 65–99)
POTASSIUM: 4.7 mmol/L (ref 3.5–5.1)
Potassium: 3.9 mmol/L (ref 3.5–5.1)
Sodium: 136 mmol/L (ref 135–145)
Sodium: 134 mmol/L — ABNORMAL LOW (ref 135–145)

## 2016-06-11 LAB — GLUCOSE, CAPILLARY
GLUCOSE-CAPILLARY: 167 mg/dL — AB (ref 65–99)
GLUCOSE-CAPILLARY: 142 mg/dL — AB (ref 65–99)
Glucose-Capillary: 100 mg/dL — ABNORMAL HIGH (ref 65–99)
Glucose-Capillary: 130 mg/dL — ABNORMAL HIGH (ref 65–99)
Glucose-Capillary: 150 mg/dL — ABNORMAL HIGH (ref 65–99)

## 2016-06-11 LAB — HEPARIN LEVEL (UNFRACTIONATED)
HEPARIN UNFRACTIONATED: 0.28 [IU]/mL — AB (ref 0.30–0.70)
Heparin Unfractionated: 0.32 IU/mL (ref 0.30–0.70)

## 2016-06-11 LAB — MAGNESIUM: Magnesium: 1.9 mg/dL (ref 1.7–2.4)

## 2016-06-11 LAB — CULTURE, RESPIRATORY W GRAM STAIN: Culture: NORMAL

## 2016-06-11 LAB — CULTURE, RESPIRATORY

## 2016-06-11 LAB — PROCALCITONIN: PROCALCITONIN: 0.16 ng/mL

## 2016-06-11 LAB — PHOSPHORUS: Phosphorus: 3.9 mg/dL (ref 2.5–4.6)

## 2016-06-11 MED ORDER — POLYETHYLENE GLYCOL 3350 17 G PO PACK
17.0000 g | PACK | Freq: Every day | ORAL | Status: DC | PRN
  Administered 2016-06-14 – 2016-06-16 (×3): 17 g via ORAL
  Filled 2016-06-11 (×3): qty 1

## 2016-06-11 MED ORDER — METHYLPREDNISOLONE SODIUM SUCC 40 MG IJ SOLR
40.0000 mg | Freq: Two times a day (BID) | INTRAMUSCULAR | Status: DC
Start: 2016-06-11 — End: 2016-06-11
  Administered 2016-06-11: 40 mg via INTRAVENOUS
  Filled 2016-06-11: qty 1

## 2016-06-11 MED ORDER — CETYLPYRIDINIUM CHLORIDE 0.05 % MT LIQD
7.0000 mL | Freq: Two times a day (BID) | OROMUCOSAL | Status: DC
  Administered 2016-06-12 – 2016-06-16 (×7): 7 mL via OROMUCOSAL

## 2016-06-11 MED ORDER — ARFORMOTEROL TARTRATE 15 MCG/2ML IN NEBU
15.0000 ug | INHALATION_SOLUTION | Freq: Two times a day (BID) | RESPIRATORY_TRACT | Status: DC
  Administered 2016-06-11 – 2016-06-16 (×10): 15 ug via RESPIRATORY_TRACT
  Filled 2016-06-11 (×10): qty 2

## 2016-06-11 MED ORDER — PANTOPRAZOLE SODIUM 40 MG PO TBEC
40.0000 mg | DELAYED_RELEASE_TABLET | Freq: Every day | ORAL | Status: DC
  Administered 2016-06-12 – 2016-06-16 (×5): 40 mg via ORAL
  Filled 2016-06-11 (×5): qty 1

## 2016-06-11 MED ORDER — METHYLPREDNISOLONE SODIUM SUCC 40 MG IJ SOLR
20.0000 mg | Freq: Two times a day (BID) | INTRAMUSCULAR | Status: DC
  Administered 2016-06-11: 20 mg via INTRAVENOUS
  Filled 2016-06-11: qty 1

## 2016-06-11 MED ORDER — POTASSIUM CHLORIDE 10 MEQ/100ML IV SOLN
10.0000 meq | INTRAVENOUS | Status: DC
  Administered 2016-06-11 (×2): 10 meq via INTRAVENOUS
  Filled 2016-06-11 (×4): qty 100

## 2016-06-11 MED ORDER — INSULIN ASPART 100 UNIT/ML ~~LOC~~ SOLN
0.0000 [IU] | SUBCUTANEOUS | Status: DC
  Administered 2016-06-11: 2 [IU] via SUBCUTANEOUS
  Administered 2016-06-11: 3 [IU] via SUBCUTANEOUS
  Administered 2016-06-11: 2 [IU] via SUBCUTANEOUS

## 2016-06-11 MED ORDER — ASPIRIN 325 MG PO TABS
325.0000 mg | ORAL_TABLET | Freq: Every day | ORAL | Status: DC
  Administered 2016-06-11: 325 mg via ORAL
  Filled 2016-06-11: qty 1

## 2016-06-11 MED ORDER — ADULT MULTIVITAMIN LIQUID CH
15.0000 mL | Freq: Every day | ORAL | Status: DC
  Administered 2016-06-12 – 2016-06-13 (×2): 15 mL via ORAL
  Filled 2016-06-11 (×4): qty 15

## 2016-06-11 NOTE — Progress Notes (Signed)
Patient's K on AM BMET 4.7.  Patient due to receive 2 more of 4 total runs of KCL and 40 mEq per tube.  Verbal order per Dr. Caryl Comes received to re-check BMET before administering remaining K.  Will continue to monitor. [place], Ardeth Sportsman

## 2016-06-11 NOTE — Progress Notes (Signed)
STROKE TEAM PROGRESS NOTE   HISTORY OF PRESENT ILLNESS (per record) Lydia [name] is a 82 y.o. female with history of severe COPD, peripheral vascular disease, recent history of 50 pound weight loss, initially transferred from [facility] where she presented with atrial fibrillation with RVR. Patient was getting a pacemaker procedure. Last known normal around 4 PM 06/08/2106 (LKW). During the end of the procedure patient was making gurgling noises and was not moving her left side. Patient was treated with IV heparin until 3 AM last night. Code stroke was activated around 6 PM. Patient was transferred to medical ICU for intubation to protect her airway. CT scan was done 6:50 PM which was reviewed and shows signs of severe small vessel disease without any acute findings. Patient was not administered IV t-PA.    SUBJECTIVE (INTERVAL HISTORY)  She seems to be neurologically stable moving her left side well now. She is yet intubated but plans are for extubation later today. She remains on heparin drip.  OBJECTIVE Temp:  [97.6 F (36.4 C)-98.2 F (36.8 C)] 97.7 F (36.5 C) (06/15 1202) Pulse Rate:  [28-109] 69 (06/15 1202) Cardiac Rhythm:  [-] Normal sinus rhythm (06/15 0800) Resp:  [16-22] 18 (06/15 1202) BP: (122-162)/(57-115) 137/68 mmHg (06/15 1202) SpO2:  [73 %-100 %] 100 % (06/15 1202) FiO2 (%):  [30 %] 30 % (06/15 0836) Weight:  [85 lb 8.6 oz (38.8 kg)] 85 lb 8.6 oz (38.8 kg) (06/15 0500)  CBC:   Recent Labs Lab 06/08/16 2052  06/10/16 0205 06/11/16 0457  WBC 22.8*  < > 11.0* 12.4*  NEUTROABS 21.4*  --  10.5*  --   HGB 13.7  < > 11.8* 12.0  HCT 43.1  < > 36.3 38.4  MCV 85.9  < > 83.4 83.1  PLT 237  < > 190 236  < > = values in this interval not displayed.  Basic Metabolic Panel:   Recent Labs Lab 06/10/16 1625 06/10/16 2109 06/11/16 0457 06/11/16 1026  NA  --  134* 136 134*  K  --  2.9* 4.7 3.9  CL  --  97* 98* 100*  CO2  --  28 28 23   GLUCOSE  --   137* 168* 173*  BUN  --  29* 28* 30*  CREATININE  --  1.01* 0.83 0.84  CALCIUM  --  9.1 9.2 9.0  MG 1.7 1.8 1.9  --   PHOS 4.9*  --  3.9  --     Lipid Panel:     Component Value Date/Time   CHOL 162 06/08/2016 2052   CHOL 175 11/08/2014 1341   TRIG 92 06/08/2016 2052   TRIG 109 11/08/2014 1341   HDL 61 06/08/2016 2052   HDL 68* 11/08/2014 1341   CHOLHDL 2.7 06/08/2016 2052   VLDL 18 06/08/2016 2052   VLDL 22 11/08/2014 1341   LDLCALC 83 06/08/2016 2052   LDLCALC 85 11/08/2014 1341   HgbA1c:  Lab Results  Component Value Date   HGBA1C 5.4 06/08/2016   Urine Drug Screen: No results found for: LABOPIA, COCAINSCRNUR, LABBENZ, AMPHETMU, THCU, LABBARB    IMAGING  Ct Angio Head W/cm &/or Wo Cm  06/09/2016  CLINICAL DATA:  82 year old female status post pacemaker placement yesterday with acute onset left side weakness. Initial encounter. EXAM: CT ANGIOGRAPHY HEAD AND NECK TECHNIQUE: Multidetector CT imaging of the head and neck was performed using the standard protocol during bolus administration of intravenous contrast. Multiplanar CT image reconstructions and MIPs  were obtained to evaluate the vascular anatomy. Carotid stenosis measurements (when applicable) are obtained utilizing NASCET criteria, using the distal internal carotid diameter as the denominator. CONTRAST:  50 mL Isovue 370 COMPARISON:  Head CT without contrast 06/08/2016. Chest CT 05/07/2016. FINDINGS: CT HEAD Brain: Stable cerebral volume. No acute intracranial hemorrhage identified. No acute or evolving cortically based infarct identified. Patchy and confluent bilateral white matter hypodensity appears stable and is nonspecific. Deep gray matter nuclei appear stable and within normal limits. Posterior fossa gray-white matter differentiation appears stable. Calvarium and skull base: Stable and intact. Paranasal sinuses: Stable and well pneumatized visualized paranasal sinuses and mastoids. Orbits: No acute orbit or scalp  soft tissue findings. CTA NECK Skeleton: Degenerative changes in the lower cervical spine including multilevel mild spondylolisthesis. Osteopenia. No acute osseous abnormality identified. Other neck: Intubated. Endotracheal tube tip terminates above the carina. Small volume retained secretions in the trachea. Mild fluid in the pharynx. Layering left pleural effusion, likely moderate in size as fluid tracks to the left apex. Centrilobular emphysema in the visible lung parenchyma. Several small areas of patchy ground-glass opacity in the left upper lobe. There is a partially visible more solid-appearing 8 mm lung nodule on series 501, image 1 which is new since May. No superior mediastinal lymphadenopathy. Left thyroid goiter with dystrophic calcification and partial extension into the superior mediastinum. Negative superior parapharyngeal and retropharyngeal spaces. Sublingual space, submandibular glands and parotid glands are within normal limits. Cachexia. No cervical lymphadenopathy identified. Aortic arch: 3 vessel arch configuration with extensive calcified arch atherosclerosis. Right carotid system: No brachiocephalic artery or right CCA origin stenosis despite calcified plaque. No right CCA stenosis despite calcified plaque. Confluent calcified plaque at the right carotid bifurcation affecting the right ICA origin and to a lesser extent the bulb. Subsequent stenosis is less than 50 % with respect to the distal vessel. Negative cervical right ICA otherwise aside from tortuosity. Left carotid system: No left CCA stenosis despite soft and calcified plaque. Confluent calcified plaque at the posterior left ICA origin. Subsequent stenosis is less than 50 % with respect to the distal vessel. Negative cervical left ICA otherwise. Vertebral arteries: No proximal left subclavian artery stenosis despite soft and calcified plaque. Left vertebral artery origin without stenosis despite soft plaque. Calcified plaque in the  left V1, V2, and V3 segments with no hemodynamically significant stenosis. No proximal right subclavian artery stenosis despite calcified plaque. That plaque does affect the right vertebral artery origin with mild stenosis. There is calcified plaque in the right V1 segment with additional mild stenosis. Mild right V3 segment calcified plaque. No right vertebral artery stenosis to the skullbase. CTA HEAD Posterior circulation: Extensive calcified plaque in the bilateral vertebral artery V4 segments. Mild to moderate bilateral distal vertebral artery stenosis but preserved patency of the vertebrobasilar junction. No basilar artery stenosis. SCA and PCA origins are normal. Posterior communicating arteries are diminutive or absent. The left PCA P2 segment is moderately irregular, but there is preserved distal flow (series 5015, image 20). The right PCA branches are mildly irregular. Anterior circulation: Both ICA siphons are patent with extensive bilateral calcified atherosclerosis. Up to mild left siphon stenosis occurs in the proximal supraclinoid segment. On the right there is mild siphon stenosis at the proximal cavernous segment, it and the proximal supraclinoid segment. Both carotid termini remain patent. Normal MCA and ACA origins. Diminutive or absent anterior communicating artery. Bilateral ACA branches are within normal limits. Left MCA M1 segment, bifurcation, and left MCA branches are  patent. There is mild irregularity at the left bifurcation and in the left MCA branches. Right MCA M1 segment, bifurcation, and right MCA branches remain patent. There is mild irregularity at the bifurcation and in the right MCA branches. Venous sinuses: Patent. Anatomic variants: None. Delayed phase: Round homogeneously enhancing 16 mm diameter lesion of the interhemispheric fissure (series 601, image 25) corresponds to the hyperdense lesion described yesterday and appears stable. Minimal to mild associated mass effect with no  definite cerebral edema. No other abnormal intracranial enhancement. IMPRESSION: 1. Negative for emergent large vessel occlusion. 2. Extensive atherosclerosis, but no anterior circulation hemodynamically significant stenosis. There are mild bilateral ICA siphon stenoses. 3. Up to moderate stenoses of the bilateral vertebral arteries and left PCA P2 segment in the posterior circulation. 4. No acute or evolving cortically based infarct identified. Advanced white matter signal changes compatible with chronic small vessel disease. 5. Intubated. Small volume retained secretions in the trachea. Partially visible patchy and nodular left upper lung opacity is new since 05/07/2016 compatible with infectious or inflammatory process there is an associated moderate size layering left pleural effusion. Underlying emphysema. 6. Parafalcine 16 mm meningioma at the level of the cingulate gyri with no significant mass effect and no definite cerebral edema. Electronically Signed   By: Genevie Ann M.D.   On: 06/09/2016 15:54   Ct Angio Neck W/cm &/or Wo/cm  06/09/2016  CLINICAL DATA:  82 year old female status post pacemaker placement yesterday with acute onset left side weakness. Initial encounter. EXAM: CT ANGIOGRAPHY HEAD AND NECK TECHNIQUE: Multidetector CT imaging of the head and neck was performed using the standard protocol during bolus administration of intravenous contrast. Multiplanar CT image reconstructions and MIPs were obtained to evaluate the vascular anatomy. Carotid stenosis measurements (when applicable) are obtained utilizing NASCET criteria, using the distal internal carotid diameter as the denominator. CONTRAST:  50 mL Isovue 370 COMPARISON:  Head CT without contrast 06/08/2016. Chest CT 05/07/2016. FINDINGS: CT HEAD Brain: Stable cerebral volume. No acute intracranial hemorrhage identified. No acute or evolving cortically based infarct identified. Patchy and confluent bilateral white matter hypodensity appears  stable and is nonspecific. Deep gray matter nuclei appear stable and within normal limits. Posterior fossa gray-white matter differentiation appears stable. Calvarium and skull base: Stable and intact. Paranasal sinuses: Stable and well pneumatized visualized paranasal sinuses and mastoids. Orbits: No acute orbit or scalp soft tissue findings. CTA NECK Skeleton: Degenerative changes in the lower cervical spine including multilevel mild spondylolisthesis. Osteopenia. No acute osseous abnormality identified. Other neck: Intubated. Endotracheal tube tip terminates above the carina. Small volume retained secretions in the trachea. Mild fluid in the pharynx. Layering left pleural effusion, likely moderate in size as fluid tracks to the left apex. Centrilobular emphysema in the visible lung parenchyma. Several small areas of patchy ground-glass opacity in the left upper lobe. There is a partially visible more solid-appearing 8 mm lung nodule on series 501, image 1 which is new since May. No superior mediastinal lymphadenopathy. Left thyroid goiter with dystrophic calcification and partial extension into the superior mediastinum. Negative superior parapharyngeal and retropharyngeal spaces. Sublingual space, submandibular glands and parotid glands are within normal limits. Cachexia. No cervical lymphadenopathy identified. Aortic arch: 3 vessel arch configuration with extensive calcified arch atherosclerosis. Right carotid system: No brachiocephalic artery or right CCA origin stenosis despite calcified plaque. No right CCA stenosis despite calcified plaque. Confluent calcified plaque at the right carotid bifurcation affecting the right ICA origin and to a lesser extent the bulb. Subsequent stenosis  is less than 50 % with respect to the distal vessel. Negative cervical right ICA otherwise aside from tortuosity. Left carotid system: No left CCA stenosis despite soft and calcified plaque. Confluent calcified plaque at the  posterior left ICA origin. Subsequent stenosis is less than 50 % with respect to the distal vessel. Negative cervical left ICA otherwise. Vertebral arteries: No proximal left subclavian artery stenosis despite soft and calcified plaque. Left vertebral artery origin without stenosis despite soft plaque. Calcified plaque in the left V1, V2, and V3 segments with no hemodynamically significant stenosis. No proximal right subclavian artery stenosis despite calcified plaque. That plaque does affect the right vertebral artery origin with mild stenosis. There is calcified plaque in the right V1 segment with additional mild stenosis. Mild right V3 segment calcified plaque. No right vertebral artery stenosis to the skullbase. CTA HEAD Posterior circulation: Extensive calcified plaque in the bilateral vertebral artery V4 segments. Mild to moderate bilateral distal vertebral artery stenosis but preserved patency of the vertebrobasilar junction. No basilar artery stenosis. SCA and PCA origins are normal. Posterior communicating arteries are diminutive or absent. The left PCA P2 segment is moderately irregular, but there is preserved distal flow (series 5015, image 20). The right PCA branches are mildly irregular. Anterior circulation: Both ICA siphons are patent with extensive bilateral calcified atherosclerosis. Up to mild left siphon stenosis occurs in the proximal supraclinoid segment. On the right there is mild siphon stenosis at the proximal cavernous segment, it and the proximal supraclinoid segment. Both carotid termini remain patent. Normal MCA and ACA origins. Diminutive or absent anterior communicating artery. Bilateral ACA branches are within normal limits. Left MCA M1 segment, bifurcation, and left MCA branches are patent. There is mild irregularity at the left bifurcation and in the left MCA branches. Right MCA M1 segment, bifurcation, and right MCA branches remain patent. There is mild irregularity at the  bifurcation and in the right MCA branches. Venous sinuses: Patent. Anatomic variants: None. Delayed phase: Round homogeneously enhancing 16 mm diameter lesion of the interhemispheric fissure (series 601, image 25) corresponds to the hyperdense lesion described yesterday and appears stable. Minimal to mild associated mass effect with no definite cerebral edema. No other abnormal intracranial enhancement. IMPRESSION: 1. Negative for emergent large vessel occlusion. 2. Extensive atherosclerosis, but no anterior circulation hemodynamically significant stenosis. There are mild bilateral ICA siphon stenoses. 3. Up to moderate stenoses of the bilateral vertebral arteries and left PCA P2 segment in the posterior circulation. 4. No acute or evolving cortically based infarct identified. Advanced white matter signal changes compatible with chronic small vessel disease. 5. Intubated. Small volume retained secretions in the trachea. Partially visible patchy and nodular left upper lung opacity is new since 05/07/2016 compatible with infectious or inflammatory process there is an associated moderate size layering left pleural effusion. Underlying emphysema. 6. Parafalcine 16 mm meningioma at the level of the cingulate gyri with no significant mass effect and no definite cerebral edema. Electronically Signed   By: Genevie Ann M.D.   On: 06/09/2016 15:54   Dg Chest Portable 1 View  06/11/2016  CLINICAL DATA:  Acute respiratory failure, shortness of breath. EXAM: PORTABLE CHEST 1 VIEW COMPARISON:  Chest x-rays dated 06/10/2016 in 05/2012 2017. FINDINGS: Endotracheal tube is well positioned with tip just above the level of the carina. Enteric tube passes below the diaphragm. Left chest wall pacemaker/ ICD appears stable in position. Cardiomediastinal silhouette is stable in size and configuration. Lungs appear stable compared to yesterday's chest x-ray. There  is improved aeration at the left lung base compared to the exam of  06/09/2016. Mild central pulmonary vascular congestion persists. Lungs are again hyperexpanded suggesting COPD. Probable associated chronic bronchitic changes centrally. No pleural effusion or pneumothorax seen. IMPRESSION: Lungs appear stable compared to yesterday's chest x-ray. Persistent mild central pulmonary vascular congestion. Hyperexpanded lungs suggesting COPD. Suspect associated chronic bronchitic changes centrally. Endotracheal tube well positioned with tip just above the level of the carina. Electronically Signed   By: Franki Cabot M.D.   On: 06/11/2016 07:40   Dg Chest Port 1 View  06/10/2016  CLINICAL DATA:  Hypoxia EXAM: PORTABLE CHEST 1 VIEW COMPARISON:  June 09, 2016 FINDINGS: Endotracheal tube tip is 3.2 cm above the carina. Nasogastric tube tip and side port are in the stomach. Pacemaker leads are attached to the right atrium and right ventricle. There is no demonstrable pneumothorax. There has been interval clearing of infiltrate from the left base with only mild left base atelectasis remaining. There is mild perihilar interstitial edema, slightly less compared to 1 day prior. No new opacity. Lungs are hyperexpanded. Heart size and pulmonary vascularity are within normal limits. No adenopathy evident. IMPRESSION: Evidence of clearing of infiltrate from left base as well as partial but incomplete clearing of perihilar interstitial edema. There is residual atelectasis in the left base and mild perihilar interstitial edema currently. No new opacity. No change in cardiac silhouette. Tube positions as described without pneumothorax. Electronically Signed   By: Lowella Grip III M.D.   On: 06/10/2016 07:04   Dg Abd Portable 1v  06/09/2016  CLINICAL DATA:  Nasogastric tube placement. EXAM: PORTABLE ABDOMEN - 1 VIEW COMPARISON:  CT 05/07/16 FINDINGS: Nasogastric tube tip is at the level of the stomach. Epigastric sutures and clips related to previous stomach surgery. Contrast in the urinary  collecting system related to recent CTA. Lung bases are grossly clear. Nonobstructive bowel gas pattern. Lumbar scoliosis. IMPRESSION: Nasogastric tube with tip at the stomach. Electronically Signed   By: Monte Fantasia M.D.   On: 06/09/2016 18:54   2D Echocardiogram  - Left ventricle: The cavity size was mildly dilated. Wall thickness was increased in a pattern of mild LVH. Systolic function was normal. The estimated ejection fraction was in the range of 50% to 55%. The study is not technically sufficient to allow evaluation of LV diastolic function. - Aortic valve: Calcified non coronary cusp. - Mitral valve: There was mild regurgitation. - Left atrium: The atrium was mildly dilated. - Right atrium: The atrium was mildly dilated. - Atrial septum: No defect or patent foramen ovale was identified. - Pericardium, extracardiac: Small localized posterior lateral effusion CT angio brain and neck 06/09/2016 ; 1. Negative for emergent large vessel occlusion. 2. Extensive atherosclerosis, but no anterior circulation hemodynamically significant stenosis. There are mild bilateral ICA siphon stenoses. 3. Up to moderate stenoses of the bilateral vertebral arteries and left PCA P2 segment in the posterior circulation  PHYSICAL EXAM Frail elderly caucasian lady not in distress.intubated on ventilator. . Afebrile. Head is nontraumatic. Neck is supple without bruit.    Cardiac exam no murmur or gallop. Lungs are clear to auscultation. Distal pulses are well felt. Neurological Exam : Awake and alert. Intubated hence higher function testing is limited. Follows commands quite consistently. Eyes are open. Pupils equal reactive. Fundi were not visualized. Blinks to threat bilaterally. Face is symmetric without weakness. Tongue is midline. Motor system exam reveals no upper or lower extremity drift. Mild weakness of left grip and  intrinsic hand muscles. Symmetric lower extremity strength. Reflexes 2+ symmetric.  Plantars downgoing. Sensation intact bilaterally. Gait was not tested. ASSESSMENT/PLAN Lydia [name] is a 82 y.o. female with history of severe COPD, peripheral vascular disease, recent 45 # weight loss, who presented  To [facility] with AF w/ RVR, was transferred to [facility], who post pacemaker placement  became unresponsive and developed L hemiparesis. She did not receive IV t-PA .   Stroke:  Non-dominant right brain infarct embolic secondary to atrial fib  Resultant  Mild left sided weakness  CT falx meningioma  CTA head pending  CTA neck  pending  2D Echo  EF 50-55%. No source of embolus   LDL 83  HgbA1c 5.4  SCDs for VTE prophylaxis Diet NPO time specified  No antithrombotic prior to admission, now on aspirin 300 mg suppository daily  Patient counseled to be compliant with her antithrombotic medications  Ongoing aggressive stroke risk factor management  Therapy recommendations: pending  Disposition:  pending  Atrial Fibrillation w/ RVR, new onset Sick Sinus Syndrome  Home anticoagulation:  No antithrombotic   On IV heparin until 06/08/2016 at 0300  Continue Aspirin now but will need NOAC at discharge    Hypertension  Stable   Permissive hypertension (OK if < 220/120) but gradually normalize in 5-7 days  Long-term BP goal normotensive  Hyperlipidemia  Home meds:  No statin  LDL 83, goal < 70  Add statin -lipitor 10 mg  Continue statin at discharge  Other Stroke Risk Factors  Advanced age  Cigarette smoker, advised to stop smoking  Coronary artery disease in aorta and coronaries, seen on CT  PAD  Mesenteric ischemia and PVD  Other Active Problems  Unexplained weight loss  COPD  Polycythemia   Malnutrition, Body mass index is 13.81 kg/(m^2).   Hospital day # 3  I have personally examined this patient, reviewed notes, independently viewed imaging studies, participated in medical decision making and plan of care. I have made any  additions or clarifications directly to the above note. Agree with note above.  Hopefully extubate today.  Continue iv heparin for now and when patient is able to swallow consider long-term anticoagulation with Eliquis. I had a long discussion at the bedside with the patient, . Greater than 50% time during this 25 minute  visit was spent on counseling and coordination of care about stroke risk and prevention.  Antony Contras, MD Medical Director [facility] Pager: [phone] 06/11/2016 10:46 AM     To contact Stroke Continuity provider, please refer to [email or web address] After hours, contact General Neurology

## 2016-06-11 NOTE — Progress Notes (Signed)
[place] for Heparin Indication: Afib and CVA  Allergies  Allergen Reactions  . Penicillins Hives    Has patient had a PCN reaction causing immediate rash, facial/tongue/throat swelling, SOB or lightheadedness with hypotension: Yes Has patient had a PCN reaction causing severe rash involving mucus membranes or skin necrosis: No Has patient had a PCN reaction that required hospitalization No Has patient had a PCN reaction occurring within the last 10 years. No If all of the above answers are "NO", then may proceed with Cephalosporin use.    Patient Measurements: Height: 5\' 6"  (167.6 cm) Weight: 85 lb 8.6 oz (38.8 kg) IBW/kg (Calculated) : 59.3 Heparin Dosing Weight: 41 kg  Vital Signs: Temp: 97.8 F (36.6 C) (06/15 1548) Temp Source: Oral (06/15 1548) BP: 149/72 mmHg (06/15 1800) Pulse Rate: 58 (06/15 1700)  Labs:  Recent Labs  06/09/16 0206 06/10/16 0205  06/10/16 2109 06/11/16 0457 06/11/16 1026 06/11/16 1905  HGB 12.2 11.8*  --   --  12.0  --   --   HCT 39.5 36.3  --   --  38.4  --   --   PLT 205 190  --   --  236  --   --   HEPARINUNFRC  --  <0.10*  < > 0.14*  --  0.32 0.28*  CREATININE 0.99 1.01*  --  1.01* 0.83 0.84  --   < > = values in this interval not displayed.  Estimated Creatinine Clearance: 29.4 mL/min (by C-G formula based on Cr of 0.84).   Medical History: Past Medical History  Diagnosis Date  . Essential hypertension   . Osteoporosis   . Lower back pain   . History of stomach ulcers     a. s/p surgery ~ 40 yrs ago.  . Polycythemia     a. phlebotomy.  Marland Kitchen COPD (chronic obstructive pulmonary disease) ([place])   . IDA (iron deficiency anemia)     a. 04/2016 CT Abd: no malignancy.  . Weight loss, unintentional   . Thyroid nodule     a. 04/2016 large left thyroid nodule on CT chest  - rec dedicated u/s.  . Pulmonary nodule, left     a. 04/2016 CT chest: 66mm LUL nodule - f/u CT in 12 months (h/o smoking).  Marland Kitchen PAD  (peripheral artery disease) ([place])     a. 04/2016 CT Abd: occluded prox LSFA.  . Superior mesenteric artery stenosis ([place])     a. 04/2016 CT Abd: atheromatous plaque and mural thrombus in SMA.  . Tobacco abuse   . New onset atrial flutter ([place]) 05/2016  . Tachy-brady syndrome ([place]) 05/2016    a. to [facility] for PPM placement    Assessment: 80 [place] who presented to [place] on 6/9 with new onset Afib with RVR. The patient converted briefly to NSR with medications though this was not sustainable. On 6/12 the patient was transferred to [facility] for PPM placement. Post placement, the patient was noted to have left sided weakness and a right gaze - code stroke was called. Neuro was consulted - the patient is noted to have a R-CVA thought to be d/t Afib. Repeat CT head/neck showed small R brain infarct.  Anticoagulation: Afib/CVA. Heparin level 0.28 this PM.  Goal of Therapy:  Heparin level 0.3-0.5 units/ml Monitor platelets by anticoagulation protocol: Yes    Plan:  -Increase heparin to 1050 units/hr -Daily HL, CBC   Vestal Markin S. Alford Highland, PharmD, [place] Clinical Staff Pharmacist Pager [phone]  06/11/2016 8:14 PM

## 2016-06-11 NOTE — Care Management Important Message (Signed)
Important Message  Patient Details  Name: Lydia [name] MRN: [id number] Date of Birth: Sep 18, 1927   Medicare Important Message Given:  Yes    Loann Quill 06/11/2016, 9:29 AM

## 2016-06-11 NOTE — Progress Notes (Signed)
Gave patient small sips of water and patient tolerated swallowing well.  Patient able to swallow po medication with applesauce.  Will continue to monitor. [place], Ardeth Sportsman

## 2016-06-11 NOTE — Procedures (Signed)
Extubation Procedure Note  Patient Details:   Name: Lydia [name] DOB: March 24, 1927 MRN: [id number]   Airway Documentation:     Evaluation  O2 sats: stable throughout Complications: No apparent complications Patient did tolerate procedure well. Bilateral Breath Sounds: Diminished, Rhonchi   Yes  Patient was extubated to 3L [place]. Patient was able to speak. Sats are stable RT to monitor.  Maxamus Colao, Leonie Douglas 06/11/2016, 12:37 PM

## 2016-06-11 NOTE — Progress Notes (Signed)
[place] for Heparin Indication: Afib and CVA  Allergies  Allergen Reactions  . Penicillins Hives    Has patient had a PCN reaction causing immediate rash, facial/tongue/throat swelling, SOB or lightheadedness with hypotension: Yes Has patient had a PCN reaction causing severe rash involving mucus membranes or skin necrosis: No Has patient had a PCN reaction that required hospitalization No Has patient had a PCN reaction occurring within the last 10 years. No If all of the above answers are "NO", then may proceed with Cephalosporin use.    Patient Measurements: Height: 5\' 6"  (167.6 cm) Weight: 85 lb 8.6 oz (38.8 kg) IBW/kg (Calculated) : 59.3 Heparin Dosing Weight: 41 kg  Vital Signs: Temp: 97.6 F (36.4 C) (06/15 0800) Temp Source: Oral (06/15 0800) BP: 139/69 mmHg (06/15 1000) Pulse Rate: 64 (06/15 1000)  Labs:  Recent Labs  06/08/16 1826  06/09/16 0206  06/10/16 0205 06/10/16 1108 06/10/16 2109 06/11/16 0457 06/11/16 1026  HGB  --   < > 12.2  --  11.8*  --   --  12.0  --   HCT  --   < > 39.5  --  36.3  --   --  38.4  --   PLT  --   < > 205  --  190  --   --  236  --   APTT 33  --   --   --   --   --   --   --   --   LABPROT 14.8  --   --   --   --   --   --   --   --   INR 1.14  --   --   --   --   --   --   --   --   HEPARINUNFRC  --   --   --   < > <0.10* <0.10* 0.14*  --  0.32  CREATININE  --   < > 0.99  --  1.01*  --  1.01* 0.83 0.84  < > = values in this interval not displayed.  Estimated Creatinine Clearance: 29.4 mL/min (by C-G formula based on Cr of 0.84).   Medical History: Past Medical History  Diagnosis Date  . Essential hypertension   . Osteoporosis   . Lower back pain   . History of stomach ulcers     a. s/p surgery ~ 40 yrs ago.  . Polycythemia     a. phlebotomy.  Marland Kitchen COPD (chronic obstructive pulmonary disease) ([place])   . IDA (iron deficiency anemia)     a. 04/2016 CT Abd: no malignancy.  . Weight  loss, unintentional   . Thyroid nodule     a. 04/2016 large left thyroid nodule on CT chest  - rec dedicated u/s.  . Pulmonary nodule, left     a. 04/2016 CT chest: 58mm LUL nodule - f/u CT in 12 months (h/o smoking).  Marland Kitchen PAD (peripheral artery disease) ([place])     a. 04/2016 CT Abd: occluded prox LSFA.  . Superior mesenteric artery stenosis ([place])     a. 04/2016 CT Abd: atheromatous plaque and mural thrombus in SMA.  . Tobacco abuse   . New onset atrial flutter ([place]) 05/2016  . Tachy-brady syndrome ([place]) 05/2016    a. to [facility] for PPM placement    Assessment: 84 [place] who presented to [place] on 6/9 with new onset Afib with RVR. The patient converted briefly  to NSR with medications though this was not sustainable. On 6/12 the patient was transferred to [facility] for PPM placement. Post placement, the patient was noted to have left sided weakness and a right gaze - code stroke was called. Neuro was consulted - the patient is noted to have a R-CVA thought to be d/t Afib. Repeat CT head/neck showed small R brain infarct. Overnight the RN noted bleeding from the IV site, heparin was held for 4 hours and then resumed. Heparin level is therapeutic on the low end this morning. CBC stable and wnl.    Goal of Therapy:  Heparin level 0.3-0.5 units/ml Monitor platelets by anticoagulation protocol: Yes    Plan:  -Continue heparin at 1000 units/hr -Check confirmatory level this afternoon -Daily HL, CBC   Hughes Better, PharmD, BCPS Clinical Pharmacist Pager: [phone] 06/11/2016 11:49 AM

## 2016-06-11 NOTE — Progress Notes (Signed)
Pt CBG 167. MD notified and sliding scale initiated q 4.

## 2016-06-11 NOTE — Progress Notes (Signed)
SUBJECTIVE:  The patient is awake, writes on the board/paper to communicate, denies pain, wants tube out.    Marland Kitchen amLODipine  5 mg Oral Daily  . antiseptic oral rinse  7 mL Mouth Rinse 10 times per day  . aspirin  300 mg Rectal Daily  . atorvastatin  10 mg Oral q1800  . budesonide (PULMICORT) nebulizer solution  0.5 mg Nebulization BID  . chlorhexidine gluconate (SAGE KIT)  15 mL Mouth Rinse BID  . feeding supplement (PRO-STAT SUGAR FREE 64)  30 mL Per Tube Daily  . feeding supplement (VITAL AF 1.2 CAL)  1,000 mL Per Tube Q24H  . insulin aspart  0-15 Units Subcutaneous Q4H  . ipratropium-albuterol  3 mL Nebulization Q6H  . levofloxacin (LEVAQUIN) IV  500 mg Intravenous Q48H  . methylPREDNISolone (SOLU-MEDROL) injection  20 mg Intravenous Q12H  . multivitamin  15 mL Per Tube Daily  . pantoprazole sodium  40 mg Per Tube Q24H  . potassium chloride  10 mEq Intravenous Q1 Hr x 4  . potassium chloride  40 mEq Per Tube BID   . amiodarone 30 mg/hr (06/10/16 2126)  . heparin 1,000 Units/hr (06/11/16 0254)    OBJECTIVE: Physical Exam: Filed Vitals:   06/11/16 0353 06/11/16 0400 06/11/16 0500 06/11/16 0600  BP:  140/65 146/94 136/67  Pulse:  68 66 59  Temp:  97.7 F (36.5 C)    TempSrc:  Oral    Resp:  17 20 16   Height:      Weight:   85 lb 8.6 oz (38.8 kg)   SpO2: 100% 100% 100% 100%    Intake/Output Summary (Last 24 hours) at 06/11/16 0801 Last data filed at 06/11/16 3845  Gross per 24 hour  Intake 1393.02 ml  Output    930 ml  Net 463.02 ml    Telemetry reveals SR this morning, PAF yesterday with occ pacing   GEN- The patient is intubated, alert and oriented, nods/shakes head yes/no, follows directions   Head- normocephalic, atraumatic Eyes-  Sclera clear, conjunctiva pink Ears- hearing intact Oropharynx- clear Neck- supple, no JVP Lungs- Clear to ausculation bilaterally,not wheezing Heart- RRR, no significant murmurs, no rubs or gallops GI- soft, NT,  ND Extremities- no clubbing, cyanosis, or edema Skin- no rash or lesion Psych- euthymic mood, full affect Neuro- moving all extremities this morning, following directions  PPM site is stable, dressing is dry, no hematoma   LABS: Basic Metabolic Panel:  Recent Labs  06/10/16 1625 06/10/16 2109 06/11/16 0457  NA  --  134* 136  K  --  2.9* 4.7  CL  --  97* 98*  CO2  --  28 28  GLUCOSE  --  137* 168*  BUN  --  29* 28*  CREATININE  --  1.01* 0.83  CALCIUM  --  9.1 9.2  MG 1.7 1.8 1.9  PHOS 4.9*  --  3.9   Liver Function Tests:  Recent Labs  06/08/16 2052  AST 54*  ALT 60*  ALKPHOS 112  BILITOT 1.1  PROT 5.8*  ALBUMIN 2.9*   CBC:  Recent Labs  06/08/16 2052  06/10/16 0205 06/11/16 0457  WBC 22.8*  < > 11.0* 12.4*  NEUTROABS 21.4*  --  10.5*  --   HGB 13.7  < > 11.8* 12.0  HCT 43.1  < > 36.3 38.4  MCV 85.9  < > 83.4 83.1  PLT 237  < > 190 236  < > = values in this  interval not displayed. Fasting Lipid Panel:  Recent Labs  06/08/16 2052  CHOL 162  HDL 61  LDLCALC 83  TRIG 92  CHOLHDL 2.7    RADIOLOGY: 06/09/16 CT angio head IMPRESSION: 1. Negative for emergent large vessel occlusion. 2. Extensive atherosclerosis, but no anterior circulation hemodynamically significant stenosis. There are mild bilateral ICA siphon stenoses. 3. Up to moderate stenoses of the bilateral vertebral arteries and left PCA P2 segment in the posterior circulation. 4. No acute or evolving cortically based infarct identified. Advanced white matter signal changes compatible with chronic small vessel disease. 5. Intubated. Small volume retained secretions in the trachea. Partially visible patchy and nodular left upper lung opacity is new since 05/07/2016 compatible with infectious or inflammatory process there is an associated moderate size layering left pleural effusion. Underlying emphysema. 6. Parafalcine 16 mm meningioma at the level of the cingulate gyri with no  significant mass effect and no definite cerebral edema.  Dg Chest Port 1 View 06/11/16 IMPRESSION: Lungs appear stable compared to yesterday's chest x-ray. Persistent mild central pulmonary vascular congestion.  Hyperexpanded lungs suggesting COPD. Suspect associated chronic bronchitic changes centrally.  Endotracheal tube well positioned with tip just above the level of the carina.  06/08/2016  CLINICAL DATA:  Unresponsive with left-sided weakness following pacemaker placement. EXAM: PORTABLE CHEST 1 VIEW COMPARISON:  Radiographs 04/04/2010.  CT 05/07/2016. FINDINGS: 1727 hours. Left subclavian pacemaker leads project over the right atrium and right ventricle. The heart size and mediastinal contours are stable allowing for patient rotation to the right. There is diffuse aortic atherosclerosis. There is vascular congestion with new asymmetric perihilar and lower lobe airspace disease on the left. No pneumothorax or significant pleural effusion identified. The bones appear unchanged. IMPRESSION: 1. The left subclavian pacemaker leads appear satisfactorily positioned in the right atrium and right ventricle. No pneumothorax. 2. New vascular congestion with asymmetric airspace disease on the left consistent with asymmetric edema or aspiration. Electronically Signed   By: Richardean Sale M.D.   On: 06/08/2016 17:44    ASSESSMENT AND PLAN:   1. Tachy-brady syndrome, transferred yesterday from [facility] for PPM      S/p PPM 06/08/16 with Dr. Lovena Le      Intact device post implant 06/09/16      cxr immediate post procedure and 06/09/16 without ptx      PPM site stable, dressing is dry, no hematoma       2. Intra-post PPM procedure respiratory depression/change in MS/L sided defecit     Appreciate neuro     Initial CT negative for acute changes, CT angio noted above     Suspect suffered right brain associated with atrial fibrillation     Started on ASA per neuro     BP peramaters as per neuro (140-160  SBP)     Neuro-wise she remains much improved this morning, await further neurology eval and recommendations today  3. PAFib (new)     On heparin gtt initially, held for her PPM implant     Heparin gtt re-started 06/09/16, management with pharmacy , H/H plts are stable     Neuro note to consider long term a/c when able to take PO     continue amio gtt for now, she is currently in SR.   4. COPD/emphysema     Intubuated, unable to wean so far     Management with CCM, appreciate help and input     ?aspiration vs edema     Patient requests tube out,  defer to CCM     She is a longstanding heavy smoker until the day she went into the hospital  5. PVD     Recent hx of 50 lbs weight loss, anorexia, with mesenteric ischemia.subtotal occ of SMA, occ L SFA  6. HTN     BP stable     Permissive BP as per neuro parameters  7. Hypokalemia     Replaced early this morning with CCM, better  8. Leukocytosis     procalcitonin 0.23 >> 0.16  today     Will defer to CCM, ?aspiration, on levaquin         Tommye Standard, PA-C 06/11/2016 8:01 AM  She is doing well and hopefully will be able to be extrubatd

## 2016-06-11 NOTE — Plan of Care (Signed)
Problem: Skin Integrity: Goal: Risk for impaired skin integrity will decrease Outcome: Not Progressing Multiple skin tears and erythema on sacrum.  Problem: Nutrition: Goal: Adequate nutrition will be maintained Outcome: Progressing Tube feeding started  Problem: Bowel/Gastric: Goal: Will not experience complications related to bowel motility Outcome: Not Progressing No BM since admission

## 2016-06-11 NOTE — Progress Notes (Signed)
PULMONARY / CRITICAL CARE MEDICINE   Name: Lydia [name] MRN: [id number] DOB: 02/17/1927    ADMISSION DATE:  06/08/2016 CONSULTATION DATE:  6/12  REFERRING MD:  Dr. Lovena Le EP  CHIEF COMPLAINT:  Unresponsive s/p pacemaker placement  HISTORY OF PRESENT ILLNESS:   82yo female with HTN, COPD, tachy-brady syndrome having a pacemaker placed 6/12 when she became unresponsive with L sided weaness.  Code stroke called and pt intubated and tx ICU.  Mental status improving.   SUBJECTIVE:  Not tolerating 8/5 too well. No complaints except for being on ventilator  VITAL SIGNS: BP 139/69 mmHg  Pulse 64  Temp(Src) 97.6 F (36.4 C) (Oral)  Resp 17  Ht 5\' 6"  (1.676 m)  Wt 38.8 kg (85 lb 8.6 oz)  BMI 13.81 kg/m2  SpO2 100%  HEMODYNAMICS:    VENTILATOR SETTINGS: Vent Mode:  [-] PRVC FiO2 (%):  [30 %] 30 % Set Rate:  [16 bmp] 16 bmp Vt Set:  [70 mL-470 mL] 470 mL PEEP:  [5 cmH20] 5 cmH20 Plateau Pressure:  [13 cmH20-18 cmH20] 17 cmH20  INTAKE / OUTPUT: I/O last 3 completed shifts: In: 1733.8 [I.V.:824.1; NG/GT:609.8; IV Piggyback:300] Out: 1365 [Urine:1365]  PHYSICAL EXAMINATION: General:  Frail, cachetic, elderly female, NAD  Neuro: Awake and alert. Follows commands, nods appropriately, MAE, mild R sided weakness  HEENT: PERRL, mucous membranes dry Cardiovascular:  Afib/Aflutter with rate controlled. No JVD, No murmurs Lungs:  Diminished throughout, few scattered wheezes  Abdomen: flat, + BS,  Musculoskeletal: moving all extremities to command,slightly weaker on right.  LABS:  BMET  Recent Labs Lab 06/10/16 0205 06/10/16 2109 06/11/16 0457  NA 135 134* 136  K 3.6 2.9* 4.7  CL 99* 97* 98*  CO2 25 28 28   BUN 24* 29* 28*  CREATININE 1.01* 1.01* 0.83  GLUCOSE 114* 137* 168*    Electrolytes  Recent Labs Lab 06/10/16 0205 06/10/16 1109 06/10/16 1625 06/10/16 2109 06/11/16 0457  CALCIUM 8.7*  --   --  9.1 9.2  MG  --  1.7 1.7 1.8 1.9  PHOS  --  5.4* 4.9*  --   3.9    CBC  Recent Labs Lab 06/09/16 0206 06/10/16 0205 06/11/16 0457  WBC 15.8* 11.0* 12.4*  HGB 12.2 11.8* 12.0  HCT 39.5 36.3 38.4  PLT 205 190 236    Coag's  Recent Labs Lab 06/05/16 1856 06/08/16 1826  APTT 29 33  INR 0.94 1.14    Sepsis Markers  Recent Labs Lab 06/08/16 2011 06/09/16 1133 06/09/16 1510 06/10/16 0205 06/11/16 0457  LATICACIDVEN 2.1* 1.5 1.4  --   --   PROCALCITON  --  0.43  --  0.23 0.16    ABG  Recent Labs Lab 06/08/16 2031 06/10/16 0410  PHART 7.254* 7.487*  PCO2ART 63.9* 34.1*  PO2ART 478.0* 103.0*    Liver Enzymes  Recent Labs Lab 06/08/16 2052  AST 54*  ALT 60*  ALKPHOS 112  BILITOT 1.1  ALBUMIN 2.9*    Cardiac Enzymes No results for input(s): TROPONINI, PROBNP in the last 168 hours.  Glucose  Recent Labs Lab 06/10/16 1150 06/10/16 1531 06/10/16 2024 06/11/16 0045 06/11/16 0501 06/11/16 0808  GLUCAP 134* 147* 152* 150* 167* 142*    Imaging Dg Chest Portable 1 View  06/11/2016  CLINICAL DATA:  Acute respiratory failure, shortness of breath. EXAM: PORTABLE CHEST 1 VIEW COMPARISON:  Chest x-rays dated 06/10/2016 in 05/2012 2017. FINDINGS: Endotracheal tube is well positioned with tip just above the level of  the carina. Enteric tube passes below the diaphragm. Left chest wall pacemaker/ ICD appears stable in position. Cardiomediastinal silhouette is stable in size and configuration. Lungs appear stable compared to yesterday's chest x-ray. There is improved aeration at the left lung base compared to the exam of 06/09/2016. Mild central pulmonary vascular congestion persists. Lungs are again hyperexpanded suggesting COPD. Probable associated chronic bronchitic changes centrally. No pleural effusion or pneumothorax seen. IMPRESSION: Lungs appear stable compared to yesterday's chest x-ray. Persistent mild central pulmonary vascular congestion. Hyperexpanded lungs suggesting COPD. Suspect associated chronic bronchitic  changes centrally. Endotracheal tube well positioned with tip just above the level of the carina. Electronically Signed   By: Franki Cabot M.D.   On: 06/11/2016 07:40     STUDIES: 6/13: CXR>>Right perihilar and left lower lobe infiltrates are present. 6/12: CT Brain>> Hyperdense lesion involving the falx near the vertex representing meningioma, 18 mm 6/13: CTA Brain >  Negative for emergent large vessel occlusion. xtensive atherosclerosis, but no anterior circulation hemodynamically significant stenosis. There are mild bilateral ICA siphon stenoses. Up to moderate stenoses of the bilateral vertebral arteries and left PCA P2 segment in the posterior circulation. No acute or evolving cortically based infarct identified. Advanced white matter signal changes compatible with chronic small vessel disease. Partially visible patchy and nodular left upper lung opacity there is an associated moderate size layering left pleural effusion. Underlying emphysema. Parafalcine 16 mm meningioma at the level of the cingulate gyri with no significant mass effect and no definite cerebral edema.  CULTURES: 6/12: Sputum Culture>>few GNR, few GPC>>>   ANTIBIOTICS: 6/12: Levaquin>>> 6/12 Vanc>>>6/15  SIGNIFICANT EVENTS: 6/12>> Pace Maker Placement 6/12>> Intubated Post Pace Placement 2/2 ? Stroke vs TIA  LINES/TUBES: 6/12: PIV 6/12: ETT 6/12: Foley  DISCUSSION: Admitted to [place] after becoming unresponsive after pacemaker placement. Code stroke initiated due to patient not moving left side.  She was intubated on arrival to [facility]. CT/CTA completed. Neurology consulted.CT head shows meningioma, but 6/13  she is awake and alert, moving all extremities and following commands.    ASSESSMENT / PLAN:  PULMONARY A: Acute Respiratory Failure - r/t AMS, AECOPD, ?aspiration PNA  COPD  ?PNA - suspect aspiration  Small L effusion on CT P:   Aggressive attempts at PS wean  Pulmonary hygiene  Continue BD - duoneb,  budesonide  Add brovana for optimization in attempts to extubate. Continue Solumedrol for AECOPD   CARDIOVASCULAR A:  Hypertension Afib/Aflutter Tachy-brady syndrome s/p pacemaker  P:  Cardiology/EP primary Continue Amiodarone gtt  Maintain SBP< 180 Heparin gtt per pharmacy   RENAL A:   Hypokalemia P:   Gentle volume  Replete K PRN  F/u chem   GASTROINTESTINAL A:   Protein calorie malnutrition  Weight Loss - Recent 50 Lb weight loss (Maybe 2/2 COPD cachexia) P:   Continue TF per nutrition  Monitor for refeeding syndrome  Consult nutrition for recommendations  HEMATOLOGIC A:   Iron Deficiency Anemia Polycythemia P:  Followed by oncology Heparin gtt per cards  F/u CBC   INFECTIOUS A:   Suspected  Aspiration  P:    levaquin  DC vanc as NGTD on cultures and PCT neg F/u cultures  Trend pct   ENDOCRINE A:   No active issue  P:   Monitor glucose on chem   NEUROLOGIC A:   Stroke - r/t AFib  Incidental finding of Meningioma 6/12 P:   RASS goal: 1 Neurology following  Supportive care  Heparin gtt as above  FAMILY  - Updates: Husband  And daughter updated at bedside 6/14  - Inter-disciplinary family meet or Palliative Care meeting due by: 06/15/16  Critical care time by APP 30 mins  Georgann Housekeeper, AGACNP-BC [facility] Pulmonology/Critical Care Pager [phone] or [phone]  06/11/2016 10:48 AM   She was able to tolerate SBT reasonable well >> increased RR, but maintained Vt.    No wheeze, HR irregular, follows commands, abdomen soft.  CXR with decreased ASD  WBC 12.4, hb 12, Creatinine 0.84.  Assessment/plan:  Acute respiratory failure, hypoxia/hypercapnia. AECOPD. Aspiration pneumonia. - extubated >> she would be okay with reintubation if needed - Bipap prn - pulmicort, brovana, solumedrol - day 4 of levaquin  A fib. Tachy-brady s/p PM. - per cardiology  Updated pt's family at bedside.  CC time by me independent of APP  time 33 minutes.  Chesley Mires, MD [facility] Pulmonary/Critical Care 06/11/2016, 1:20 PM Pager:  [phone] After 3pm call: [phone]

## 2016-06-11 NOTE — Clinical Documentation Improvement (Signed)
Critical Care  Can the diagnosis of Malnutrition be further specified?   Document Severity - Severe(third degree), Moderate (second degree), Mild (first degree)  Other condition  Unable to clinically determine  Document any associated diagnoses/conditions Supporting Information: :  06/10/2016 Nutritional Management  >Malnutrition (Severe) related to chronic illness (COPD) as evidenced by severe depletion of body fat, severe depletion of muscle mass.   Please exercise your independent, professional judgment when responding. A specific answer is not anticipated or expected. Please update your documentation within the medical record to reflect your response to this query. Thank you  Thank You, [place] [phone]

## 2016-06-12 LAB — BASIC METABOLIC PANEL
Anion gap: 7 (ref 5–15)
BUN: 30 mg/dL — AB (ref 6–20)
CO2: 27 mmol/L (ref 22–32)
Calcium: 9.1 mg/dL (ref 8.9–10.3)
Chloride: 102 mmol/L (ref 101–111)
Creatinine, Ser: 0.73 mg/dL (ref 0.44–1.00)
GFR calc Af Amer: >60 mL/min (ref >60)
GLUCOSE: 124 mg/dL — AB (ref 65–99)
POTASSIUM: 4.2 mmol/L (ref 3.5–5.1)
Sodium: 136 mmol/L (ref 135–145)

## 2016-06-12 LAB — CBC
HEMATOCRIT: 39.2 % (ref 36.0–46.0)
Hemoglobin: 12.4 g/dL (ref 12.0–15.0)
MCH: 27.1 pg (ref 26.0–34.0)
MCHC: 31.6 g/dL (ref 30.0–36.0)
MCV: 85.6 fL (ref 78.0–100.0)
Platelets: 218 10*3/uL (ref 150–400)
RBC: 4.58 MIL/uL (ref 3.87–5.11)
WBC: 12.5 10*3/uL — ABNORMAL HIGH (ref 4.0–10.5)

## 2016-06-12 LAB — HEPARIN LEVEL (UNFRACTIONATED): Heparin Unfractionated: 0.75 IU/mL — ABNORMAL HIGH (ref 0.30–0.70)

## 2016-06-12 LAB — MAGNESIUM: MAGNESIUM: 1.9 mg/dL (ref 1.7–2.4)

## 2016-06-12 LAB — PHOSPHORUS: Phosphorus: 3.7 mg/dL (ref 2.5–4.6)

## 2016-06-12 MED ORDER — IPRATROPIUM-ALBUTEROL 0.5-2.5 (3) MG/3ML IN SOLN: 3.0000 mL | Freq: Four times a day (QID) | RESPIRATORY_TRACT | Status: DC | PRN

## 2016-06-12 MED ORDER — POLYETHYLENE GLYCOL 3350 17 G PO PACK
17.0000 g | PACK | Freq: Once | ORAL | Status: AC
  Administered 2016-06-12: 17 g via ORAL
  Filled 2016-06-12: qty 1

## 2016-06-12 MED ORDER — ASPIRIN EC 81 MG PO TBEC
81.0000 mg | DELAYED_RELEASE_TABLET | Freq: Every day | ORAL | Status: DC
  Administered 2016-06-12 – 2016-06-13 (×2): 81 mg via ORAL
  Filled 2016-06-12 (×2): qty 1

## 2016-06-12 MED ORDER — PREDNISONE 20 MG PO TABS
30.0000 mg | ORAL_TABLET | Freq: Every day | ORAL | Status: DC
  Administered 2016-06-12 – 2016-06-15 (×4): 30 mg via ORAL
  Filled 2016-06-12 (×5): qty 1

## 2016-06-12 MED ORDER — ENSURE ENLIVE PO LIQD
237.0000 mL | Freq: Two times a day (BID) | ORAL | Status: DC
  Administered 2016-06-13 – 2016-06-16 (×5): 237 mL via ORAL

## 2016-06-12 MED ORDER — AMIODARONE HCL 200 MG PO TABS
400.0000 mg | ORAL_TABLET | Freq: Two times a day (BID) | ORAL | Status: DC
  Administered 2016-06-12 – 2016-06-16 (×9): 400 mg via ORAL
  Filled 2016-06-12 (×11): qty 2

## 2016-06-12 MED ORDER — APIXABAN 2.5 MG PO TABS
2.5000 mg | ORAL_TABLET | Freq: Two times a day (BID) | ORAL | Status: DC
  Administered 2016-06-12 – 2016-06-16 (×9): 2.5 mg via ORAL
  Filled 2016-06-12 (×9): qty 1

## 2016-06-12 MED ORDER — LEVOFLOXACIN 500 MG PO TABS
500.0000 mg | ORAL_TABLET | ORAL | Status: AC
  Administered 2016-06-12 – 2016-06-14 (×2): 500 mg via ORAL
  Filled 2016-06-12 (×3): qty 1

## 2016-06-12 NOTE — Evaluation (Signed)
Physical Therapy Evaluation Patient Details Name: Lydia [name] MRN: [id number] DOB: 01-Aug-1927 Today's Date: 06/12/2016   History of Present Illness  pt is an 82 y/o female with h/o FTN, COPD, tachy-brady synd, s/p pacer placement 6/12 admitted after becoming unresponsive after procedure with L sided weakness.  Code stroke called, pt intubated and tx to ICU.  Now improving with less residual L sided weakness.  Clinical Impression  Pt admitted with/for unresposiveness post pacer placement with assoc L sided weakness.  Pt currently limited functionally due to the problems listed below.  (see problems list.)  Pt will benefit from PT to maximize function and safety to be able to get home safely with available assist of family.     Follow Up Recommendations Home health PT;Supervision for mobility/OOB    Equipment Recommendations  Rolling walker with 5" wheels    Recommendations for Other Services       Precautions / Restrictions Precautions Precautions: Fall Restrictions Weight Bearing Restrictions: No      Mobility  Bed Mobility Overal bed mobility: Needs Assistance Bed Mobility: Supine to Sit     Supine to sit: Min assist     General bed mobility comments: stability assist only  Transfers Overall transfer level: Needs assistance Equipment used: 1 person hand held assist Transfers: Sit to/from Stand;Stand Pivot Transfers Sit to Stand: Min assist Stand pivot transfers: Min assist       General transfer comment: stability assist and cues for hand placement for safety  Ambulation/Gait Ambulation/Gait assistance: Min assist Ambulation Distance (Feet): 15 Feet Assistive device: Rolling walker (2 wheeled) Gait Pattern/deviations: Step-through pattern;Decreased step length - right;Decreased stride length;Narrow base of support Gait velocity: slow   General Gait Details: generally unsteady, improved by use of the RW.  Stairs            Wheelchair Mobility     Modified Rankin (Stroke Patients Only) Modified Rankin (Stroke Patients Only) Pre-Morbid Rankin Score: No symptoms Modified Rankin: Moderately severe disability     Balance Overall balance assessment: Needs assistance Sitting-balance support: No upper extremity supported Sitting balance-Leahy Scale: Fair     Standing balance support: Bilateral upper extremity supported;Single extremity supported Standing balance-Leahy Scale: Poor                               Pertinent Vitals/Pain Pain Assessment: No/denies pain    Home Living Family/patient expects to be discharged to:: Private residence Living Arrangements: Spouse/significant other;Other relatives Available Help at Discharge: Family;Available 24 hours/day Type of Home: House Home Access: Stairs to enter Entrance Stairs-Rails: Psychiatric nurse of Steps: several Home Layout: One level Home Equipment: Shower seat;Bedside commode      Prior Function Level of Independence: Independent         Comments: driving, running errands, but progressively having less available energy.     Hand Dominance        Extremity/Trunk Assessment   Upper Extremity Assessment: LUE deficits/detail       LUE Deficits / Details: moves uE in isolation, but generally weak.   Lower Extremity Assessment: Generalized weakness (L mildly weaker, but bilaterally weak proximally.)      Cervical / Trunk Assessment: Kyphotic  Communication   Communication: No difficulties  Cognition Arousal/Alertness: Awake/alert Behavior During Therapy: WFL for tasks assessed/performed Overall Cognitive Status: Within Functional Limits for tasks assessed  General Comments General comments (skin integrity, edema, etc.): vitals stable overall wit sats in upper 90's, HR low normal around 60 bpm    Exercises        Assessment/Plan    PT Assessment Patient needs continued PT services  PT  Diagnosis Difficulty walking;Generalized weakness   PT Problem List Decreased strength;Decreased activity tolerance;Decreased balance;Decreased mobility;Decreased knowledge of use of DME;Cardiopulmonary status limiting activity  PT Treatment Interventions DME instruction;Gait training;Functional mobility training;Stair training;Therapeutic activities;Balance training;Patient/family education   PT Goals (Current goals can be found in the Care Plan section) Acute Rehab PT Goals Patient Stated Goal: get home with adequate assist PT Goal Formulation: With patient Time For Goal Achievement: 06/26/16 Potential to Achieve Goals: Good    Frequency Min 3X/week   Barriers to discharge        Co-evaluation               End of Session   Activity Tolerance: Patient tolerated treatment well;Patient limited by fatigue Patient left: in chair;with call bell/phone within reach Nurse Communication: Mobility status         Time: [id number] PT Time Calculation (min) (ACUTE ONLY): 48 min   Charges:   PT Evaluation $PT Eval Moderate Complexity: 1 Procedure PT Treatments $Gait Training: 8-22 mins $Therapeutic Activity: 8-22 mins   PT G Codes:        Lindy Pennisi, Tessie Fass 06/12/2016, 2:45 PM  06/12/2016  Donnella Sham, PT [phone]  (pager)

## 2016-06-12 NOTE — Progress Notes (Signed)
PULMONARY / CRITICAL CARE MEDICINE   Name: Lydia [name] MRN: [id number] DOB: 04-19-1927    ADMISSION DATE:  06/08/2016 CONSULTATION DATE:  6/12  REFERRING MD:  Dr. Lovena Le EP  CHIEF COMPLAINT:  Unresponsive s/p pacemaker placement  HISTORY OF PRESENT ILLNESS:   82yo female with HTN, COPD, tachy-brady syndrome having a pacemaker placed 6/12 when she became unresponsive with L sided weaness.  Code stroke called and pt intubated and tx ICU.  Mental status improving.   SUBJECTIVE:  Looks comfortable. No distress. Wants to eat.   VITAL SIGNS: BP 110/99 mmHg  Pulse 65  Temp(Src) 97.4 F (36.3 C) (Oral)  Resp 28  Ht 5\' 6"  (1.676 m)  Wt 85 lb 1.6 oz (38.6 kg)  BMI 13.74 kg/m2  SpO2 90%  HEMODYNAMICS:    VENTILATOR SETTINGS:    INTAKE / OUTPUT: I/O last 3 completed shifts: In: 2137.3 [P.O.:120; I.V.:913.3; NG/GT:804; IV Piggyback:300] Out: 1477 [Urine:1477]  PHYSICAL EXAMINATION: General:  Frail, cachetic, elderly female, NAD  Neuro: Awake and alert. Follows commands, nods appropriately, MAE, mild R sided weakness, but improving  HEENT: PERRL, mucous membranes dry Cardiovascular:  Afib/Aflutter with rate controlled. No JVD, No murmurs Lungs: posterior rhonchi, no accessory use  Abdomen: flat, + BS,  Musculoskeletal: moving all extremities to command,slightly weaker on right.  LABS:  BMET  Recent Labs Lab 06/11/16 0457 06/11/16 1026 06/12/16 0327  NA 136 134* 136  K 4.7 3.9 4.2  CL 98* 100* 102  CO2 28 23 27   BUN 28* 30* 30*  CREATININE 0.83 0.84 0.73  GLUCOSE 168* 173* 124*    Electrolytes  Recent Labs Lab 06/10/16 1625 06/10/16 2109 06/11/16 0457 06/11/16 1026 06/12/16 0327  CALCIUM  --  9.1 9.2 9.0 9.1  MG 1.7 1.8 1.9  --  1.9  PHOS 4.9*  --  3.9  --  3.7    CBC  Recent Labs Lab 06/10/16 0205 06/11/16 0457 06/12/16 0327  WBC 11.0* 12.4* 12.5*  HGB 11.8* 12.0 12.4  HCT 36.3 38.4 39.2  PLT 190 236 218    Coag's  Recent Labs Lab  06/05/16 1856 06/08/16 1826  APTT 29 33  INR 0.94 1.14    Sepsis Markers  Recent Labs Lab 06/08/16 2011 06/09/16 1133 06/09/16 1510 06/10/16 0205 06/11/16 0457  LATICACIDVEN 2.1* 1.5 1.4  --   --   PROCALCITON  --  0.43  --  0.23 0.16    ABG  Recent Labs Lab 06/08/16 2031 06/10/16 0410  PHART 7.254* 7.487*  PCO2ART 63.9* 34.1*  PO2ART 478.0* 103.0*    Liver Enzymes  Recent Labs Lab 06/08/16 2052  AST 54*  ALT 60*  ALKPHOS 112  BILITOT 1.1  ALBUMIN 2.9*    Cardiac Enzymes No results for input(s): TROPONINI, PROBNP in the last 168 hours.  Glucose  Recent Labs Lab 06/10/16 2024 06/11/16 0045 06/11/16 0501 06/11/16 0808 06/11/16 1203 06/11/16 1552  GLUCAP 152* 150* 167* 142* 130* 100*    Imaging No results found.   STUDIES: 6/13: CXR>>Right perihilar and left lower lobe infiltrates are present. 6/12: CT Brain>> Hyperdense lesion involving the falx near the vertex representing meningioma, 18 mm 6/13: CTA Brain >  Negative for emergent large vessel occlusion. xtensive atherosclerosis, but no anterior circulation hemodynamically significant stenosis. There are mild bilateral ICA siphon stenoses. Up to moderate stenoses of the bilateral vertebral arteries and left PCA P2 segment in the posterior circulation. No acute or evolving cortically based infarct identified. Advanced white matter  signal changes compatible with chronic small vessel disease. Partially visible patchy and nodular left upper lung opacity there is an associated moderate size layering left pleural effusion. Underlying emphysema. Parafalcine 16 mm meningioma at the level of the cingulate gyri with no significant mass effect and no definite cerebral edema.  CULTURES: 6/12: Sputum Culture>>NOF  ANTIBIOTICS: 6/12: Levaquin>>> 6/12 Vanc>>>6/15  SIGNIFICANT EVENTS: 6/12>> Pace Maker Placement 6/12>> Intubated Post Pace Placement 2/2 ? Stroke vs TIA  LINES/TUBES: 6/12: PIV 6/12:  ETT>>6/15 6/12: Foley  DISCUSSION: Admitted to [facility] after becoming unresponsive after pacemaker placement. Code stroke initiated due to patient not moving left side.  She was intubated on arrival to [facility]. CT/CTA completed. Neurology consulted.CT head shows meningioma, but 6/13 . Now extubated. Looks comfortable. Will de-escalate pulmonary interventions. Change abx to oral for 7d total, change solumed to pred and taper over about 10d, wean O2, mobilize, add flutter. PCCM will see again Monday to tie up final pulm regimen and set up f/u    ASSESSMENT / PLAN:   Acute Respiratory Failure - r/t AMS, AECOPD, ?aspiration PNA -->resolved. Extubated 6/15 COPD  Small L effusion on CT Plan:   Pulmonary hygiene  Wean solumedrol to pred and taper to off over 10d or so Day 4/7 levaquin (changed to oral and stop date placed) Will set her up w/ pulm f/u   Hypertension Afib/Aflutter Tachy-brady syndrome s/p pacemaker  Plan:  Cardiology/EP primary Continue Amiodarone gtt  Maintain SBP< 180 eliquis   Protein calorie malnutrition  Weight Loss - Recent 50 Lb weight loss (Maybe 2/2 COPD cachexia) Plan   Adv diet  Nutritional consult   Iron Deficiency Anemia Polycythemia Plan:  Followed by oncology F/u CBC   Stroke - r/t AFib  Incidental finding of Meningioma 6/12 Plan:   Neurology following  Supportive care  Heparin gtt as above    FAMILY  - Updates: Husband  And daughter updated at bedside 6/14  - Inter-disciplinary family meet or Palliative Care meeting due by: 06/15/16  [name] ACNP-BC [place] Pager # [phone] OR # [phone] if no answer  06/12/2016 9:48 AM  Still has cough.  Denies chest pain.  Breathing better.  Alert, no wheeze, HR regular, Abd soft.  WBC 12.5, Creatinine 0.73.  Assessment/plan:  Acute respiratory failure, hypoxia. AECOPD. Aspiration PNA. - oxygen to keep SpO2 90 to 95% - continue brovana/pulmicort - wean off  prednisone as tolerated over next one week - bronchial hygiene - day 4/7 of levaquin  PCCM will be available as needed over weekend >> call if help needed.  If she is still in hospital, then PCCM will f/u on Monday, June 19.  Chesley Mires, MD [facility] Pulmonary/Critical Care 06/12/2016, 10:35 AM Pager:  [phone] After 3pm call: [phone]

## 2016-06-12 NOTE — Progress Notes (Signed)
[place] for Eliquis Indication: Afib   Allergies  Allergen Reactions  . Penicillins Hives    Has patient had a PCN reaction causing immediate rash, facial/tongue/throat swelling, SOB or lightheadedness with hypotension: Yes Has patient had a PCN reaction causing severe rash involving mucus membranes or skin necrosis: No Has patient had a PCN reaction that required hospitalization No Has patient had a PCN reaction occurring within the last 10 years. No If all of the above answers are "NO", then may proceed with Cephalosporin use.    Patient Measurements: Height: 5\' 6"  (167.6 cm) Weight: 85 lb 1.6 oz (38.6 kg) IBW/kg (Calculated) : 59.3 Heparin Dosing Weight: 41 kg  Vital Signs: Temp: 97.4 F (36.3 C) (06/16 0800) Temp Source: Oral (06/16 0800) BP: 145/71 mmHg (06/16 0800) Pulse Rate: 61 (06/16 0800)  Labs:  Recent Labs  06/10/16 0205  06/11/16 0457 06/11/16 1026 06/11/16 1905 06/12/16 0327  HGB 11.8*  --  12.0  --   --  12.4  HCT 36.3  --  38.4  --   --  39.2  PLT 190  --  236  --   --  218  HEPARINUNFRC <0.10*  < >  --  0.32 0.28* 0.75*  CREATININE 1.01*  < > 0.83 0.84  --  0.73  < > = values in this interval not displayed.  Estimated Creatinine Clearance: 30.8 mL/min (by C-G formula based on Cr of 0.73).   Medical History: Past Medical History  Diagnosis Date  . Essential hypertension   . Osteoporosis   . Lower back pain   . History of stomach ulcers     a. s/p surgery ~ 40 yrs ago.  . Polycythemia     a. phlebotomy.  Marland Kitchen COPD (chronic obstructive pulmonary disease) ([place])   . IDA (iron deficiency anemia)     a. 04/2016 CT Abd: no malignancy.  . Weight loss, unintentional   . Thyroid nodule     a. 04/2016 large left thyroid nodule on CT chest  - rec dedicated u/s.  . Pulmonary nodule, left     a. 04/2016 CT chest: 50mm LUL nodule - f/u CT in 12 months (h/o smoking).  Marland Kitchen PAD (peripheral artery disease) ([place])     a.  04/2016 CT Abd: occluded prox LSFA.  . Superior mesenteric artery stenosis ([place])     a. 04/2016 CT Abd: atheromatous plaque and mural thrombus in SMA.  . Tobacco abuse   . New onset atrial flutter ([place]) 05/2016  . Tachy-brady syndrome ([place]) 05/2016    a. to [facility] for PPM placement    Assessment: 82 year old female who presented to [place] on 6/9 with new onset Afib with RVR.  On 6/12 the patient was transferred to [facility] for PPM placement. Post placement, the patient was noted to have left sided weakness and a right gaze - code stroke was called. Neuro was consulted - the patient is noted to have a R-CVA thought to be d/t Afib. Repeat CT head/neck showed small R brain infarct. Pt has remained on a heparin infusion while she was intubated. She was extubated yesterday and will now be transitioned to Eliquis. She will get the low dose Eliquis due to her age and weight.   Goal of Therapy:  Heparin level 0.3-0.5 units/ml Monitor platelets by anticoagulation protocol: Yes    Plan:  -Stop heparin -Give the first dose of Eliquis at the same time heparin is discontinued -Eliquis 2.5 mg po  bid -Monitor s/sx bleeding closely given weight and age of patient   Hughes Better, PharmD, [place] Pharmacist Pager: [phone] 06/12/2016 8:36 AM

## 2016-06-12 NOTE — Progress Notes (Addendum)
Nutrition Follow-up  DOCUMENTATION CODES:   Severe malnutrition in context of chronic illness  INTERVENTION:   Recommend liberalizing diet  Magic cup TID with meals, each supplement provides 290 kcal and 9 grams of protein  NUTRITION DIAGNOSIS:   Malnutrition (Severe) related to chronic illness (COPD) as evidenced by severe depletion of body fat, severe depletion of muscle mass. Ongoing.   GOAL:   Patient will meet greater than or equal to 90% of their needs Progressing.   MONITOR:   PO intake, Supplement acceptance, I & O's, Skin, Labs  ASSESSMENT:   Pt with hx of severe COPD, PVD and "recent" 50 lb weight loss tx from [facility] where she presented with afib with RVR. Pt had pacemaker placed and had neuro changes, code stroke called pt intubated, CT showed severe small vessel dz without acute findings.   6/15 extubated 6/16 Heart Healthy diet started Labs reviewed: K+, PO4 and magnesium all WNL CBG's: 100-130  Pt does not like ensure but had tried to drink some PTA. Pt prefers vanilla over chocolate. Discussed other ONS, agreeable to magic cups, will add to meal trays.   Diet Order:  Diet Heart Room service appropriate?: Yes; Fluid consistency:: Thin  Skin:  Wound (see comment) (stage I sacrum, skin tears, chest incision)  Last BM:  unknown  Height:   Ht Readings from Last 1 Encounters:  06/08/16 5\' 6"  (1.676 m)    Weight:   Wt Readings from Last 1 Encounters:  06/12/16 85 lb 1.6 oz (38.6 kg)    Ideal Body Weight:  59 kg  BMI:  Body mass index is 13.74 kg/(m^2).  Estimated Nutritional Needs:   Kcal:  1350-1500  Protein:  70-80 grams  Fluid:  >1.5 L/day  EDUCATION NEEDS:   No education needs identified at this time  [place], [place], [place] Pager [phone] After Hours Pager

## 2016-06-12 NOTE — Discharge Instructions (Signed)

## 2016-06-12 NOTE — Care Management Important Message (Signed)
Important Message  Patient Details  Name: Lydia [name] MRN: [id number] Date of Birth: 1927-01-30   Medicare Important Message Given:  Yes    Loann Quill 06/12/2016, 9:18 AM

## 2016-06-12 NOTE — Progress Notes (Signed)
SUBJECTIVE:  The patient is awake, extrubated doing well without chest pain or sob Recalls events ( sort of ) and GT s name  . amLODipine  5 mg Oral Daily  . antiseptic oral rinse  7 mL Mouth Rinse BID  . arformoterol  15 mcg Nebulization BID  . aspirin  325 mg Oral Daily  . atorvastatin  10 mg Oral q1800  . budesonide (PULMICORT) nebulizer solution  0.5 mg Nebulization BID  . ipratropium-albuterol  3 mL Nebulization Q6H  . levofloxacin (LEVAQUIN) IV  500 mg Intravenous Q48H  . methylPREDNISolone (SOLU-MEDROL) injection  20 mg Intravenous Q12H  . multivitamin  15 mL Oral Daily  . pantoprazole  40 mg Oral Q1200   . amiodarone 30 mg/hr (06/11/16 2000)  . heparin 900 Units/hr (06/12/16 0440)    OBJECTIVE: Physical Exam: Filed Vitals:   06/12/16 0400 06/12/16 0500 06/12/16 0600 06/12/16 0800  BP: 142/71 142/70 111/90 145/71  Pulse: 3 63 63 61  Temp: 97.4 F (36.3 C)   97.4 F (36.3 C)  TempSrc: Oral   Oral  Resp: 17 20 21 15   Height:      Weight: 85 lb 1.6 oz (38.6 kg)     SpO2: 98% 98% 98% 95%    Intake/Output Summary (Last 24 hours) at 06/12/16 0809 Last data filed at 06/12/16 0600  Gross per 24 hour  Intake 1069.12 ml  Output    972 ml  Net  97.12 ml    Telemetry reveals SR this morning, PAF yesterday with occ pacing   GEN- The patient is intubated, alert and oriented, nods/shakes head yes/no, follows directions   Head- normocephalic, atraumatic Eyes-  Sclera clear, conjunctiva pink Ears- hearing intact ky0photic Oropharynx- clear Neck- supple, no JVP Lungs-  Decrease BS  Heart- RRR, no significant murmurs, no rubs or gallops GI- soft, NT, ND Extremities- no clubbing, cyanosis, or edema Skin- no rash or lesion Psych- euthymic mood, full affect Neuro- moving all extremities this morning, following directions  PPM site is stable, dressing is dry, no hematoma   LABS: Basic Metabolic Panel:  Recent Labs  06/11/16 0457 06/11/16 1026  06/12/16 0327  NA 136 134* 136  K 4.7 3.9 4.2  CL 98* 100* 102  CO2 28 23 27   GLUCOSE 168* 173* 124*  BUN 28* 30* 30*  CREATININE 0.83 0.84 0.73  CALCIUM 9.2 9.0 9.1  MG 1.9  --  1.9  PHOS 3.9  --  3.7   Liver Function Tests: No results for input(s): AST, ALT, ALKPHOS, BILITOT, PROT, ALBUMIN in the last 72 hours. CBC:  Recent Labs  06/10/16 0205 06/11/16 0457 06/12/16 0327  WBC 11.0* 12.4* 12.5*  NEUTROABS 10.5*  --   --   HGB 11.8* 12.0 12.4  HCT 36.3 38.4 39.2  MCV 83.4 83.1 85.6  PLT 190 236 218   Fasting Lipid Panel: No results for input(s): CHOL, HDL, LDLCALC, TRIG, CHOLHDL, LDLDIRECT in the last 72 hours.  RADIOLOGY: 06/09/16 CT angio head IMPRESSION: 1. Negative for emergent large vessel occlusion. 2. Extensive atherosclerosis, but no anterior circulation hemodynamically significant stenosis. There are mild bilateral ICA siphon stenoses. 3. Up to moderate stenoses of the bilateral vertebral arteries and left PCA P2 segment in the posterior circulation. 4. No acute or evolving cortically based infarct identified. Advanced white matter signal changes compatible with chronic small vessel disease. 5. Intubated. Small volume retained secretions in the trachea. Partially visible patchy and nodular left upper lung opacity is  new since 05/07/2016 compatible with infectious or inflammatory process there is an associated moderate size layering left pleural effusion. Underlying emphysema. 6. Parafalcine 16 mm meningioma at the level of the cingulate gyri with no significant mass effect and no definite cerebral edema.  Dg Chest Port 1 View 06/11/16 IMPRESSION: Lungs appear stable compared to yesterday's chest x-ray. Persistent mild central pulmonary vascular congestion.  Hyperexpanded lungs suggesting COPD. Suspect associated chronic bronchitic changes centrally.  Endotracheal tube well positioned with tip just above the level of the carina.  06/08/2016   CLINICAL DATA:  Unresponsive with left-sided weakness following pacemaker placement. EXAM: PORTABLE CHEST 1 VIEW COMPARISON:  Radiographs 04/04/2010.  CT 05/07/2016. FINDINGS: 1727 hours. Left subclavian pacemaker leads project over the right atrium and right ventricle. The heart size and mediastinal contours are stable allowing for patient rotation to the right. There is diffuse aortic atherosclerosis. There is vascular congestion with new asymmetric perihilar and lower lobe airspace disease on the left. No pneumothorax or significant pleural effusion identified. The bones appear unchanged. IMPRESSION: 1. The left subclavian pacemaker leads appear satisfactorily positioned in the right atrium and right ventricle. No pneumothorax. 2. New vascular congestion with asymmetric airspace disease on the left consistent with asymmetric edema or aspiration. Electronically Signed   By: Richardean Sale M.D.   On: 06/08/2016 17:44    ASSESSMENT AND PLAN:   1. Tachy-brady syndrome, transferred  from [facility] for PPM      S/p PPM 06/08/16 with Dr. Lovena Le             2. Lyndee Hensen PPM procedure respiratory depression/change in MS/L sided defecit     Appreciate neuro     Initial CT negative for acute changes, CT angio noted above     Suspect suffered right brain associated with atrial fibrillation     Started on ASA per neuro     BP peramaters as per neuro (140-160 SBP)     Neuro-wise she remains much improved this morning, await further neurology eval and recommendations today  PAFib (new)     On heparin gtt initially, held for her PPM implant     Heparin gtt re-started 06/09/16, management with pharmacy , H/H plts are stable     Neuro note to consider long term a/c when able to take PO     continue amio gtt for now, she is currently in SR.   COPD/emphysema     Intubuated, unable to wean so far     Management with CCM, appreciate help and input     ?aspiration vs edema     Patient requests tube out, defer to  CCM     She is a longstanding heavy smoker until the day she went into the hospital  PVD     Recent hx of 50 lbs weight loss, anorexia, with mesenteric ischemia.subtotal occ of SMA, occ L SFA  HTN     BP stable     Permissive BP as per neuro parameter    Leukocytosis   ?aspiration, on levaquin    She is doing well post extubation Will transition IV amio>>PO IV heparin>>apixoban  OT and PT to eval Continue abx per CCM  Will check wi neuro, not sure why she needs to be on ASA  Will have to think about timing for vasc eval-- will prob want to defer a few weeks and then do with one missed dose and IV heparin   Have discussed strategies for STOPPING SMOKING

## 2016-06-12 NOTE — Progress Notes (Signed)
ANTICOAGULATION CONSULT NOTE - Follow Up Consult  Pharmacy Consult for heparin Indication: atrial fibrillation and stroke   Labs:  Recent Labs  06/10/16 0205  06/10/16 2109 06/11/16 0457 06/11/16 1026 06/11/16 1905 06/12/16 0327  HGB 11.8*  --   --  12.0  --   --  12.4  HCT 36.3  --   --  38.4  --   --  39.2  PLT 190  --   --  236  --   --  218  HEPARINUNFRC <0.10*  < > 0.14*  --  0.32 0.28* 0.75*  CREATININE 1.01*  --  1.01* 0.83 0.84  --   --   < > = values in this interval not displayed.   Assessment: 82yo female now above goal on heparin after small rate change for low level; of note labs are having to be drawn from same arm as where heparin is running though this lab was drawn from hand and heparin running through [facility].  Goal of Therapy:  Heparin level 0.3-0.5 units/ml   Plan:  Will decrease heparin gtt by 3-4 units/kg/hr to 900 units/hr and check level in [place], PharmD, BCPS  06/12/2016,4:36 AM

## 2016-06-12 NOTE — Progress Notes (Signed)
STROKE TEAM PROGRESS NOTE   HISTORY OF PRESENT ILLNESS (per record) Lydia [name] is a 82 y.o. female with history of severe COPD, peripheral vascular disease, recent history of 50 pound weight loss, initially transferred from [facility] where she presented with atrial fibrillation with RVR. Patient was getting a pacemaker procedure. Last known normal around 4 PM 06/08/2106 (LKW). During the end of the procedure patient was making gurgling noises and was not moving her left side. Patient was treated with IV heparin until 3 AM last night. Code stroke was activated around 6 PM. Patient was transferred to medical ICU for intubation to protect her airway. CT scan was done 6:50 PM which was reviewed and shows signs of severe small vessel disease without any acute findings. Patient was not administered IV t-PA.    SUBJECTIVE (INTERVAL HISTORY)  She seems to be neurologically stable moving her left side well now. She was extubated y`day and is doing well and started on eliquis and is off heparin drip now OBJECTIVE Temp:  [97.3 F (36.3 C)-97.8 F (36.6 C)] 97.3 F (36.3 C) (06/16 1211) Pulse Rate:  [3-71] 71 (06/16 1400) Cardiac Rhythm:  [-] Normal sinus rhythm (06/16 0800) Resp:  [15-34] 18 (06/16 1400) BP: (110-160)/(58-118) 143/64 mmHg (06/16 1400) SpO2:  [90 %-100 %] 100 % (06/16 1400) Weight:  [85 lb 1.6 oz (38.6 kg)] 85 lb 1.6 oz (38.6 kg) (06/16 0400)  CBC:   Recent Labs Lab 06/08/16 2052  06/10/16 0205 06/11/16 0457 06/12/16 0327  WBC 22.8*  < > 11.0* 12.4* 12.5*  NEUTROABS 21.4*  --  10.5*  --   --   HGB 13.7  < > 11.8* 12.0 12.4  HCT 43.1  < > 36.3 38.4 39.2  MCV 85.9  < > 83.4 83.1 85.6  PLT 237  < > 190 236 218  < > = values in this interval not displayed.  Basic Metabolic Panel:   Recent Labs Lab 06/11/16 0457 06/11/16 1026 06/12/16 0327  NA 136 134* 136  K 4.7 3.9 4.2  CL 98* 100* 102  CO2 28 23 27   GLUCOSE 168* 173* 124*  BUN 28* 30* 30*   CREATININE 0.83 0.84 0.73  CALCIUM 9.2 9.0 9.1  MG 1.9  --  1.9  PHOS 3.9  --  3.7    Lipid Panel:     Component Value Date/Time   CHOL 162 06/08/2016 2052   CHOL 175 11/08/2014 1341   TRIG 92 06/08/2016 2052   TRIG 109 11/08/2014 1341   HDL 61 06/08/2016 2052   HDL 68* 11/08/2014 1341   CHOLHDL 2.7 06/08/2016 2052   VLDL 18 06/08/2016 2052   VLDL 22 11/08/2014 1341   LDLCALC 83 06/08/2016 2052   LDLCALC 85 11/08/2014 1341   HgbA1c:  Lab Results  Component Value Date   HGBA1C 5.4 06/08/2016   Urine Drug Screen: No results found for: LABOPIA, COCAINSCRNUR, LABBENZ, AMPHETMU, THCU, LABBARB    IMAGING  Dg Chest Portable 1 View  06/11/2016  CLINICAL DATA:  Acute respiratory failure, shortness of breath. EXAM: PORTABLE CHEST 1 VIEW COMPARISON:  Chest x-rays dated 06/10/2016 in 05/2012 2017. FINDINGS: Endotracheal tube is well positioned with tip just above the level of the carina. Enteric tube passes below the diaphragm. Left chest wall pacemaker/ ICD appears stable in position. Cardiomediastinal silhouette is stable in size and configuration. Lungs appear stable compared to yesterday's chest x-ray. There is improved aeration at the left lung base compared to the  exam of 06/09/2016. Mild central pulmonary vascular congestion persists. Lungs are again hyperexpanded suggesting COPD. Probable associated chronic bronchitic changes centrally. No pleural effusion or pneumothorax seen. IMPRESSION: Lungs appear stable compared to yesterday's chest x-ray. Persistent mild central pulmonary vascular congestion. Hyperexpanded lungs suggesting COPD. Suspect associated chronic bronchitic changes centrally. Endotracheal tube well positioned with tip just above the level of the carina. Electronically Signed   By: Franki Cabot M.D.   On: 06/11/2016 07:40   2D Echocardiogram  - Left ventricle: The cavity size was mildly dilated. Wall thickness was increased in a pattern of mild LVH. Systolic function  was normal. The estimated ejection fraction was in the range of 50% to 55%. The study is not technically sufficient to allow evaluation of LV diastolic function. - Aortic valve: Calcified non coronary cusp. - Mitral valve: There was mild regurgitation. - Left atrium: The atrium was mildly dilated. - Right atrium: The atrium was mildly dilated. - Atrial septum: No defect or patent foramen ovale was identified. - Pericardium, extracardiac: Small localized posterior lateral effusion CT angio brain and neck 06/09/2016 ; 1. Negative for emergent large vessel occlusion. 2. Extensive atherosclerosis, but no anterior circulation hemodynamically significant stenosis. There are mild bilateral ICA siphon stenoses. 3. Up to moderate stenoses of the bilateral vertebral arteries and left PCA P2 segment in the posterior circulation  PHYSICAL EXAM Frail elderly caucasian lady not in distress . Marland Kitchen Afebrile. Head is nontraumatic. Neck is supple without bruit.    Cardiac exam no murmur or gallop. Lungs are clear to auscultation. Distal pulses are well felt. Neurological Exam : Awake and alert.no aphasia or dysarthria.. Follows commands quite consistently. Eyes are open. Pupils equal reactive. Fundi were not visualized. Blinks to threat bilaterally. Face is symmetric without weakness. Tongue is midline. Motor system exam reveals no upper or lower extremity drift. Mild weakness of left grip and intrinsic hand muscles. Symmetric lower extremity strength. Reflexes 2+ symmetric. Plantars downgoing. Sensation intact bilaterally. Gait was not tested. ASSESSMENT/PLAN Lydia [name] is a 82 y.o. female with history of severe COPD, peripheral vascular disease, recent 29 # weight loss, who presented  To [facility] with AF w/ RVR, was transferred to [facility], who post pacemaker placement  became unresponsive and developed L hemiparesis. She did not receive IV t-PA .   Stroke:  Non-dominant right brain small infarct embolic  secondary to atrial fib not visualized on Ct head x 2.   Resultant  Mild left sided weakness  CT falx meningioma  CTA head pending  CTA neck  pending  2D Echo  EF 50-55%. No source of embolus   LDL 83  HgbA1c 5.4  SCDs for VTE prophylaxis Diet Heart Room service appropriate?: Yes; Fluid consistency:: Thin  No antithrombotic prior to admission, now on aspirin 300 mg suppository daily  Patient counseled to be compliant with her antithrombotic medications  Ongoing aggressive stroke risk factor management  Therapy recommendations: pending  Disposition:  pending  Atrial Fibrillation w/ RVR, new onset Sick Sinus Syndrome  Home anticoagulation:  No antithrombotic   On IV heparin until 06/08/2016 at 0300  Continue Aspirin now but will need NOAC at discharge    Hypertension  Stable   Permissive hypertension (OK if < 220/120) but gradually normalize in 5-7 days  Long-term BP goal normotensive  Hyperlipidemia  Home meds:  No statin  LDL 83, goal < 70  Add statin -lipitor 10 mg  Continue statin at discharge  Other Stroke Risk Factors  Advanced  age  Cigarette smoker, advised to stop smoking  Coronary artery disease in aorta and coronaries, seen on CT  PAD  Mesenteric ischemia and PVD  Other Active Problems  Unexplained weight loss  COPD  Polycythemia   Malnutrition, Body mass index is 13.74 kg/(m^2).   Hospital day # 4  I have personally examined this patient, reviewed notes, independently viewed imaging studies, participated in medical decision making and plan of care. I have made any additions or clarifications directly to the above note.  Continue Eliquis. And lipitor. I had a long discussion at the bedside with the patient, . Greater than 50% time during this 25 minute  visit was spent on counseling and coordination of care about stroke risk and prevention.Follow up as outpatient in stroke clinic in 2 months. Stroke team will sign off.  Antony Contras, MD Medical Director [facility] Pager: [phone] 06/12/2016 10:46 AM     To contact Stroke Continuity provider, please refer to [email or web address] After hours, contact General Neurology

## 2016-06-13 NOTE — Progress Notes (Signed)
Patient ID: Lydia [name], female   DOB: 1927-09-26, 82 y.o.   MRN: [id number]    Subjective:  Denies SSCP, palpitations or Dyspnea Mild cough  Objective:  Filed Vitals:   06/13/16 0400 06/13/16 0500 06/13/16 0600 06/13/16 0700  BP: 148/76 158/65 157/77   Pulse: 59 59 60   Temp: 98.1 F (36.7 C)   97.5 F (36.4 C)  TempSrc: Oral   Oral  Resp: 23 27 21    Height:      Weight: 92 lb 13 oz (42.1 kg)     SpO2: 99% 97% 96%     Intake/Output from previous day:  Intake/Output Summary (Last 24 hours) at 06/13/16 1118 Last data filed at 06/13/16 0100  Gross per 24 hour  Intake    480 ml  Output    625 ml  Net   -145 ml    Physical Exam: Affect appropriate Frail thin female  HEENT: normal Neck supple with no adenopathy JVP normal no bruits no thyromegaly Lungs clear with no wheezing and good diaphragmatic motion Heart:  S1/S2 no murmur, no rub, gallop or click pacer under left clavicle  PMI normal Abdomen: benighn, central bruit  Left femoral  bruit.  No HSM or HJR Distal pulses intact with no bruits No edema Neuro non-focal Skin warm and dry No muscular weakness   Lab Results: Basic Metabolic Panel:  Recent Labs  06/11/16 0457 06/11/16 1026 06/12/16 0327  NA 136 134* 136  K 4.7 3.9 4.2  CL 98* 100* 102  CO2 28 23 27   GLUCOSE 168* 173* 124*  BUN 28* 30* 30*  CREATININE 0.83 0.84 0.73  CALCIUM 9.2 9.0 9.1  MG 1.9  --  1.9  PHOS 3.9  --  3.7   CBC:  Recent Labs  06/11/16 0457 06/12/16 0327  WBC 12.4* 12.5*  HGB 12.0 12.4  HCT 38.4 39.2  MCV 83.1 85.6  PLT 236 218    Imaging: No results found.  Cardiac Studies:  ECG: aflutter rate 123  06/05/16   Telemetry:  AV pacing   Echo:  06/06/16 reviewed Study Conclusions  - Left ventricle: The cavity size was mildly dilated. Wall  thickness was increased in a pattern of mild LVH. Systolic  function was normal. The estimated ejection fraction was in the  range of 50% to 55%. The study is not  technically sufficient to  allow evaluation of LV diastolic function. - Aortic valve: Calcified non coronary cusp. - Mitral valve: There was mild regurgitation. - Left atrium: The atrium was mildly dilated. - Right atrium: The atrium was mildly dilated. - Atrial septum: No defect or patent foramen ovale was identified. - Pericardium, extracardiac: Small localized posterior lateral  effusion  Medications:   . amiodarone  400 mg Oral BID  . amLODipine  5 mg Oral Daily  . antiseptic oral rinse  7 mL Mouth Rinse BID  . apixaban  2.5 mg Oral BID  . arformoterol  15 mcg Nebulization BID  . aspirin EC  81 mg Oral Daily  . atorvastatin  10 mg Oral q1800  . budesonide (PULMICORT) nebulizer solution  0.5 mg Nebulization BID  . feeding supplement (ENSURE ENLIVE)  237 mL Oral BID BM  . levofloxacin  500 mg Oral Q48H  . multivitamin  15 mL Oral Daily  . pantoprazole  40 mg Oral Q1200  . predniSONE  30 mg Oral Q breakfast       Assessment/Plan:  Respiratory failure:  Extubated upper airway  rhonchi needs lvofloxacin one more day f/u CXR am SSS:  Normal pacer function pocket shallow look ok CVA:  ? CTA with no carotid dx or acute changes MS improved on low dose apixaban ? Related to PAF PVD:  Marked weight loss with mesenteric DX and abdominal bruit. However by history no pain with eating Mild constipation no nausea/ vomiting can w/u as outpatient   Jenkins Rouge 06/13/2016, 11:18 AM

## 2016-06-14 DIAGNOSIS — R634 Abnormal weight loss: Secondary | ICD-10-CM

## 2016-06-14 DIAGNOSIS — I10 Essential (primary) hypertension: Secondary | ICD-10-CM

## 2016-06-14 MED ORDER — PROSIGHT PO TABS
1.0000 | ORAL_TABLET | Freq: Every day | ORAL | Status: DC
  Administered 2016-06-14 – 2016-06-16 (×3): 1 via ORAL
  Filled 2016-06-14 (×4): qty 1

## 2016-06-14 NOTE — Progress Notes (Signed)
Patient: Lydia [name] / Admit Date: 06/08/2016 / Date of Encounter: 06/14/2016, 8:49 AM   Subjective: "I feel fairly well." No CP, SOB, palpitations.  When asked if she liked her breakfast she stated "I'm not going to answer that."   Objective: Telemetry: maintaining NSR on amio (a pacing) Physical Exam: Blood pressure 169/84, pulse 58, temperature 97.4 F (36.3 C), temperature source Oral, resp. rate 22, height 5\' 6"  (1.676 m), weight 90 lb 6.2 oz (41 kg), SpO2 93 %. General: Cachectic appearing WF in no acute distress. Head: Normocephalic, atraumatic, sclera non-icteric, no xanthomas, nares are without discharge. Neck: Negative for carotid bruits. JVP not elevated. Lungs: Clear bilaterally to auscultation without wheezes, rales, or rhonchi. Breathing is unlabored. Heart: RRR S1 S2 without murmurs, rubs, or gallops. Left upper chest pacer site without acute complication Abdomen: Soft, non-tender, non-distended with normoactive bowel sounds. No rebound/guarding. Extremities: No clubbing or cyanosis. No edema. Distal pedal pulses are 2+ and equal bilaterally. Neuro: Alert and oriented X 3. Moves all extremities spontaneously but left leg hemiparesis persists. Psych:  Responds to questions appropriately with a normal affect.   Intake/Output Summary (Last 24 hours) at 06/14/16 0849 Last data filed at 06/14/16 0654  Gross per 24 hour  Intake    480 ml  Output   1475 ml  Net   -995 ml    Inpatient Medications:  . amiodarone  400 mg Oral BID  . amLODipine  5 mg Oral Daily  . antiseptic oral rinse  7 mL Mouth Rinse BID  . apixaban  2.5 mg Oral BID  . arformoterol  15 mcg Nebulization BID  . atorvastatin  10 mg Oral q1800  . budesonide (PULMICORT) nebulizer solution  0.5 mg Nebulization BID  . feeding supplement (ENSURE ENLIVE)  237 mL Oral BID BM  . levofloxacin  500 mg Oral Q48H  . multivitamin  15 mL Oral Daily  . pantoprazole  40 mg Oral Q1200  . predniSONE  30 mg Oral Q  breakfast   Infusions:    Labs:  Recent Labs  06/11/16 1026 06/12/16 0327  NA 134* 136  K 3.9 4.2  CL 100* 102  CO2 23 27  GLUCOSE 173* 124*  BUN 30* 30*  CREATININE 0.84 0.73  CALCIUM 9.0 9.1  MG  --  1.9  PHOS  --  3.7   No results for input(s): AST, ALT, ALKPHOS, BILITOT, PROT, ALBUMIN in the last 72 hours.  Recent Labs  06/12/16 0327  WBC 12.5*  HGB 12.4  HCT 39.2  MCV 85.6  PLT 218   No results for input(s): CKTOTAL, CKMB, TROPONINI in the last 72 hours. Invalid input(s): POCBNP No results for input(s): HGBA1C in the last 72 hours.   Radiology/Studies:  Ct Angio Head W/cm &/or Wo Cm  06/09/2016  CLINICAL DATA:  82 year old female status post pacemaker placement yesterday with acute onset left side weakness. Initial encounter. EXAM: CT ANGIOGRAPHY HEAD AND NECK TECHNIQUE: Multidetector CT imaging of the head and neck was performed using the standard protocol during bolus administration of intravenous contrast. Multiplanar CT image reconstructions and MIPs were obtained to evaluate the vascular anatomy. Carotid stenosis measurements (when applicable) are obtained utilizing NASCET criteria, using the distal internal carotid diameter as the denominator. CONTRAST:  50 mL Isovue 370 COMPARISON:  Head CT without contrast 06/08/2016. Chest CT 05/07/2016. FINDINGS: CT HEAD Brain: Stable cerebral volume. No acute intracranial hemorrhage identified. No acute or evolving cortically based infarct identified. Patchy and confluent bilateral  white matter hypodensity appears stable and is nonspecific. Deep gray matter nuclei appear stable and within normal limits. Posterior fossa gray-white matter differentiation appears stable. Calvarium and skull base: Stable and intact. Paranasal sinuses: Stable and well pneumatized visualized paranasal sinuses and mastoids. Orbits: No acute orbit or scalp soft tissue findings. CTA NECK Skeleton: Degenerative changes in the lower cervical spine including  multilevel mild spondylolisthesis. Osteopenia. No acute osseous abnormality identified. Other neck: Intubated. Endotracheal tube tip terminates above the carina. Small volume retained secretions in the trachea. Mild fluid in the pharynx. Layering left pleural effusion, likely moderate in size as fluid tracks to the left apex. Centrilobular emphysema in the visible lung parenchyma. Several small areas of patchy ground-glass opacity in the left upper lobe. There is a partially visible more solid-appearing 8 mm lung nodule on series 501, image 1 which is new since May. No superior mediastinal lymphadenopathy. Left thyroid goiter with dystrophic calcification and partial extension into the superior mediastinum. Negative superior parapharyngeal and retropharyngeal spaces. Sublingual space, submandibular glands and parotid glands are within normal limits. Cachexia. No cervical lymphadenopathy identified. Aortic arch: 3 vessel arch configuration with extensive calcified arch atherosclerosis. Right carotid system: No brachiocephalic artery or right CCA origin stenosis despite calcified plaque. No right CCA stenosis despite calcified plaque. Confluent calcified plaque at the right carotid bifurcation affecting the right ICA origin and to a lesser extent the bulb. Subsequent stenosis is less than 50 % with respect to the distal vessel. Negative cervical right ICA otherwise aside from tortuosity. Left carotid system: No left CCA stenosis despite soft and calcified plaque. Confluent calcified plaque at the posterior left ICA origin. Subsequent stenosis is less than 50 % with respect to the distal vessel. Negative cervical left ICA otherwise. Vertebral arteries: No proximal left subclavian artery stenosis despite soft and calcified plaque. Left vertebral artery origin without stenosis despite soft plaque. Calcified plaque in the left V1, V2, and V3 segments with no hemodynamically significant stenosis. No proximal right  subclavian artery stenosis despite calcified plaque. That plaque does affect the right vertebral artery origin with mild stenosis. There is calcified plaque in the right V1 segment with additional mild stenosis. Mild right V3 segment calcified plaque. No right vertebral artery stenosis to the skullbase. CTA HEAD Posterior circulation: Extensive calcified plaque in the bilateral vertebral artery V4 segments. Mild to moderate bilateral distal vertebral artery stenosis but preserved patency of the vertebrobasilar junction. No basilar artery stenosis. SCA and PCA origins are normal. Posterior communicating arteries are diminutive or absent. The left PCA P2 segment is moderately irregular, but there is preserved distal flow (series 5015, image 20). The right PCA branches are mildly irregular. Anterior circulation: Both ICA siphons are patent with extensive bilateral calcified atherosclerosis. Up to mild left siphon stenosis occurs in the proximal supraclinoid segment. On the right there is mild siphon stenosis at the proximal cavernous segment, it and the proximal supraclinoid segment. Both carotid termini remain patent. Normal MCA and ACA origins. Diminutive or absent anterior communicating artery. Bilateral ACA branches are within normal limits. Left MCA M1 segment, bifurcation, and left MCA branches are patent. There is mild irregularity at the left bifurcation and in the left MCA branches. Right MCA M1 segment, bifurcation, and right MCA branches remain patent. There is mild irregularity at the bifurcation and in the right MCA branches. Venous sinuses: Patent. Anatomic variants: None. Delayed phase: Round homogeneously enhancing 16 mm diameter lesion of the interhemispheric fissure (series 601, image 25) corresponds to the hyperdense  lesion described yesterday and appears stable. Minimal to mild associated mass effect with no definite cerebral edema. No other abnormal intracranial enhancement. IMPRESSION: 1. Negative  for emergent large vessel occlusion. 2. Extensive atherosclerosis, but no anterior circulation hemodynamically significant stenosis. There are mild bilateral ICA siphon stenoses. 3. Up to moderate stenoses of the bilateral vertebral arteries and left PCA P2 segment in the posterior circulation. 4. No acute or evolving cortically based infarct identified. Advanced white matter signal changes compatible with chronic small vessel disease. 5. Intubated. Small volume retained secretions in the trachea. Partially visible patchy and nodular left upper lung opacity is new since 05/07/2016 compatible with infectious or inflammatory process there is an associated moderate size layering left pleural effusion. Underlying emphysema. 6. Parafalcine 16 mm meningioma at the level of the cingulate gyri with no significant mass effect and no definite cerebral edema. Electronically Signed   By: Genevie Ann M.D.   On: 06/09/2016 15:54   Ct Head Wo Contrast  06/08/2016  CLINICAL DATA:  Left-sided weakness EXAM: CT HEAD WITHOUT CONTRAST TECHNIQUE: Contiguous axial images were obtained from the base of the skull through the vertex without intravenous contrast. COMPARISON:  None. FINDINGS: Bony calvarium is intact. No gross soft tissue abnormality is noted. Mild atrophic changes are noted as well as scattered areas of chronic white matter ischemic change. A hemangioma is noted along the falx near the vertex scattered basal ganglia calcifications are seen. IMPRESSION: Atrophic and chronic white matter ischemic changes. Hyperdense lesion involving the falx near the vertex likely representing a meningioma. This measures approximately 18 mm in greatest dimension. No acute abnormality is noted. These results were called by telephone at the time of interpretation on 06/08/2016 at 7:01 pm to Dr. Gifford Shave, who verbally acknowledged these results. Electronically Signed   By: Inez Catalina M.D.   On: 06/08/2016 19:03   Ct Angio Neck W/cm &/or  Wo/cm  06/09/2016  CLINICAL DATA:  82 year old female status post pacemaker placement yesterday with acute onset left side weakness. Initial encounter. EXAM: CT ANGIOGRAPHY HEAD AND NECK TECHNIQUE: Multidetector CT imaging of the head and neck was performed using the standard protocol during bolus administration of intravenous contrast. Multiplanar CT image reconstructions and MIPs were obtained to evaluate the vascular anatomy. Carotid stenosis measurements (when applicable) are obtained utilizing NASCET criteria, using the distal internal carotid diameter as the denominator. CONTRAST:  50 mL Isovue 370 COMPARISON:  Head CT without contrast 06/08/2016. Chest CT 05/07/2016. FINDINGS: CT HEAD Brain: Stable cerebral volume. No acute intracranial hemorrhage identified. No acute or evolving cortically based infarct identified. Patchy and confluent bilateral white matter hypodensity appears stable and is nonspecific. Deep gray matter nuclei appear stable and within normal limits. Posterior fossa gray-white matter differentiation appears stable. Calvarium and skull base: Stable and intact. Paranasal sinuses: Stable and well pneumatized visualized paranasal sinuses and mastoids. Orbits: No acute orbit or scalp soft tissue findings. CTA NECK Skeleton: Degenerative changes in the lower cervical spine including multilevel mild spondylolisthesis. Osteopenia. No acute osseous abnormality identified. Other neck: Intubated. Endotracheal tube tip terminates above the carina. Small volume retained secretions in the trachea. Mild fluid in the pharynx. Layering left pleural effusion, likely moderate in size as fluid tracks to the left apex. Centrilobular emphysema in the visible lung parenchyma. Several small areas of patchy ground-glass opacity in the left upper lobe. There is a partially visible more solid-appearing 8 mm lung nodule on series 501, image 1 which is new since May. No superior mediastinal lymphadenopathy.  Left  thyroid goiter with dystrophic calcification and partial extension into the superior mediastinum. Negative superior parapharyngeal and retropharyngeal spaces. Sublingual space, submandibular glands and parotid glands are within normal limits. Cachexia. No cervical lymphadenopathy identified. Aortic arch: 3 vessel arch configuration with extensive calcified arch atherosclerosis. Right carotid system: No brachiocephalic artery or right CCA origin stenosis despite calcified plaque. No right CCA stenosis despite calcified plaque. Confluent calcified plaque at the right carotid bifurcation affecting the right ICA origin and to a lesser extent the bulb. Subsequent stenosis is less than 50 % with respect to the distal vessel. Negative cervical right ICA otherwise aside from tortuosity. Left carotid system: No left CCA stenosis despite soft and calcified plaque. Confluent calcified plaque at the posterior left ICA origin. Subsequent stenosis is less than 50 % with respect to the distal vessel. Negative cervical left ICA otherwise. Vertebral arteries: No proximal left subclavian artery stenosis despite soft and calcified plaque. Left vertebral artery origin without stenosis despite soft plaque. Calcified plaque in the left V1, V2, and V3 segments with no hemodynamically significant stenosis. No proximal right subclavian artery stenosis despite calcified plaque. That plaque does affect the right vertebral artery origin with mild stenosis. There is calcified plaque in the right V1 segment with additional mild stenosis. Mild right V3 segment calcified plaque. No right vertebral artery stenosis to the skullbase. CTA HEAD Posterior circulation: Extensive calcified plaque in the bilateral vertebral artery V4 segments. Mild to moderate bilateral distal vertebral artery stenosis but preserved patency of the vertebrobasilar junction. No basilar artery stenosis. SCA and PCA origins are normal. Posterior communicating arteries are  diminutive or absent. The left PCA P2 segment is moderately irregular, but there is preserved distal flow (series 5015, image 20). The right PCA branches are mildly irregular. Anterior circulation: Both ICA siphons are patent with extensive bilateral calcified atherosclerosis. Up to mild left siphon stenosis occurs in the proximal supraclinoid segment. On the right there is mild siphon stenosis at the proximal cavernous segment, it and the proximal supraclinoid segment. Both carotid termini remain patent. Normal MCA and ACA origins. Diminutive or absent anterior communicating artery. Bilateral ACA branches are within normal limits. Left MCA M1 segment, bifurcation, and left MCA branches are patent. There is mild irregularity at the left bifurcation and in the left MCA branches. Right MCA M1 segment, bifurcation, and right MCA branches remain patent. There is mild irregularity at the bifurcation and in the right MCA branches. Venous sinuses: Patent. Anatomic variants: None. Delayed phase: Round homogeneously enhancing 16 mm diameter lesion of the interhemispheric fissure (series 601, image 25) corresponds to the hyperdense lesion described yesterday and appears stable. Minimal to mild associated mass effect with no definite cerebral edema. No other abnormal intracranial enhancement. IMPRESSION: 1. Negative for emergent large vessel occlusion. 2. Extensive atherosclerosis, but no anterior circulation hemodynamically significant stenosis. There are mild bilateral ICA siphon stenoses. 3. Up to moderate stenoses of the bilateral vertebral arteries and left PCA P2 segment in the posterior circulation. 4. No acute or evolving cortically based infarct identified. Advanced white matter signal changes compatible with chronic small vessel disease. 5. Intubated. Small volume retained secretions in the trachea. Partially visible patchy and nodular left upper lung opacity is new since 05/07/2016 compatible with infectious or  inflammatory process there is an associated moderate size layering left pleural effusion. Underlying emphysema. 6. Parafalcine 16 mm meningioma at the level of the cingulate gyri with no significant mass effect and no definite cerebral edema. Electronically Signed  By: Genevie Ann M.D.   On: 06/09/2016 15:54   Dg Chest Portable 1 View  06/11/2016  CLINICAL DATA:  Acute respiratory failure, shortness of breath. EXAM: PORTABLE CHEST 1 VIEW COMPARISON:  Chest x-rays dated 06/10/2016 in 05/2012 2017. FINDINGS: Endotracheal tube is well positioned with tip just above the level of the carina. Enteric tube passes below the diaphragm. Left chest wall pacemaker/ ICD appears stable in position. Cardiomediastinal silhouette is stable in size and configuration. Lungs appear stable compared to yesterday's chest x-ray. There is improved aeration at the left lung base compared to the exam of 06/09/2016. Mild central pulmonary vascular congestion persists. Lungs are again hyperexpanded suggesting COPD. Probable associated chronic bronchitic changes centrally. No pleural effusion or pneumothorax seen. IMPRESSION: Lungs appear stable compared to yesterday's chest x-ray. Persistent mild central pulmonary vascular congestion. Hyperexpanded lungs suggesting COPD. Suspect associated chronic bronchitic changes centrally. Endotracheal tube well positioned with tip just above the level of the carina. Electronically Signed   By: Franki Cabot M.D.   On: 06/11/2016 07:40   Dg Chest Port 1 View  06/10/2016  CLINICAL DATA:  Hypoxia EXAM: PORTABLE CHEST 1 VIEW COMPARISON:  June 09, 2016 FINDINGS: Endotracheal tube tip is 3.2 cm above the carina. Nasogastric tube tip and side port are in the stomach. Pacemaker leads are attached to the right atrium and right ventricle. There is no demonstrable pneumothorax. There has been interval clearing of infiltrate from the left base with only mild left base atelectasis remaining. There is mild perihilar  interstitial edema, slightly less compared to 1 day prior. No new opacity. Lungs are hyperexpanded. Heart size and pulmonary vascularity are within normal limits. No adenopathy evident. IMPRESSION: Evidence of clearing of infiltrate from left base as well as partial but incomplete clearing of perihilar interstitial edema. There is residual atelectasis in the left base and mild perihilar interstitial edema currently. No new opacity. No change in cardiac silhouette. Tube positions as described without pneumothorax. Electronically Signed   By: Lowella Grip III M.D.   On: 06/10/2016 07:04   Dg Chest Port 1 View  06/09/2016  CLINICAL DATA:  Respiratory failure. EXAM: PORTABLE CHEST 1 VIEW COMPARISON:  06/08/2016. FINDINGS: Endotracheal tube is 2.2 cm above the carina. Cardiac pacer with lead tips in right atrium right ventricle. Right perihilar and left lower lobe infiltrates are present. Small left-sided pleural effusion. No pneumothorax. Surgical clips upper abdomen. IMPRESSION: 1.  Endotracheal tube 2.2 cm above the carina. 2. Right perihilar and diffuse left lung infiltrates. Small left pleural effusion. 3. Cardiac pacer stable position.  Heart size stable. Electronically Signed   By: Marcello Moores  Register   On: 06/09/2016 07:01   Dg Chest Port 1 View  06/08/2016  CLINICAL DATA:  Endotracheal tube placement. EXAM: PORTABLE CHEST 1 VIEW COMPARISON:  06/08/2016 FINDINGS: New endotracheal tube tip projects 5.7 cm above the carina. Cardiac silhouette is borderline enlarged. No gross mediastinal or hilar masses. Lungs are hyperexpanded with irregularly thickened interstitial markings. Hazy airspace opacification is noted on the left most evident centrally accentuated by rotation to the right. This is similar to the earlier exam. Left-sided pacemaker is stable. IMPRESSION: 1. New endotracheal tip projects 5.7 cm above the carina. 2. No significant change in the appearance of the lungs. Findings may reflect a  combination of interstitial and mild hazy airspace edema from congestive heart failure. There is underlying COPD. Electronically Signed   By: Lajean Manes M.D.   On: 06/08/2016 18:39   Dg Chest  Port 1 View  06/08/2016  CLINICAL DATA:  Unresponsive with left-sided weakness following pacemaker placement. EXAM: PORTABLE CHEST 1 VIEW COMPARISON:  Radiographs 04/04/2010.  CT 05/07/2016. FINDINGS: 1727 hours. Left subclavian pacemaker leads project over the right atrium and right ventricle. The heart size and mediastinal contours are stable allowing for patient rotation to the right. There is diffuse aortic atherosclerosis. There is vascular congestion with new asymmetric perihilar and lower lobe airspace disease on the left. No pneumothorax or significant pleural effusion identified. The bones appear unchanged. IMPRESSION: 1. The left subclavian pacemaker leads appear satisfactorily positioned in the right atrium and right ventricle. No pneumothorax. 2. New vascular congestion with asymmetric airspace disease on the left consistent with asymmetric edema or aspiration. Electronically Signed   By: Richardean Sale M.D.   On: 06/08/2016 17:44   Dg Abd Portable 1v  06/09/2016  CLINICAL DATA:  Nasogastric tube placement. EXAM: PORTABLE ABDOMEN - 1 VIEW COMPARISON:  CT 05/07/16 FINDINGS: Nasogastric tube tip is at the level of the stomach. Epigastric sutures and clips related to previous stomach surgery. Contrast in the urinary collecting system related to recent CTA. Lung bases are grossly clear. Nonobstructive bowel gas pattern. Lumbar scoliosis. IMPRESSION: Nasogastric tube with tip at the stomach. Electronically Signed   By: Monte Fantasia M.D.   On: 06/09/2016 18:54     Assessment and Plan  89F with HTN, COPD, IDA, polycythemia, tobacco abuse, prior stomach ulcers, HTN, lung nodules, thyroid nodule, PAD presented to [facility] for planned PV angio. Recent CTA underwent CTA of the chest and abdomen without finding of  malignancy to explain wt loss, but with findings of thyroid nodule (previously known), lung nodules, atherosclerotic aortic and coronary dzs, SMA dzs, and an occluded L SFA. With claudication symptoms and PAD, she was seen by vascular surgery and subsequently set up for peripheral angiography but was found to be in rapid atrial fib as well as atrial flutter with rates 140's at times. Treated with AVN blocking agents then developed bradycardia HR 50s & pauses of almost 4 seconds, with episodic RVR. Txf to [facility] -> underwent St[facility] Jude PPM 06/08/16 but went unresponsive and left hemiparetic after procedure, s/p intubation. Suspected embolic CVA not seen on CT (unable to have MRI due to pacer). Heparin was held pre-PPM, restarted 06/09/16. Treated for aspiration PNA by PCCM. Developed recurrent PAF/flutter RVR requiring initiation of amio.  1. PAF/flutter with tachybrady syndrome s/p St. Jude PPM 06/08/16 - continue amiodarone at present dose for now and consider decreasing to 400mg  daily on 6/22 if no recurrent AF. F/u LFTs in AM given mild transaminitis.  2. Suspected stroke - now that she is on apixaban, will d/c ASA per neuro recs.  3. Unintentional weight loss - recently felt possibly due to mesenteric ischemia but no post-prandial pain, PCCM questions COPD-cachexia. Will need to f/u with vascular surgery to review timing of the previously intended PV angio. Will also need f/u per PCP of thyroid and pulm nodules.   4. Acute resp failure with AECOPD, possible asp PNA - today is last dose of Levaquin per PCCM. Cardiology rounding note yesterday suggests f/u CXR, not ordered. Will defer to MD. PCCM to f/u 6/19 to review plan for steroid taper.  5. HTN - per neuro note, allow permissive HTN <220/120 for 5-7 days post stroke (6/18-6/20). If BP remains elevated would consider increasing amlodipine to 10mg  daily.  Signed, Melina Copa PA-C Pager: [phone]   Doing well Telemetry with NSR and A pacing on  amiodarone Agree with stopping ASA now on eliquis.   PPM pocket ok.  Breathing better f/u CXR in am   Jenkins Rouge

## 2016-06-15 ENCOUNTER — Inpatient Hospital Stay (HOSPITAL_COMMUNITY): Payer: Medicare HMO

## 2016-06-15 DIAGNOSIS — I10 Essential (primary) hypertension: Secondary | ICD-10-CM

## 2016-06-15 DIAGNOSIS — J9600 Acute respiratory failure, unspecified whether with hypoxia or hypercapnia: Secondary | ICD-10-CM | POA: Insufficient documentation

## 2016-06-15 LAB — BASIC METABOLIC PANEL
Anion gap: 6 (ref 5–15)
BUN: 32 mg/dL — AB (ref 6–20)
CO2: 30 mmol/L (ref 22–32)
CREATININE: 0.93 mg/dL (ref 0.44–1.00)
Calcium: 8.4 mg/dL — ABNORMAL LOW (ref 8.9–10.3)
Chloride: 98 mmol/L — ABNORMAL LOW (ref 101–111)
GFR calc Af Amer: >60 mL/min (ref >60)
GFR calc non Af Amer: 54 mL/min — ABNORMAL LOW (ref >60)
Glucose, Bld: 87 mg/dL (ref 65–99)
POTASSIUM: 4.3 mmol/L (ref 3.5–5.1)
SODIUM: 134 mmol/L — AB (ref 135–145)

## 2016-06-15 LAB — HEPATIC FUNCTION PANEL
ALT: 21 U/L (ref 14–54)
AST: 16 U/L (ref 15–41)
Albumin: 2.4 g/dL — ABNORMAL LOW (ref 3.5–5.0)
Alkaline Phosphatase: 63 U/L (ref 38–126)
BILIRUBIN INDIRECT: 0.2 mg/dL — AB (ref 0.3–0.9)
Bilirubin, Direct: 0.1 mg/dL (ref 0.1–0.5)
TOTAL PROTEIN: 4.7 g/dL — AB (ref 6.5–8.1)
Total Bilirubin: 0.3 mg/dL (ref 0.3–1.2)

## 2016-06-15 LAB — CBC
HEMATOCRIT: 37.9 % (ref 36.0–46.0)
HEMOGLOBIN: 12 g/dL (ref 12.0–15.0)
MCH: 26.7 pg (ref 26.0–34.0)
MCHC: 31.7 g/dL (ref 30.0–36.0)
MCV: 84.2 fL (ref 78.0–100.0)
Platelets: 226 10*3/uL (ref 150–400)
RBC: 4.5 MIL/uL (ref 3.87–5.11)
RDW: 26.9 % — ABNORMAL HIGH (ref 11.5–15.5)
WBC: 8.4 10*3/uL (ref 4.0–10.5)

## 2016-06-15 MED ORDER — PREDNISONE 20 MG PO TABS
20.0000 mg | ORAL_TABLET | Freq: Every day | ORAL | Status: DC
  Administered 2016-06-16: 20 mg via ORAL
  Filled 2016-06-15: qty 1

## 2016-06-15 NOTE — Progress Notes (Signed)
SUBJECTIVE: The patient is stable today.  At this time, she denies chest pain, shortness of breath, or any new concerns. Still weak, has worked with PT but does not feel like she could get up alone.  CURRENT MEDICATIONS: . amiodarone  400 mg Oral BID  . amLODipine  5 mg Oral Daily  . antiseptic oral rinse  7 mL Mouth Rinse BID  . apixaban  2.5 mg Oral BID  . arformoterol  15 mcg Nebulization BID  . atorvastatin  10 mg Oral q1800  . budesonide (PULMICORT) nebulizer solution  0.5 mg Nebulization BID  . feeding supplement (ENSURE ENLIVE)  237 mL Oral BID BM  . multivitamin  1 tablet Oral Daily  . pantoprazole  40 mg Oral Q1200  . predniSONE  30 mg Oral Q breakfast      OBJECTIVE: Physical Exam: Filed Vitals:   06/14/16 1119 06/14/16 2045 06/14/16 2051 06/15/16 0515  BP: 139/69  143/62 157/82  Pulse:   60 60  Temp: 97.4 F (36.3 C)  97.9 F (36.6 C) 98 F (36.7 C)  TempSrc:   Oral Oral  Resp:   20 20  Height: 5\' 5"  (1.651 m)     Weight: 89 lb 11.2 oz (40.688 kg)   90 lb 13.3 oz (41.2 kg)  SpO2: 93% 96% 99% 99%    Intake/Output Summary (Last 24 hours) at 06/15/16 0836 Last data filed at 06/15/16 0516  Gross per 24 hour  Intake    540 ml  Output   1460 ml  Net   -920 ml    Telemetry reveals atrial pacing with intrinsic ventricular conduction   GEN- The patient is well appearing, alert and oriented x 3 today.   Head- normocephalic, atraumatic Eyes-  Sclera clear, conjunctiva pink Ears- hearing intact Oropharynx- clear Neck- supple, no JVP Lymph- no cervical lymphadenopathy Lungs- Clear to ausculation bilaterally, normal work of breathing Heart- Regular rate and rhythm, no murmurs, rubs or gallops, PMI not laterally displaced GI- soft, NT, ND, + BS Extremities- no clubbing, cyanosis, or edema Skin- no rash or lesion Psych- euthymic mood, full affect Neuro- strength and sensation are intact  LABS: Basic Metabolic Panel:  Recent Labs  06/15/16 0414  NA  134*  K 4.3  CL 98*  CO2 30  GLUCOSE 87  BUN 32*  CREATININE 0.93  CALCIUM 8.4*   Liver Function Tests:  Recent Labs  06/15/16 0414  AST 16  ALT 21  ALKPHOS 63  BILITOT 0.3  PROT 4.7*  ALBUMIN 2.4*   CBC:  Recent Labs  06/15/16 0414  WBC 8.4  HGB 12.0  HCT 37.9  MCV 84.2  PLT 226    RADIOLOGY: Ct Angio Head W/cm &/or Wo Cm 06/09/2016  CLINICAL DATA:  82 year old female status post pacemaker placement yesterday with acute onset left side weakness. Initial encounter. EXAM: CT ANGIOGRAPHY HEAD AND NECK TECHNIQUE: Multidetector CT imaging of the head and neck was performed using the standard protocol during bolus administration of intravenous contrast. Multiplanar CT image reconstructions and MIPs were obtained to evaluate the vascular anatomy. Carotid stenosis measurements (when applicable) are obtained utilizing NASCET criteria, using the distal internal carotid diameter as the denominator. CONTRAST:  50 mL Isovue 370 COMPARISON:  Head CT without contrast 06/08/2016. Chest CT 05/07/2016. FINDINGS: CT HEAD Brain: Stable cerebral volume. No acute intracranial hemorrhage identified. No acute or evolving cortically based infarct identified. Patchy and confluent bilateral white matter hypodensity appears stable and is nonspecific. Deep gray  matter nuclei appear stable and within normal limits. Posterior fossa gray-white matter differentiation appears stable. Calvarium and skull base: Stable and intact. Paranasal sinuses: Stable and well pneumatized visualized paranasal sinuses and mastoids. Orbits: No acute orbit or scalp soft tissue findings. CTA NECK Skeleton: Degenerative changes in the lower cervical spine including multilevel mild spondylolisthesis. Osteopenia. No acute osseous abnormality identified. Other neck: Intubated. Endotracheal tube tip terminates above the carina. Small volume retained secretions in the trachea. Mild fluid in the pharynx. Layering left pleural effusion,  likely moderate in size as fluid tracks to the left apex. Centrilobular emphysema in the visible lung parenchyma. Several small areas of patchy ground-glass opacity in the left upper lobe. There is a partially visible more solid-appearing 8 mm lung nodule on series 501, image 1 which is new since May. No superior mediastinal lymphadenopathy. Left thyroid goiter with dystrophic calcification and partial extension into the superior mediastinum. Negative superior parapharyngeal and retropharyngeal spaces. Sublingual space, submandibular glands and parotid glands are within normal limits. Cachexia. No cervical lymphadenopathy identified. Aortic arch: 3 vessel arch configuration with extensive calcified arch atherosclerosis. Right carotid system: No brachiocephalic artery or right CCA origin stenosis despite calcified plaque. No right CCA stenosis despite calcified plaque. Confluent calcified plaque at the right carotid bifurcation affecting the right ICA origin and to a lesser extent the bulb. Subsequent stenosis is less than 50 % with respect to the distal vessel. Negative cervical right ICA otherwise aside from tortuosity. Left carotid system: No left CCA stenosis despite soft and calcified plaque. Confluent calcified plaque at the posterior left ICA origin. Subsequent stenosis is less than 50 % with respect to the distal vessel. Negative cervical left ICA otherwise. Vertebral arteries: No proximal left subclavian artery stenosis despite soft and calcified plaque. Left vertebral artery origin without stenosis despite soft plaque. Calcified plaque in the left V1, V2, and V3 segments with no hemodynamically significant stenosis. No proximal right subclavian artery stenosis despite calcified plaque. That plaque does affect the right vertebral artery origin with mild stenosis. There is calcified plaque in the right V1 segment with additional mild stenosis. Mild right V3 segment calcified plaque. No right vertebral artery  stenosis to the skullbase. CTA HEAD Posterior circulation: Extensive calcified plaque in the bilateral vertebral artery V4 segments. Mild to moderate bilateral distal vertebral artery stenosis but preserved patency of the vertebrobasilar junction. No basilar artery stenosis. SCA and PCA origins are normal. Posterior communicating arteries are diminutive or absent. The left PCA P2 segment is moderately irregular, but there is preserved distal flow (series 5015, image 20). The right PCA branches are mildly irregular. Anterior circulation: Both ICA siphons are patent with extensive bilateral calcified atherosclerosis. Up to mild left siphon stenosis occurs in the proximal supraclinoid segment. On the right there is mild siphon stenosis at the proximal cavernous segment, it and the proximal supraclinoid segment. Both carotid termini remain patent. Normal MCA and ACA origins. Diminutive or absent anterior communicating artery. Bilateral ACA branches are within normal limits. Left MCA M1 segment, bifurcation, and left MCA branches are patent. There is mild irregularity at the left bifurcation and in the left MCA branches. Right MCA M1 segment, bifurcation, and right MCA branches remain patent. There is mild irregularity at the bifurcation and in the right MCA branches. Venous sinuses: Patent. Anatomic variants: None. Delayed phase: Round homogeneously enhancing 16 mm diameter lesion of the interhemispheric fissure (series 601, image 25) corresponds to the hyperdense lesion described yesterday and appears stable. Minimal to mild associated  mass effect with no definite cerebral edema. No other abnormal intracranial enhancement. IMPRESSION: 1. Negative for emergent large vessel occlusion. 2. Extensive atherosclerosis, but no anterior circulation hemodynamically significant stenosis. There are mild bilateral ICA siphon stenoses. 3. Up to moderate stenoses of the bilateral vertebral arteries and left PCA P2 segment in the  posterior circulation. 4. No acute or evolving cortically based infarct identified. Advanced white matter signal changes compatible with chronic small vessel disease. 5. Intubated. Small volume retained secretions in the trachea. Partially visible patchy and nodular left upper lung opacity is new since 05/07/2016 compatible with infectious or inflammatory process there is an associated moderate size layering left pleural effusion. Underlying emphysema. 6. Parafalcine 16 mm meningioma at the level of the cingulate gyri with no significant mass effect and no definite cerebral edema. Electronically Signed   By: Genevie Ann M.D.   On: 06/09/2016 15:54   Dg Chest 2 View 06/15/2016  CLINICAL DATA:  82 year old female with a history of hypoxia EXAM: CHEST  2 VIEW COMPARISON:  06/11/2016, 06/10/2016, 06/09/2016 FINDINGS: Cardiomediastinal silhouette unchanged in size and contour. Calcifications of aortic arch and aorta. Stigmata of emphysema, with increased retrosternal airspace, flattened hemidiaphragms, increased AP diameter, and hyperinflation on the AP view. Cardiac pacing device left chest wall with 2 leads in place. Interlobular septal thickening throughout. Portions of the lower thorax have been excluded on the lateral view, though there is blunting of the costophrenic sulcus. No pneumothorax. Nodular/airspace opacity in the bilateral hilar regions. IMPRESSION: Advanced emphysema with nodular hilar opacities and interstitial opacities suggesting a combination of edema and/ or superimposed infection. Aeration is similar to the comparison. Likely bilateral small pleural effusions. Atherosclerosis and cardiac pacing device unchanged. Signed, Dulcy Fanny. Earleen Newport, DO [facility] Electronically Signed   By: Corrie Mckusick D.O.   On: 06/15/2016 07:18   Ct Head Wo Contrast  06/08/2016  CLINICAL DATA:  Left-sided weakness EXAM: CT HEAD WITHOUT CONTRAST TECHNIQUE:  Contiguous axial images were obtained from the base of the skull through the vertex without intravenous contrast. COMPARISON:  None. FINDINGS: Bony calvarium is intact. No gross soft tissue abnormality is noted. Mild atrophic changes are noted as well as scattered areas of chronic white matter ischemic change. A hemangioma is noted along the falx near the vertex scattered basal ganglia calcifications are seen. IMPRESSION: Atrophic and chronic white matter ischemic changes. Hyperdense lesion involving the falx near the vertex likely representing a meningioma. This measures approximately 18 mm in greatest dimension. No acute abnormality is noted. These results were called by telephone at the time of interpretation on 06/08/2016 at 7:01 pm to Dr. Gifford Shave, who verbally acknowledged these results. Electronically Signed   By: Inez Catalina M.D.   On: 06/08/2016 19:03   ASSESSMENT AND PLAN:  Active Problems:   Chronic obstructive pulmonary disease (HCC)   HLD (hyperlipidemia)   Protein-calorie malnutrition, severe   Tachycardia-bradycardia syndrome (HCC)   Typical atrial flutter (HCC)   Sick sinus syndrome (HCC)   Respiratory failure (HCC)   Cerebrovascular accident (CVA) due to embolism of cerebral artery (HCC)   Pressure ulcer   Acute hypoxemic respiratory failure (HCC)   Pneumonia   COPD exacerbation (HCC)   Paroxysmal atrial fibrillation ([place])   Essential hypertension   Unintentional weight loss   1. PAF/flutter with tachybrady syndrome s/p [employer] PPM 06/08/16 - continue amiodarone at present dose for now and consider decreasing to 400mg  daily on 6/22 if no recurrent AF. LFTs  this morning normal  2. Suspected stroke - continue Eliquis, no ASA per neuro  3. Unintentional weight loss - recently felt possibly due to mesenteric ischemia but no post-prandial pain, PCCM questions COPD-cachexia. Will need to f/u with vascular surgery to review timing of the previously intended PV angio as an outpatient,  discussed with patient today. Will also need f/u per PCP of thyroid and pulm nodules.   4. Acute resp failure with AECOPD, possible asp PNA - Levaquin completed per PCCM. PCCM to see today to review plan for steroid taper.  5. HTN - per neuro note, allow permissive HTN <220/120 for 5-7 days post stroke (6/18-6/20). If BP remains elevated would consider increasing amlodipine to 10mg  daily.  M,uch better at this pont  Hopefully can discharge to SNF or home today or tomorrow. She is not able to get out of bed by herself and so SNF may be more appropriate. Will ask PT to reevaluate today DC foley  Chanetta Marshall, NP 06/15/2016 9:03 AM  Agree with above Seen and examined Will continue amiio 400 bid x 3 days then 400 qd x 2 weeks then 200 qd  Need PT eval   BP well controlled

## 2016-06-15 NOTE — Care Management Important Message (Signed)
Important Message  Patient Details  Name: Lydia [name] MRN: [id number] Date of Birth: Aug 16, 1927   Medicare Important Message Given:  Yes    Loann Quill 06/15/2016, 10:15 AM

## 2016-06-15 NOTE — Progress Notes (Signed)
PULMONARY / CRITICAL CARE MEDICINE   Name: Lydia [name] MRN: [id number] DOB: Feb 07, 1927    ADMISSION DATE:  06/08/2016 CONSULTATION DATE:  6/12  REFERRING MD:  Dr. Lovena Le EP  CHIEF COMPLAINT:  Unresponsive s/p pacemaker placement  HISTORY OF PRESENT ILLNESS:   82yo female with HTN, COPD, tachy-brady syndrome having a pacemaker placed 6/12 when she became unresponsive with L sided weaness.  Code stroke called and pt intubated and tx ICU.  Mental status improving.   SUBJECTIVE:  No acute change over weekend.  Remains weak.    VITAL SIGNS: BP 157/82 mmHg  Pulse 60  Temp(Src) 98 F (36.7 C) (Oral)  Resp 20  Ht 5\' 5"  (1.651 m)  Wt 41.2 kg (90 lb 13.3 oz)  BMI 15.11 kg/m2  SpO2 94%  HEMODYNAMICS:    VENTILATOR SETTINGS:    INTAKE / OUTPUT: I/O last 3 completed shifts: In: 34 [P.O.:540] Out: 2435 [Urine:2435]  PHYSICAL EXAMINATION: General:  Frail, cachetic, elderly female, NAD  Neuro: Awake and alert. Follows commands, nods appropriately, MAE, mild R sided weakness, but improving  HEENT: PERRL, mucous membranes dry Cardiovascular:  Afib/Aflutter with rate controlled. No JVD, No murmurs Lungs: posterior rhonchi, no accessory use  Abdomen: flat, + BS,  Musculoskeletal: moving all extremities to command,slightly weaker on right.  LABS:  BMET  Recent Labs Lab 06/11/16 1026 06/12/16 0327 06/15/16 0414  NA 134* 136 134*  K 3.9 4.2 4.3  CL 100* 102 98*  CO2 23 27 30   BUN 30* 30* 32*  CREATININE 0.84 0.73 0.93  GLUCOSE 173* 124* 87    Electrolytes  Recent Labs Lab 06/10/16 1625 06/10/16 2109 06/11/16 0457 06/11/16 1026 06/12/16 0327 06/15/16 0414  CALCIUM  --  9.1 9.2 9.0 9.1 8.4*  MG 1.7 1.8 1.9  --  1.9  --   PHOS 4.9*  --  3.9  --  3.7  --     CBC  Recent Labs Lab 06/11/16 0457 06/12/16 0327 06/15/16 0414  WBC 12.4* 12.5* 8.4  HGB 12.0 12.4 12.0  HCT 38.4 39.2 37.9  PLT 236 218 226    Coag's  Recent Labs Lab 06/08/16 1826   APTT 33  INR 1.14    Sepsis Markers  Recent Labs Lab 06/08/16 2011 06/09/16 1133 06/09/16 1510 06/10/16 0205 06/11/16 0457  LATICACIDVEN 2.1* 1.5 1.4  --   --   PROCALCITON  --  0.43  --  0.23 0.16    ABG  Recent Labs Lab 06/08/16 2031 06/10/16 0410  PHART 7.254* 7.487*  PCO2ART 63.9* 34.1*  PO2ART 478.0* 103.0*    Liver Enzymes  Recent Labs Lab 06/08/16 2052 06/15/16 0414  AST 54* 16  ALT 60* 21  ALKPHOS 112 63  BILITOT 1.1 0.3  ALBUMIN 2.9* 2.4*    Cardiac Enzymes No results for input(s): TROPONINI, PROBNP in the last 168 hours.  Glucose  Recent Labs Lab 06/10/16 2024 06/11/16 0045 06/11/16 0501 06/11/16 0808 06/11/16 1203 06/11/16 1552  GLUCAP 152* 150* 167* 142* 130* 100*    Imaging Dg Chest 2 View  06/15/2016  CLINICAL DATA:  82 year old female with a history of hypoxia EXAM: CHEST  2 VIEW COMPARISON:  06/11/2016, 06/10/2016, 06/09/2016 FINDINGS: Cardiomediastinal silhouette unchanged in size and contour. Calcifications of aortic arch and aorta. Stigmata of emphysema, with increased retrosternal airspace, flattened hemidiaphragms, increased AP diameter, and hyperinflation on the AP view. Cardiac pacing device left chest wall with 2 leads in place. Interlobular septal thickening throughout. Portions of the  lower thorax have been excluded on the lateral view, though there is blunting of the costophrenic sulcus. No pneumothorax. Nodular/airspace opacity in the bilateral hilar regions. IMPRESSION: Advanced emphysema with nodular hilar opacities and interstitial opacities suggesting a combination of edema and/ or superimposed infection. Aeration is similar to the comparison. Likely bilateral small pleural effusions. Atherosclerosis and cardiac pacing device unchanged. Signed, Dulcy Fanny. Earleen Newport, DO [facility] Electronically Signed   By: Corrie Mckusick D.O.   On: 06/15/2016 07:18      STUDIES: 6/13: CXR>>Right perihilar and left lower lobe infiltrates are present. 6/12: CT Brain>> Hyperdense lesion involving the falx near the vertex representing meningioma, 18 mm 6/13: CTA Brain >  Negative for emergent large vessel occlusion. xtensive atherosclerosis, but no anterior circulation hemodynamically significant stenosis. There are mild bilateral ICA siphon stenoses. Up to moderate stenoses of the bilateral vertebral arteries and left PCA P2 segment in the posterior circulation. No acute or evolving cortically based infarct identified. Advanced white matter signal changes compatible with chronic small vessel disease. Partially visible patchy and nodular left upper lung opacity there is an associated moderate size layering left pleural effusion. Underlying emphysema. Parafalcine 16 mm meningioma at the level of the cingulate gyri with no significant mass effect and no definite cerebral edema.  CULTURES: 6/12: Sputum Culture>>NOF  ANTIBIOTICS: 6/12: Levaquin>>> 6/12 Vanc>>>6/15  SIGNIFICANT EVENTS: 6/12>> Pace Maker Placement 6/12>> Intubated Post Pace Placement 2/2 ? Stroke vs TIA  LINES/TUBES: 6/12: PIV 6/12: ETT>>6/15 6/12: Foley  DISCUSSION: Admitted to [facility] after becoming unresponsive after pacemaker placement. Code stroke initiated due to patient not moving left side.  She was intubated on arrival to [facility]. CT/CTA completed. Neurology consulted.CT head shows meningioma, but 6/13 . Now extubated. Looks comfortable. Will de-escalate pulmonary interventions. Change abx to oral for 7d total, change solumed to pred and taper over about 10d, wean O2, mobilize, add flutter.    ASSESSMENT / PLAN:  Acute Respiratory Failure - r/t AMS, AECOPD, ?aspiration PNA -->resolved. Extubated 6/15 COPD  Small L effusion on CT Plan:   Pulmonary hygiene  Wean solumedrol to pred and taper to off over next 5 days  Levaquin complete 6/19 outpt pulmonary follow up arranged in [place]    Hypertension Afib/Aflutter Tachy-brady syndrome s/p pacemaker  Plan:  Cardiology/EP primary Continue Amiodarone, norvasc   Maintain SBP< 180 eliquis   Protein calorie malnutrition  Weight Loss - Recent 50 Lb weight loss (Maybe 2/2 COPD cachexia) Plan   Adv diet  Nutritional following    Iron Deficiency Anemia Polycythemia Plan:  Followed by oncology F/u CBC   Stroke - r/t AFib  Incidental finding of Meningioma 6/12 Plan:   Neurology following  Supportive care  Heparin gtt as above   PCCM signing off, will f/u as outpt.   Nickolas Madrid, NP 06/15/2016  10:49 AM Pager: [phone] or [phone]   STAFF NOTE: I, Merrie Roof, MD FACP have personally reviewed patient's available data, including medical history, events of note, physical examination and test results as part of my evaluation. I have discussed with resident/NP and other care providers such as pharmacist, RN and RRT. In addition, I personally evaluated patient and elicited key findings of: in chair, wet cough, no fevers, no distress, int ronchi, no bronchospasm, needs suctioning prn and enhance cough and secretion control, reduce steroids, has been treated for asp event, still have concerns about her upper airway secretion control, will need folow up with pulm which we made  Will SO Lavon Paganini. Titus Mould, MD, [place] Pgr: [facility] 06/15/2016 2:50 PM

## 2016-06-15 NOTE — NC FL2 (Signed)
[place] LEVEL OF CARE SCREENING TOOL     IDENTIFICATION  Patient Name: Lydia [name] Birthdate: 1927-05-28 Sex: female Admission Date (Current Location): 06/08/2016  [facility] and [place] Number:  Engineering geologist and Address:  [facility], [place] [address] [place], [place] [address]      Provider Number: [id number]  Attending Physician Name and Address:  Evans Lance, MD  Relative Name and Phone Number:       Current Level of Care: Hospital Recommended Level of Care: [place] Prior Approval Number:    Date Approved/Denied:   PASRR Number: [id number]  Discharge Plan: SNF    Current Diagnoses: Patient Active Problem List   Diagnosis Date Noted  . Essential hypertension 06/14/2016  . Unintentional weight loss 06/14/2016  . Paroxysmal atrial fibrillation (HCC)   . Pressure ulcer 06/09/2016  . Acute hypoxemic respiratory failure ([place])   . Pneumonia   . COPD exacerbation ([place])   . Sick sinus syndrome ([place]) 06/08/2016  . Typical atrial flutter ([place])   . Sinus pause   . Chronic bronchitis ([place])   . Smoker unmotivated to quit   . Respiratory failure ([place])   . Cerebrovascular accident (CVA) due to embolism of cerebral artery ([place])   . Tachycardia-bradycardia syndrome ([place]) 06/06/2016  . New onset atrial fibrillation ([place]) 06/05/2016  . Protein-calorie malnutrition, severe 06/05/2016  . Degeneration of intervertebral disc of lumbar region 04/01/2015  . H/O adenomatous polyp of colon 10/03/2014  . Neuritis or radiculitis due to rupture of lumbar intervertebral disc 07/11/2014  . Lumbar canal stenosis 07/11/2014  . Chronic obstructive pulmonary disease ([place]) 05/25/2014  . Blood glucose elevated 05/25/2014  . HLD (hyperlipidemia) 05/25/2014  . Anemia, iron deficiency 05/25/2014  . Osteoporosis, post-menopausal 05/25/2014  . Acquired polycythemia 05/25/2014    Orientation RESPIRATION BLADDER Height & Weight      Self, Time, Situation, Place  Normal Continent, Indwelling catheter Weight: 90 lb 13.3 oz (41.2 kg) Height:  5\' 5"  (165.1 cm) (5;5")  BEHAVIORAL SYMPTOMS/MOOD NEUROLOGICAL BOWEL NUTRITION STATUS   (None)  (None) Continent Diet (Heart healthy)  AMBULATORY STATUS COMMUNICATION OF NEEDS Skin   Limited Assist Verbally Other (Comment), PU Stage and Appropriate Care, Surgical wounds (Ecchymosis bilateral arm, [place] bilateral leg, Skin tear right lower foot. Closed incision left chest.) PU Stage 1 Dressing:  (Medial sacrum. Foam every 3 days.)                     Personal Care Assistance Level of Assistance  Bathing, Feeding, Dressing Bathing Assistance: Limited assistance Feeding assistance: Independent Dressing Assistance: Limited assistance     Functional Limitations Info  Sight, Hearing, Speech Sight Info: Adequate Hearing Info: Adequate Speech Info: Adequate    SPECIAL CARE FACTORS FREQUENCY  PT (By licensed PT), Blood pressure     PT Frequency: 5 x week              Contractures Contractures Info: Not present    Additional Factors Info  Code Status, Allergies Code Status Info: Full Allergies Info: Penicillins           Current Medications (06/15/2016):  This is the current hospital active medication list Current Facility-Administered Medications  Medication Dose Route Frequency Provider Last Rate Last Dose  . amiodarone (PACERONE) tablet 400 mg  400 mg Oral BID Deboraha Sprang, MD   400 mg at 06/15/16 0956  . amLODipine (NORVASC) tablet 5 mg  5 mg  Oral Daily Erlene Quan, PA-C   5 mg at 06/15/16 [id number]  . antiseptic oral rinse (CPC / CETYLPYRIDINIUM CHLORIDE 0.05%) solution 7 mL  7 mL Mouth Rinse BID Evans Lance, MD   7 mL at 06/15/16 1000  . apixaban (ELIQUIS) tablet 2.5 mg  2.5 mg Oral BID Jake Church Masters, RPH   2.5 mg at 06/15/16 0956  . arformoterol (BROVANA) nebulizer solution 15 mcg  15 mcg Nebulization BID Corey Harold, NP   15 mcg at 06/15/16  0845  . atorvastatin (LIPITOR) tablet 10 mg  10 mg Oral q1800 Garvin Fila, MD   10 mg at 06/14/16 1753  . budesonide (PULMICORT) nebulizer solution 0.5 mg  0.5 mg Nebulization BID Jose Shirl Harris, MD   0.5 mg at 06/15/16 0846  . feeding supplement (ENSURE ENLIVE) (ENSURE ENLIVE) liquid 237 mL  237 mL Oral BID BM Chesley Mires, MD   237 mL at 06/15/16 1400  . hydrALAZINE (APRESOLINE) injection 5 mg  5 mg Intravenous Q6H PRN Erlene Quan, PA-C      . ipratropium-albuterol (DUONEB) 0.5-2.5 (3) MG/3ML nebulizer solution 3 mL  3 mL Nebulization Q6H PRN [name], NP      . multivitamin (PROSIGHT) tablet 1 tablet  1 tablet Oral Daily Wynelle Fanny, [facility]   1 tablet at 06/15/16 0956  . pantoprazole (PROTONIX) EC tablet 40 mg  40 mg Oral Q1200 Chesley Mires, MD   40 mg at 06/15/16 1224  . polyethylene glycol (MIRALAX / GLYCOLAX) packet 17 g  17 g Oral Daily PRN Corey Harold, NP   17 g at 06/15/16 0804  . [START ON 06/16/2016] predniSONE (DELTASONE) tablet 20 mg  20 mg Oral Q breakfast Marijean Heath, NP         Discharge Medications: Please see discharge summary for a list of discharge medications.  Relevant Imaging Results:  Relevant Lab Results:   Additional Information SS#: [id number]  Candie Chroman, LCSW    Mikle Bosworth.D.

## 2016-06-15 NOTE — Progress Notes (Signed)
Physical Therapy Treatment Patient Details Name: Lydia [name] MRN: [id number] DOB: [birth year] Today's Date: 06/15/2016    History of Present Illness pt is an 82 y/o female with h/o FTN, COPD, tachy-brady synd, s/p pacer placement 6/12 admitted after becoming unresponsive after procedure with L sided weakness.  Code stroke called, pt intubated and tx to ICU.  Now improving with less residual L sided weakness.    PT Comments    Appeared fatigued on entry, but participated well with there ex and gait with RW.  Very quick to fatigue with LE weakness, notably L Proximal musculature.  Follow Up Recommendations  SNF     Equipment Recommendations  Rolling walker with 5" wheels    Recommendations for Other Services       Precautions / Restrictions Precautions Precautions: Fall Restrictions Weight Bearing Restrictions: No    Mobility  Bed Mobility Overal bed mobility: Needs Assistance Bed Mobility: Supine to Sit     Supine to sit: Min guard;HOB elevated     General bed mobility comments: guard only  Transfers Overall transfer level: Needs assistance Equipment used: Rolling walker (2 wheeled) Transfers: Sit to/from Stand Sit to Stand: Min assist         General transfer comment: assist to come forward and stability once up  Ambulation/Gait Ambulation/Gait assistance: Min assist Ambulation Distance (Feet): 25 Feet Assistive device: Rolling walker (2 wheeled) Gait Pattern/deviations: Step-through pattern;Decreased step length - right;Decreased step length - left;Decreased stride length;Narrow base of support Gait velocity: slow Gait velocity interpretation: Below normal speed for age/gender General Gait Details: generally unsteady and biased to R side on the RW.  pt easy to assist, but noticeably fatigue with short distance.   Stairs            Wheelchair Mobility    Modified Rankin (Stroke Patients Only)       Balance Overall balance assessment: Needs  assistance Sitting-balance support: Single extremity supported;No upper extremity supported Sitting balance-Leahy Scale: Fair     Standing balance support: Bilateral upper extremity supported Standing balance-Leahy Scale: Poor                      Cognition Arousal/Alertness: Awake/alert Behavior During Therapy: WFL for tasks assessed/performed Overall Cognitive Status: Within Functional Limits for tasks assessed                      Exercises General Exercises - Lower Extremity Ankle Circles/Pumps: AROM;15 reps;Supine Quad Sets: AROM;Both;10 reps;Supine Gluteal Sets: AROM;Strengthening;Both;15 reps;Supine Heel Slides: AROM;Both;10 reps;Supine (resisted) Straight Leg Raises: AROM;Both;10 reps;Supine Other Exercises Other Exercises: non isolated elbow flexion and extension to get at strengthening the shd.    General Comments        Pertinent Vitals/Pain Pain Assessment: No/denies pain    Home Living                      Prior Function            PT Goals (current goals can now be found in the care plan section) Acute Rehab PT Goals Patient Stated Goal: get home with adequate assist PT Goal Formulation: With patient Time For Goal Achievement: 06/26/16 Potential to Achieve Goals: Good Progress towards PT goals: Progressing toward goals    Frequency  Min 2X/week    PT Plan Discharge plan needs to be updated    Co-evaluation             End of  Session   Activity Tolerance: Patient tolerated treatment well;Patient limited by fatigue Patient left: in chair;with call bell/phone within reach     Time: 1305-1340 PT Time Calculation (min) (ACUTE ONLY): 35 min  Charges:  $Gait Training: 8-22 mins $Therapeutic Exercise: 8-22 mins                    G Codes:      Pharaoh Pio, Tessie Fass 06/15/2016, 2:25 PM 06/15/2016  Donnella Sham, PT [phone]  (pager)

## 2016-06-15 NOTE — Clinical Social Work Note (Signed)
Clinical Social Work Assessment  Patient Details  Name: Lydia [name] MRN: [id number] Date of Birth: 23-Jul-1927  Date of referral:  06/15/16               Reason for consult:  Facility Placement, Discharge Planning                Permission sought to share information with:  Facility Sport and exercise psychologist, Family Supports Permission granted to share information::  Yes, Verbal Permission Granted  Name::     Engineer, maintenance::  SNF's  Relationship::  Spouse  Contact Information:  [phone]  Housing/Transportation Living arrangements for the past 2 months:  Single Family Home Source of Information:  Patient, Medical Team Patient Interpreter Needed:  None Criminal Activity/Legal Involvement Pertinent to Current Situation/Hospitalization:  No - Comment as needed Significant Relationships:  Spouse, Siblings Lives with:  Spouse Do you feel safe going back to the place where you live?  Yes Need for family participation in patient care:  Yes (Comment)  Care giving concerns:  PT now recommending SNF once medically stable for discharge.   Social Worker assessment / plan:  CSW met with patient. No supports at bedside. CSW introduced role and explained that discharge planning would be discussed. Patient aware of SNF recommendation. Discussed preferences. Patient anxious about not being home to take care of her husband. CSW provided emotional support. No further concerns. CSW encouraged patient to contact CSW as needed. CSW will continue to follow patient and facilitate discharge to SNF once medically stable.  Employment status:  Retired Nurse, adult PT Recommendations:  [place] / Referral to community resources:  [place]  Patient/Family's Response to care:  Patient agreeable to SNF placement. Patient's husband reportedly supportive and involved in patient's care but cannot drive back and forth to facilities. Patient  appreciated social work intervention.  Patient/Family's Understanding of and Emotional Response to Diagnosis, Current Treatment, and Prognosis:  Patient anxious about not being home to take care of her husband. CSW provided emotional support. Patient does understand need for rehab before returning home.  Emotional Assessment Appearance:  Appears stated age Attitude/Demeanor/Rapport:   (Pleasant) Affect (typically observed):  Accepting, Anxious, Calm, Appropriate, Pleasant Orientation:  Oriented to Self, Oriented to Place, Oriented to  Time, Oriented to Situation Alcohol / Substance use:  Tobacco Use Psych involvement (Current and /or in the community):  No (Comment)  Discharge Needs  Concerns to be addressed:  Care Coordination Readmission within the last 30 days:  Yes Current discharge risk:  Dependent with Mobility Barriers to Discharge:  [place], LCSW 06/15/2016, 3:22 PM

## 2016-06-15 NOTE — Evaluation (Signed)
Occupational Therapy Evaluation Patient Details Name: Lydia [name] MRN: [id number] DOB: 16-Nov-1927 Today's Date: 06/15/2016    History of Present Illness pt is an 82 y/o female with h/o FTN, COPD, tachy-brady synd, s/p pacer placement 6/12 admitted after becoming unresponsive after procedure with L sided weakness.  Code stroke called, pt intubated and tx to ICU.  Now improving with less residual L sided weakness.   Clinical Impression   Patient presenting with decreased I in self care, balance, coordination, strength, and functional mobility/transfers. Patient reports being I PTA. Patient currently functioning min - mod A overall. Patient will benefit from acute OT to increase overall independence in the areas of ADLs, functional mobility, and safety in order to safely discharge to next venue of care.    Follow Up Recommendations  SNF    Equipment Recommendations  Other (comment) (defer to next venue of care)    Recommendations for Other Services       Precautions / Restrictions Precautions Precautions: Fall Restrictions Weight Bearing Restrictions: No      Mobility Bed Mobility Overal bed mobility: Needs Assistance Bed Mobility: Supine to Sit     Supine to sit: Min guard;HOB elevated     General bed mobility comments: Pt seated in recliner chair upon entering the room  Transfers Overall transfer level: Needs assistance Equipment used: Rolling walker (2 wheeled) Transfers: Sit to/from Stand Sit to Stand: Min assist Stand pivot transfers: Min assist       General transfer comment: cues proper technique and assist with balance    Balance Overall balance assessment: Needs assistance Sitting-balance support: Single extremity supported;No upper extremity supported;Feet supported Sitting balance-Leahy Scale: Fair     Standing balance support: Bilateral upper extremity supported;During functional activity Standing balance-Leahy Scale: Poor Standing balance  comment: requires use of RW for stability        ADL Overall ADL's : Needs assistance/impaired     Grooming: Sitting;Set up;Wash/dry hands;Wash/dry face   Upper Body Bathing: Set up;Sitting   Lower Body Bathing: Sit to/from stand;Moderate assistance   Upper Body Dressing : Set up;Sitting   Lower Body Dressing: Moderate assistance;Sit to/from stand   Toilet Transfer: Minimal assistance;RW;Comfort height toilet   Toileting- Clothing Manipulation and Hygiene: Minimal assistance;Sit to/from stand       Functional mobility during ADLs: Minimal assistance General ADL Comments: Upon entering the room, pt seated in recliner chair with no c/o pain. Pt does report extreme fatigue. Pt able to functionally ambulate with RW and 10' in room towards sink but then needing seated rest break secondary to fatigue. Pt needing assist with RW advancement safely and min verbal cues for proper technique. Pt returning to recliner chair at end of session with chair alarm activated and call bell within reach.      Vision Vision Assessment?: No apparent visual deficits          Pertinent Vitals/Pain Pain Assessment: No/denies pain     Hand Dominance Right   Extremity/Trunk Assessment Upper Extremity Assessment Upper Extremity Assessment: Generalized weakness   Lower Extremity Assessment Lower Extremity Assessment: Generalized weakness   Cervical / Trunk Assessment Cervical / Trunk Assessment: Kyphotic   Communication Communication Communication: No difficulties   Cognition Arousal/Alertness: Awake/alert Behavior During Therapy: WFL for tasks assessed/performed Overall Cognitive Status: Within Functional Limits for tasks assessed             Exercises Exercises: General Lower Extremity;Other exercises Other Exercises Other Exercises: non isolated elbow flexion and extension to get at  strengthening the shd.        Home Living Family/patient expects to be discharged to:: Private  residence Living Arrangements: Spouse/significant other;Other relatives Available Help at Discharge: Family;Available 24 hours/day Type of Home: House Home Access: Stairs to enter [employer] of Steps: several Entrance Stairs-Rails: Right;Left Home Layout: One level     Bathroom Shower/Tub: Occupational psychologist: Standard     Home Equipment: Shower seat;Bedside commode          Prior Functioning/Environment Level of Independence: Independent        Comments: driving, running errands, but progressively with decreased endurance    OT Diagnosis: Acute pain;Hemiplegia non-dominant side   OT Problem List: Decreased strength;Decreased activity tolerance;Impaired balance (sitting and/or standing);Decreased coordination;Decreased safety awareness;Impaired UE functional use;Decreased knowledge of use of DME or AE   OT Treatment/Interventions: Self-care/ADL training;Therapeutic exercise;Energy conservation;DME and/or AE instruction;Manual therapy;Patient/family education;Therapeutic activities;Balance training    OT Goals(Current goals can be found in the care plan section) Acute Rehab OT Goals Patient Stated Goal: to get stronger and return home OT Goal Formulation: With patient Time For Goal Achievement: 06/29/16 Potential to Achieve Goals: Fair ADL Goals Pt Will Perform Grooming: with supervision Pt Will Perform Upper Body Bathing: with supervision Pt Will Perform Lower Body Bathing: with supervision Pt Will Perform Upper Body Dressing: with supervision Pt Will Perform Lower Body Dressing: with supervision Pt Will Transfer to Toilet: with supervision Pt Will Perform Toileting - Clothing Manipulation and hygiene: with supervision  OT Frequency: Min 2X/week    End of Session Equipment Utilized During Treatment: Rolling walker  Activity Tolerance: Patient limited by fatigue Patient left: in chair;with call bell/phone within reach;with chair alarm set    Time: 1350-1413 OT Time Calculation (min): 23 min Charges:  OT General Charges $OT Visit: 1 Procedure OT Evaluation $OT Eval Moderate Complexity: 1 Procedure OT Treatments $Self Care/Home Management : 8-22 mins G-Codes:    Phineas Semen, MS, OTR/L 06/15/2016, 3:20 PM

## 2016-06-16 ENCOUNTER — Encounter: Payer: Self-pay | Admitting: Internal Medicine

## 2016-06-16 MED ORDER — ARFORMOTEROL TARTRATE 15 MCG/2ML IN NEBU: 15.0000 ug | INHALATION_SOLUTION | Freq: Two times a day (BID) | RESPIRATORY_TRACT | Status: DC

## 2016-06-16 MED ORDER — BUDESONIDE 0.5 MG/2ML IN SUSP: 0.5000 mg | Freq: Two times a day (BID) | RESPIRATORY_TRACT | Status: AC

## 2016-06-16 MED ORDER — AMIODARONE HCL 400 MG PO TABS: 400.0000 mg | ORAL_TABLET | Freq: Two times a day (BID) | ORAL | Status: DC

## 2016-06-16 MED ORDER — IPRATROPIUM-ALBUTEROL 0.5-2.5 (3) MG/3ML IN SOLN: 3.0000 mL | Freq: Four times a day (QID) | RESPIRATORY_TRACT | Status: AC | PRN

## 2016-06-16 MED ORDER — BISACODYL 10 MG RE SUPP
10.0000 mg | Freq: Once | RECTAL | Status: AC
  Administered 2016-06-16: 10 mg via RECTAL
  Filled 2016-06-16: qty 1

## 2016-06-16 MED ORDER — APIXABAN 2.5 MG PO TABS: 2.5000 mg | ORAL_TABLET | Freq: Two times a day (BID) | ORAL | Status: AC

## 2016-06-16 MED ORDER — PREDNISONE 20 MG PO TABS: 20.0000 mg | ORAL_TABLET | Freq: Every day | ORAL | Status: DC

## 2016-06-16 MED ORDER — PREDNISONE 20 MG PO TABS: 20.0000 mg | ORAL_TABLET | ORAL | Status: DC

## 2016-06-16 MED ORDER — ATORVASTATIN CALCIUM 10 MG PO TABS: 10.0000 mg | ORAL_TABLET | Freq: Every day | ORAL | Status: AC

## 2016-06-16 NOTE — Clinical Social Work Placement (Signed)
   CLINICAL SOCIAL WORK PLACEMENT  NOTE  Date:  06/16/2016  Patient Details  Name: Lydia [name] MRN: [id number] Date of Birth: Mar 08, 1927  Clinical Social Work is seeking post-discharge placement for this patient at the [place] level of care (*CSW will initial, date and re-position this form in  chart as items are completed):  Yes   Patient/family provided with [place] Work Department's list of facilities offering this level of care within the geographic area requested by the patient (or if unable, by the patient's family).  Yes   Patient/family informed of their freedom to choose among providers that offer the needed level of care, that participate in Medicare, Medicaid or managed care program needed by the patient, have an available bed and are willing to accept the patient.  Yes   Patient/family informed of [place]'s ownership interest in [facility] and [facility], as well as of the fact that they are under no obligation to receive care at these facilities.  PASRR submitted to EDS on 06/15/16     PASRR number received on 06/15/16     Existing PASRR number confirmed on       FL2 transmitted to all facilities in geographic area requested by pt/family on 06/15/16     FL2 transmitted to all facilities within larger geographic area on       Patient informed that his/her managed care company has contracts with or will negotiate with certain facilities, including the following:        Yes   Patient/family informed of bed offers received.  Patient chooses bed at [facility]     Physician recommends and patient chooses bed at      Patient to be transferred to [facility] on 06/16/16.  Patient to be transferred to facility by [facility]     Patient family notified on 06/16/16 of transfer.  Name of family member notified:  Verl Bangs     PHYSICIAN Please prepare prescriptions     Additional Comment:     _______________________________________________ Candie Chroman, LCSW 06/16/2016, 12:01 PM

## 2016-06-16 NOTE — Progress Notes (Signed)
Pt has not had BM in a week.  NP notified and order placed for suppository.  RN gave suppository and pt attempting to have a BM.

## 2016-06-16 NOTE — Discharge Summary (Signed)
ELECTROPHYSIOLOGY PROCEDURE DISCHARGE SUMMARY    Patient ID: Lydia [name],  MRN: [id number], DOB/AGE: 1927-02-11 82 y.o.  Admit date: 06/08/2016 Discharge date: 06/16/2016  Primary Care Physician: Marinda Elk, MD Primary Cardiologist: Dr. Yvone Neu (new) Electrophysiologist: Dr. Caryl Comes  Primary Discharge Diagnosis:  1. Tachy-brady syndrome     S/p PPM this admission 2. Acute CVA 3. Respiratory failure     COPD exacerbation     Aspiration (treated) 4. PAFib (new)     CHA2DS2Vasc is at least 6, on Eliquis 5. PAD     Mesenteric disease     L SFA disease   Secondary Discharge Diagnosis:  1. Polycythemia     follows oncology out patient 2. HTN 3. Unexplained weight loss     Suspect secondary to #5 above, + COPD-cachexia  Allergies  Allergen Reactions  . Penicillins Hives    Has patient had a PCN reaction causing immediate rash, facial/tongue/throat swelling, SOB or lightheadedness with hypotension: Yes Has patient had a PCN reaction causing severe rash involving mucus membranes or skin necrosis: No Has patient had a PCN reaction that required hospitalization No Has patient had a PCN reaction occurring within the last 10 years. No If all of the above answers are "NO", then may proceed with Cephalosporin use.     Procedures This Admission:  1.  Implantation of a STJ dual chamber PPM on 06/08/16 by Dr Lovena Le.  The patient received a St. Jude dual-chamber pacemaker, serial number [id number] with St. Jude 52 cm active fixation pacing lead, serial number [id number] to the right ventricle. A St. Jude 45 cm active fixation pacing lead, serial [id number], right atrium. Post procedure: Her oxygen saturation was in the mid 80s and required ventilatory support. She was noted to have a decreased movement on her left side, and was poorly responsive. She was given 2 mg of Romazicon. She was taken to recovery area in guarded condition. Because of difficulty with oxygen saturation,  a stat portable chest x-ray was ordered. The CCM service was consulted. There was no obvious pneumothorax. The patient was more obviously left hemiparetic. The stroke service was consulted. 2.  CXR on 06/08/16 post procedure and 06/09/16 demonstrated no pneumothorax status post device implantation.       Device interrogation 06/08/16 shoed intact function 3. Endotracheal intubation 06/08/16 4. Extubation 06/11/16  Brief HPI: Lydia [name] is a 82 y.o. female was originally admitted to [facility] 06/05/16 with unexplained weight loss and planned for peripheral angiography and intervention for which she was at the facility for.  She was noted prior to the procedure to be in AFib with RVR and cardiology consulted.  During her treatment for this she was noted to have tachy-brady syndrome, her vascular intervention held until definitive treatment for her tachy-brady could be done. She was transferred to [facility] to undergo PPM implant.   Hospital Course:  The patient was initially treated with diltiazem gtt and heparin gtt with PAFib and pauses eventually noted up to 5 seconds and bradycardic rates into the 20's noted requiring holding of the diltiazem resulting in AFib RVR.  Echo noted EF 50-55%, small localized pericardial effusion, and no significant valvular disease.  oted that at [facility] the patient carried a DNR status.  06/08/16 it was decided with continued episodes of both PAFib and her SB and pauses to transfer to her to [facility] for PPM implant prior to pursuing vascular intervention, her heparin gtt held the morning of the procedure.  On 06/08/16  she underwent PPM implanant with details noted above.  The procedure complicated by respiratory insufficiency and left sided hemiparesis noted post implant.  The patient initially treated with romazicon with some improvement though had clear left sided deficit.   At this time, family was made aware of her condition by Dr. Lovena Le and CCM and status and family recinded her DNR status,  neurology was notified, code stroke was called the patient was intubated for airway protection and suspect aspiration.  The patient was started on antibiotics and pulmonary/CCM followed her throughout her stay, COPD exacerbation was also suspected noting the patient to be a heavy longstanding smoker.  She returned to SR overnight and started on amiodarone gtt.  Post procedure day #1 the patient neurological status had improved remarkably and was followed by neurology diagnosed with non-dominant R mid-brain infarct secondary to embolic event with AFib, recommended eventual Eliquis for a/c when able. She had some PAFib/flutter, eventually post extubation 06/11/16, rhythm remained in SR and was transitioned to PO amiodarone.  The patient's neurological status recovered and neurology signed off the case 06/12/16.  She was completed with a course of levaquin for suspect aspiration as well as treated with steroids by CCM/pulmonology for COPD.  CCM signed off the case with recommendations to wean her prednisone over 5 days and made follow arrangements with their service.  The current pulmonary regime otherwise will be continued.  The patient was evaluated by PT and OT and recommended SNF for rehabilitation.  Case management has been on the case/discussed with the patient and family who are agreeable with this plan.  The patient's PPM site is clean, dry and stable, the wound edges well approximated, no hematoma.  PPM check today shows intact function.  The patient was seen and examined today by Dr. Lovena Le who felt the patient is stable to discharge to SNF today.  Follow up with cardiology/EP has been arranged.   For the SNF: Pulmonology has recommended: Prednisone taper to off over next 5 days  needs suctioning prn and enhance cough and secretion control Follow up as scheduled  Discussed with pharmacist, recommends SNF to taper prednisone as follows: Day #1 Prednisone 20mg  PO one time today given already  in-patient Day #2  Prednisone 15mg  PO one time Day #3 Prednisone 10mg  PO one time Day #4 Prednisone 5mg  PO one time Day #5 Prednisone 2.5mg  PO one time   EP recommendations: continue amiodarone 400 bid x 2 more days then 400 qd x 2 weeks then 200 qd Follow up as scheduled  SNF please arrange for the patient out patient follow up as recommended with: Will need to f/u with vascular surgery (Dr. Delana Meyer) to review timing of the previously intended PV angio/interventions as an outpatient, will also need f/u per PCP of thyroid and pulm nodules.  Please assess and manage ongoing nutritional needs    Physical Exam: Filed Vitals:   06/15/16 1922 06/16/16 0500 06/16/16 0915 06/16/16 1000  BP: 130/59 153/68  145/53  Pulse: 60 65 62 63  Temp: 98.7 F (37.1 C) 97.7 F (36.5 C)  97.4 F (36.3 C)  TempSrc: Oral Oral  Oral  Resp: 18 18 18 20   Height:      Weight:  92 lb 13 oz (42.1 kg)    SpO2: 98% 98% 97% 95%    GEN- The patient is frail, well appearing, in NAD, alert and oriented x 3 today.   HEENT: normocephalic, atraumatic; sclera clear, conjunctiva pink; hearing intact; oropharynx clear; neck supple,  no JVP Lungs- normal work of breathing.  No wheezes, no rales, scattered rhonchi Heart- Regular rate and rhythm, no murmurs, rubs or gallops, PMI not laterally displaced GI- soft, non-tender, non-distended, bowel sounds present, no hepatosplenomegaly Extremities- no clubbing, cyanosis, or edema MS- no significant deformity or atrophy Skin- warm and dry, no rash or lesion, left chest without hematoma/ecchymosis Psych- euthymic mood, full affect Neuro- no gross deficits  PPM site is stable, wound is clean, dry, edges are well approximated, no erythema, edema or increased heat to the surrounding tissues, no hematoma.   Labs:   Lab Results  Component Value Date   WBC 8.4 06/15/2016   HGB 12.0 06/15/2016   HCT 37.9 06/15/2016   MCV 84.2 06/15/2016   PLT 226 06/15/2016     Recent  Labs Lab 06/15/16 0414  NA 134*  K 4.3  CL 98*  CO2 30  BUN 32*  CREATININE 0.93  CALCIUM 8.4*  PROT 4.7*  BILITOT 0.3  ALKPHOS 63  ALT 21  AST 16  GLUCOSE 87    Discharge Medications:    Medication List    STOP taking these medications        CALCIUM PO     metoprolol succinate 100 MG 24 hr tablet  Commonly known as:  TOPROL-XL     sucralfate 1 g tablet  Commonly known as:  CARAFATE     vitamin A 10000 UNIT capsule     vitamin C 1000 MG tablet     VITAMIN D PO      TAKE these medications        amiodarone 400 MG tablet  Commonly known as:  PACERONE  Take 1 tablet (400 mg total) by mouth 2 (two) times daily. 400mg  PO BID for 2 days then 400mg  PO QD for 2 weeks, then 200mg  PO QD     amLODipine 5 MG tablet  Commonly known as:  NORVASC  Take 5 mg by mouth daily.     apixaban 2.5 MG Tabs tablet  Commonly known as:  ELIQUIS  Take 1 tablet (2.5 mg total) by mouth 2 (two) times daily.     arformoterol 15 MCG/2ML Nebu  Commonly known as:  BROVANA  Take 2 mLs (15 mcg total) by nebulization 2 (two) times daily.     atorvastatin 10 MG tablet  Commonly known as:  LIPITOR  Take 1 tablet (10 mg total) by mouth daily at 6 PM.     budesonide 0.5 MG/2ML nebulizer solution  Commonly known as:  PULMICORT  Take 2 mLs (0.5 mg total) by nebulization 2 (two) times daily.     ipratropium-albuterol 0.5-2.5 (3) MG/3ML Soln  Commonly known as:  DUONEB  Take 3 mLs by nebulization every 6 (six) hours as needed.     omeprazole 20 MG capsule  Commonly known as:  PRILOSEC  Take 20 mg by mouth daily.     predniSONE 20 MG tablet  Commonly known as:  DELTASONE  Take 1 tablet (20 mg total) by mouth as directed. Taper to off as directed over 5 days        Disposition:  SNF Discharge Instructions    Diet - low sodium heart healthy    Complete by:  As directed      Increase activity slowly    Complete by:  As directed           Follow-up Information    Follow up  with Vilinda Boehringer, MD On 06/23/2016.  Specialty:  Internal Medicine   Why:  11:00am    Contact information:   [place] [address] [phone]       Follow up with Virl Axe, MD On 09/15/2016.   Specialty:  Cardiology   Why:  10:30AM   Contact information:   [place] [id number] [phone]       Duration of Discharge Encounter: Greater than 30 minutes including physician time.  Venetia Night, PA-C 06/16/2016 11:28 AM   EP Attending   Patient seen and examined. Agree with above. She is stable for transfer to SNF for rehab. Agree with amio taper as noted above. Usual EP followup. Dulcolax suppository for constipation.  Mikle Bosworth.D.

## 2016-06-16 NOTE — Clinical Social Work Note (Addendum)
CSW met with patient and let her know that [facility] is not in network with [employer]. Patient's brother told her that [facility] in [place] is a good facility. Discussed [facility] because they can usually get [place] authorization quickly. Patient wants to go to [facility] if they accept her. Florentina Jenny, admissions coordinator, notified and information sent over the hub.  Dayton Scrape, [place] [phone]  11:18 am [facility] extended bed offer. CSW told them to go ahead and start authorization. Hoyle Sauer from [facility] will come by hospital to start paperwork.  Dayton Scrape, [place] [phone]  11:29 am Patient can transport to [facility] after 3:00.  Dayton Scrape, [place]

## 2016-06-16 NOTE — Progress Notes (Signed)
Pt wanting RN to manually disimpact her, RN notified NP.  No new orders given, will encourage pt to keep trying.

## 2016-06-16 NOTE — Clinical Social Work Note (Signed)
CSW facilitated patient discharge including contacting patient family and facility to confirm patient discharge plans. Clinical information faxed to facility and family agreeable with plan. CSW arranged ambulance transport via [facility] to [facility]. RN to call report prior to discharge ([phone]).  CSW will sign off for now as social work intervention is no longer needed. Please consult us again if new needs arise.  Dhanush Jokerst, CSW [phone]   

## 2016-06-17 ENCOUNTER — Non-Acute Institutional Stay (SKILLED_NURSING_FACILITY): Payer: Medicare HMO | Admitting: Internal Medicine

## 2016-06-17 ENCOUNTER — Encounter: Payer: Self-pay | Admitting: Internal Medicine

## 2016-06-17 DIAGNOSIS — I4891 Unspecified atrial fibrillation: Secondary | ICD-10-CM | POA: Diagnosis not present

## 2016-06-17 DIAGNOSIS — K219 Gastro-esophageal reflux disease without esophagitis: Secondary | ICD-10-CM

## 2016-06-17 DIAGNOSIS — R5381 Other malaise: Secondary | ICD-10-CM

## 2016-06-17 DIAGNOSIS — I639 Cerebral infarction, unspecified: Secondary | ICD-10-CM

## 2016-06-17 DIAGNOSIS — E46 Unspecified protein-calorie malnutrition: Secondary | ICD-10-CM | POA: Diagnosis not present

## 2016-06-17 DIAGNOSIS — I495 Sick sinus syndrome: Secondary | ICD-10-CM | POA: Insufficient documentation

## 2016-06-17 DIAGNOSIS — I10 Essential (primary) hypertension: Secondary | ICD-10-CM

## 2016-06-17 DIAGNOSIS — J441 Chronic obstructive pulmonary disease with (acute) exacerbation: Secondary | ICD-10-CM

## 2016-06-17 DIAGNOSIS — E785 Hyperlipidemia, unspecified: Secondary | ICD-10-CM | POA: Diagnosis not present

## 2016-06-17 DIAGNOSIS — J9610 Chronic respiratory failure, unspecified whether with hypoxia or hypercapnia: Secondary | ICD-10-CM | POA: Diagnosis not present

## 2016-06-17 NOTE — Progress Notes (Signed)
LOCATION: [name]  PCP: Lydia Elk, MD   Code Status: DNR  Goals of care: Advanced Directive information Advanced Directives 06/17/2016  Does patient have an advance directive? Yes  Type of Advance Directive Out of facility DNR (pink MOST or yellow form)  Does patient want to make changes to advanced directive? No - Patient declined  Copy of advanced directive(s) in chart? Yes  Would patient like information on creating an advanced directive? -       Extended Emergency Contact Information Primary Emergency Contact: [name],Lydia F Address: [address]          [place], [place] [address] Johnnette Litter of [place] Phone: [phone] Relation: Spouse Secondary Emergency Contact: Lydia [name] [place] of [place] Phone: [phone] Relation: Brother   Allergies  Allergen Reactions  . Penicillins Hives    Has patient had a PCN reaction causing immediate rash, facial/tongue/throat swelling, SOB or lightheadedness with hypotension: Yes Has patient had a PCN reaction causing severe rash involving mucus membranes or skin necrosis: No Has patient had a PCN reaction that required hospitalization No Has patient had a PCN reaction occurring within the last 10 years. No If all of the above answers are "NO", then may proceed with Cephalosporin use.    Chief Complaint  Patient presents with  . New Admit To SNF    New Admission     HPI:  Patient is a 82 y.o. female seen today for short term rehabilitation post hospital admission from 06/08/16-06/16/16 with tachy-brady syndrome. She had a PPM placed on 06/08/16. Post surgery she had acute respiratory failure and required mechanical ventilation. She was treated with antibiotics for aspiration pneumonia. She alos had acute right mid brain infarct and was seen by neurology. She was seen by cardiology service for her afib. She is seen in her room today.   Review of Systems:  Constitutional: Negative  for fever, chills, diaphoresis. Feels weak and tired. HENT: Negative for headache, congestion, sore throat, difficulty swallowing. Positive for some nasal discharge.   Eyes: Negative for blurred vision, double vision and discharge.  Respiratory: Negative for cough, shortness of breath and wheezing.   Cardiovascular: Negative for chest pain, palpitations, leg swelling.  Gastrointestinal: Negative for heartburn, nausea, vomiting, abdominal pain. Positive for fair appetite. Last bowel movement was this morning. Has increased bowel frequency.  Genitourinary: Negative for dysuria and flank pain.  Musculoskeletal: Negative for back pain, fall in the facility.  Skin: Negative for itching, rash.  Neurological: Negative for dizziness. Psychiatric/Behavioral: Negative for depression   Past Medical History  Diagnosis Date  . Essential hypertension   . Osteoporosis   . Lower back pain   . History of stomach ulcers     a. s/p surgery ~ 40 yrs ago.  . Polycythemia     a. phlebotomy.  Marland Kitchen COPD (chronic obstructive pulmonary disease) ([place])   . IDA (iron deficiency anemia)     a. 04/2016 CT Abd: no malignancy.  . Weight loss, unintentional   . Thyroid nodule     a. 04/2016 large left thyroid nodule on CT chest  - rec dedicated u/s.  . Pulmonary nodule, left     a. 04/2016 CT chest: 72mm LUL nodule - f/u CT in 12 months (h/o smoking).  Marland Kitchen PAD (peripheral artery disease) ([place])     a. 04/2016 CT Abd: occluded prox LSFA.  . Superior mesenteric artery stenosis ([place])     a. 04/2016 CT Abd: atheromatous plaque and mural  thrombus in SMA.  . Tobacco abuse   . New onset atrial flutter ([place]) 05/2016  . Tachy-brady syndrome ([place]) 05/2016    a. to [facility] for PPM placement   Past Surgical History  Procedure Laterality Date  . Blepharoplasty    . Stomach surgery  1995    for ulcers  . Colonoscopy    . Cataract extraction w/phaco Right 01/27/2016    Procedure: CATARACT EXTRACTION PHACO AND INTRAOCULAR LENS PLACEMENT  (IOC);  Surgeon: Ronnell Freshwater, MD;  Location: [facility];  Service: Ophthalmology;  Laterality: Right;  . Colonoscopy  2005, 2010  . Ep implantable device N/A 06/08/2016    Procedure: Pacemaker Implant;  Surgeon: Evans Lance, MD;  Location: [place] [facility];  Service: Cardiovascular;  Laterality: N/A;   Social History:   reports that she has been smoking Cigarettes.  She has a 65 pack-year smoking history. She has never used smokeless tobacco. She reports that she does not drink alcohol or use illicit drugs.  Family History  Problem Relation Age of Onset  . Breast cancer Sister   . Other      no premature CAD    Medications:   Medication List       This list is accurate as of: 06/17/16 10:10 AM.  Always use your most recent med list.               amiodarone 400 MG tablet  Commonly known as:  PACERONE  Take 1 tablet (400 mg total) by mouth 2 (two) times daily. 400mg  PO BID for 2 days then 400mg  PO QD for 2 weeks, then 200mg  PO QD     amLODipine 5 MG tablet  Commonly known as:  NORVASC  Take 5 mg by mouth daily.     apixaban 2.5 MG Tabs tablet  Commonly known as:  ELIQUIS  Take 1 tablet (2.5 mg total) by mouth 2 (two) times daily.     arformoterol 15 MCG/2ML Nebu  Commonly known as:  BROVANA  Take 2 mLs (15 mcg total) by nebulization 2 (two) times daily.     atorvastatin 10 MG tablet  Commonly known as:  LIPITOR  Take 1 tablet (10 mg total) by mouth daily at 6 PM.     budesonide 0.5 MG/2ML nebulizer solution  Commonly known as:  PULMICORT  Take 2 mLs (0.5 mg total) by nebulization 2 (two) times daily.     ipratropium-albuterol 0.5-2.5 (3) MG/3ML Soln  Commonly known as:  DUONEB  Take 3 mLs by nebulization every 6 (six) hours as needed.     omeprazole 20 MG capsule  Commonly known as:  PRILOSEC  Take 20 mg by mouth daily.     predniSONE 20 MG tablet  Commonly known as:  DELTASONE  Take 1 tablet (20 mg total) by mouth as directed. Taper  to off as directed over 5 days        Immunizations: Immunization History  Administered Date(s) Administered  . Pneumococcal Polysaccharide-23 06/07/2016     Physical Exam: Filed Vitals:   06/17/16 1004  BP: 127/64  Pulse: 80  Temp: 98.7 F (37.1 C)  TempSrc: Oral  Resp: 21  Height: 5\' 8"  (1.727 m)  Weight: 98 lb (44.453 kg)  SpO2: 98%   Body mass index is 14.9 kg/(m^2).  General- elderly female, frail, thin built, in no acute distress Head- normocephalic, atraumatic Nose- no maxillary or frontal sinus tenderness, no nasal discharge Throat- moist mucus membrane Eyes- PERRLA,  EOMI, no pallor, no icterus, no discharge, normal conjunctiva, normal sclera Neck- no cervical lymphadenopathy Cardiovascular- normal s1,s2, no murmur, no leg edema Respiratory- bilateral clear to auscultation, + wheeze, no rhonchi, no crackles, no use of accessory muscles Abdomen- bowel sounds present, soft, non tender Musculoskeletal- able to move all 4 extremities, generalized weakness more prominent to her LUE and LLE, arthritis changes to her fingers, some swelling noted to her left arm Neurological- alert and oriented to person, place and time Skin- warm and dry, easy bruising, multiple skin tear, surgical incision to left upper chest wall healing well Psychiatry- normal mood and affect    Labs reviewed: Basic Metabolic Panel:  Recent Labs  06/10/16 1625 06/10/16 2109 06/11/16 0457 06/11/16 1026 06/12/16 0327 06/15/16 0414  NA  --  134* 136 134* 136 134*  K  --  2.9* 4.7 3.9 4.2 4.3  CL  --  97* 98* 100* 102 98*  CO2  --  28 28 23 27 30   GLUCOSE  --  137* 168* 173* 124* 87  BUN  --  29* 28* 30* 30* 32*  CREATININE  --  1.01* 0.83 0.84 0.73 0.93  CALCIUM  --  9.1 9.2 9.0 9.1 8.4*  MG 1.7 1.8 1.9  --  1.9  --   PHOS 4.9*  --  3.9  --  3.7  --    Liver Function Tests:  Recent Labs  04/16/16 1222 06/08/16 2052 06/15/16 0414  AST 28 54* 16  ALT 24 60* 21  ALKPHOS 54 112  63  BILITOT 0.4 1.1 0.3  PROT 6.5 5.8* 4.7*  ALBUMIN 3.8 2.9* 2.4*   No results for input(s): LIPASE, AMYLASE in the last 8760 hours. No results for input(s): AMMONIA in the last 8760 hours. CBC:  Recent Labs  05/15/16 1345  06/08/16 2052  06/10/16 0205 06/11/16 0457 06/12/16 0327 06/15/16 0414  WBC 5.5  < > 22.8*  < > 11.0* 12.4* 12.5* 8.4  NEUTROABS 4.6  --  21.4*  --  10.5*  --   --   --   HGB 13.8  < > 13.7  < > 11.8* 12.0 12.4 12.0  HCT 43.0  < > 43.1  < > 36.3 38.4 39.2 37.9  MCV 78.7*  < > 85.9  < > 83.4 83.1 85.6 84.2  PLT 287  < > 237  < > 190 236 218 226  < > = values in this interval not displayed. Cardiac Enzymes: No results for input(s): CKTOTAL, CKMB, CKMBINDEX, TROPONINI in the last 8760 hours. BNP: Invalid input(s): POCBNP CBG:  Recent Labs  06/11/16 0808 06/11/16 1203 06/11/16 1552  GLUCAP 142* 130* 100*    Radiological Exams: Ct Angio Head W/cm &/or Wo Cm  06/09/2016  CLINICAL DATA:  82 year old female status post pacemaker placement yesterday with acute onset left side weakness. Initial encounter. EXAM: CT ANGIOGRAPHY HEAD AND NECK TECHNIQUE: Multidetector CT imaging of the head and neck was performed using the standard protocol during bolus administration of intravenous contrast. Multiplanar CT image reconstructions and MIPs were obtained to evaluate the vascular anatomy. Carotid stenosis measurements (when applicable) are obtained utilizing NASCET criteria, using the distal internal carotid diameter as the denominator. CONTRAST:  50 mL Isovue 370 COMPARISON:  Head CT without contrast 06/08/2016. Chest CT 05/07/2016. FINDINGS: CT HEAD Brain: Stable cerebral volume. No acute intracranial hemorrhage identified. No acute or evolving cortically based infarct identified. Patchy and confluent bilateral white matter hypodensity appears stable and is nonspecific. Deep  gray matter nuclei appear stable and within normal limits. Posterior fossa gray-white matter  differentiation appears stable. Calvarium and skull base: Stable and intact. Paranasal sinuses: Stable and well pneumatized visualized paranasal sinuses and mastoids. Orbits: No acute orbit or scalp soft tissue findings. CTA NECK Skeleton: Degenerative changes in the lower cervical spine including multilevel mild spondylolisthesis. Osteopenia. No acute osseous abnormality identified. Other neck: Intubated. Endotracheal tube tip terminates above the carina. Small volume retained secretions in the trachea. Mild fluid in the pharynx. Layering left pleural effusion, likely moderate in size as fluid tracks to the left apex. Centrilobular emphysema in the visible lung parenchyma. Several small areas of patchy ground-glass opacity in the left upper lobe. There is a partially visible more solid-appearing 8 mm lung nodule on series 501, image 1 which is new since May. No superior mediastinal lymphadenopathy. Left thyroid goiter with dystrophic calcification and partial extension into the superior mediastinum. Negative superior parapharyngeal and retropharyngeal spaces. Sublingual space, submandibular glands and parotid glands are within normal limits. Cachexia. No cervical lymphadenopathy identified. Aortic arch: 3 vessel arch configuration with extensive calcified arch atherosclerosis. Right carotid system: No brachiocephalic artery or right CCA origin stenosis despite calcified plaque. No right CCA stenosis despite calcified plaque. Confluent calcified plaque at the right carotid bifurcation affecting the right ICA origin and to a lesser extent the bulb. Subsequent stenosis is less than 50 % with respect to the distal vessel. Negative cervical right ICA otherwise aside from tortuosity. Left carotid system: No left CCA stenosis despite soft and calcified plaque. Confluent calcified plaque at the posterior left ICA origin. Subsequent stenosis is less than 50 % with respect to the distal vessel. Negative cervical left ICA  otherwise. Vertebral arteries: No proximal left subclavian artery stenosis despite soft and calcified plaque. Left vertebral artery origin without stenosis despite soft plaque. Calcified plaque in the left V1, V2, and V3 segments with no hemodynamically significant stenosis. No proximal right subclavian artery stenosis despite calcified plaque. That plaque does affect the right vertebral artery origin with mild stenosis. There is calcified plaque in the right V1 segment with additional mild stenosis. Mild right V3 segment calcified plaque. No right vertebral artery stenosis to the skullbase. CTA HEAD Posterior circulation: Extensive calcified plaque in the bilateral vertebral artery V4 segments. Mild to moderate bilateral distal vertebral artery stenosis but preserved patency of the vertebrobasilar junction. No basilar artery stenosis. SCA and PCA origins are normal. Posterior communicating arteries are diminutive or absent. The left PCA P2 segment is moderately irregular, but there is preserved distal flow (series 5015, image 20). The right PCA branches are mildly irregular. Anterior circulation: Both ICA siphons are patent with extensive bilateral calcified atherosclerosis. Up to mild left siphon stenosis occurs in the proximal supraclinoid segment. On the right there is mild siphon stenosis at the proximal cavernous segment, it and the proximal supraclinoid segment. Both carotid termini remain patent. Normal MCA and ACA origins. Diminutive or absent anterior communicating artery. Bilateral ACA branches are within normal limits. Left MCA M1 segment, bifurcation, and left MCA branches are patent. There is mild irregularity at the left bifurcation and in the left MCA branches. Right MCA M1 segment, bifurcation, and right MCA branches remain patent. There is mild irregularity at the bifurcation and in the right MCA branches. Venous sinuses: Patent. Anatomic variants: None. Delayed phase: Round homogeneously enhancing  16 mm diameter lesion of the interhemispheric fissure (series 601, image 25) corresponds to the hyperdense lesion described yesterday and appears stable. Minimal to  mild associated mass effect with no definite cerebral edema. No other abnormal intracranial enhancement. IMPRESSION: 1. Negative for emergent large vessel occlusion. 2. Extensive atherosclerosis, but no anterior circulation hemodynamically significant stenosis. There are mild bilateral ICA siphon stenoses. 3. Up to moderate stenoses of the bilateral vertebral arteries and left PCA P2 segment in the posterior circulation. 4. No acute or evolving cortically based infarct identified. Advanced white matter signal changes compatible with chronic small vessel disease. 5. Intubated. Small volume retained secretions in the trachea. Partially visible patchy and nodular left upper lung opacity is new since 05/07/2016 compatible with infectious or inflammatory process there is an associated moderate size layering left pleural effusion. Underlying emphysema. 6. Parafalcine 16 mm meningioma at the level of the cingulate gyri with no significant mass effect and no definite cerebral edema. Electronically Signed   By: Genevie Ann M.D.   On: 06/09/2016 15:54   Dg Chest 2 View  06/15/2016  CLINICAL DATA:  82 year old female with a history of hypoxia EXAM: CHEST  2 VIEW COMPARISON:  06/11/2016, 06/10/2016, 06/09/2016 FINDINGS: Cardiomediastinal silhouette unchanged in size and contour. Calcifications of aortic arch and aorta. Stigmata of emphysema, with increased retrosternal airspace, flattened hemidiaphragms, increased AP diameter, and hyperinflation on the AP view. Cardiac pacing device left chest wall with 2 leads in place. Interlobular septal thickening throughout. Portions of the lower thorax have been excluded on the lateral view, though there is blunting of the costophrenic sulcus. No pneumothorax. Nodular/airspace opacity in the bilateral hilar regions. IMPRESSION:  Advanced emphysema with nodular hilar opacities and interstitial opacities suggesting a combination of edema and/ or superimposed infection. Aeration is similar to the comparison. Likely bilateral small pleural effusions. Atherosclerosis and cardiac pacing device unchanged. Signed, Dulcy Fanny. Earleen Newport, DO [facility] Electronically Signed   By: Corrie Mckusick D.O.   On: 06/15/2016 07:18   Ct Head Wo Contrast  06/08/2016  CLINICAL DATA:  Left-sided weakness EXAM: CT HEAD WITHOUT CONTRAST TECHNIQUE: Contiguous axial images were obtained from the base of the skull through the vertex without intravenous contrast. COMPARISON:  None. FINDINGS: Bony calvarium is intact. No gross soft tissue abnormality is noted. Mild atrophic changes are noted as well as scattered areas of chronic white matter ischemic change. A hemangioma is noted along the falx near the vertex scattered basal ganglia calcifications are seen. IMPRESSION: Atrophic and chronic white matter ischemic changes. Hyperdense lesion involving the falx near the vertex likely representing a meningioma. This measures approximately 18 mm in greatest dimension. No acute abnormality is noted. These results were called by telephone at the time of interpretation on 06/08/2016 at 7:01 pm to Dr. Gifford Shave, who verbally acknowledged these results. Electronically Signed   By: Inez Catalina M.D.   On: 06/08/2016 19:03   Ct Angio Neck W/cm &/or Wo/cm  06/09/2016  CLINICAL DATA:  82 year old female status post pacemaker placement yesterday with acute onset left side weakness. Initial encounter. EXAM: CT ANGIOGRAPHY HEAD AND NECK TECHNIQUE: Multidetector CT imaging of the head and neck was performed using the standard protocol during bolus administration of intravenous contrast. Multiplanar CT image reconstructions and MIPs were obtained to evaluate the vascular anatomy. Carotid stenosis measurements (when applicable) are  obtained utilizing NASCET criteria, using the distal internal carotid diameter as the denominator. CONTRAST:  50 mL Isovue 370 COMPARISON:  Head CT without contrast 06/08/2016. Chest CT 05/07/2016. FINDINGS: CT HEAD Brain: Stable cerebral volume. No acute intracranial hemorrhage identified. No acute or evolving cortically based  infarct identified. Patchy and confluent bilateral white matter hypodensity appears stable and is nonspecific. Deep gray matter nuclei appear stable and within normal limits. Posterior fossa gray-white matter differentiation appears stable. Calvarium and skull base: Stable and intact. Paranasal sinuses: Stable and well pneumatized visualized paranasal sinuses and mastoids. Orbits: No acute orbit or scalp soft tissue findings. CTA NECK Skeleton: Degenerative changes in the lower cervical spine including multilevel mild spondylolisthesis. Osteopenia. No acute osseous abnormality identified. Other neck: Intubated. Endotracheal tube tip terminates above the carina. Small volume retained secretions in the trachea. Mild fluid in the pharynx. Layering left pleural effusion, likely moderate in size as fluid tracks to the left apex. Centrilobular emphysema in the visible lung parenchyma. Several small areas of patchy ground-glass opacity in the left upper lobe. There is a partially visible more solid-appearing 8 mm lung nodule on series 501, image 1 which is new since May. No superior mediastinal lymphadenopathy. Left thyroid goiter with dystrophic calcification and partial extension into the superior mediastinum. Negative superior parapharyngeal and retropharyngeal spaces. Sublingual space, submandibular glands and parotid glands are within normal limits. Cachexia. No cervical lymphadenopathy identified. Aortic arch: 3 vessel arch configuration with extensive calcified arch atherosclerosis. Right carotid system: No brachiocephalic artery or right CCA origin stenosis despite calcified plaque. No right  CCA stenosis despite calcified plaque. Confluent calcified plaque at the right carotid bifurcation affecting the right ICA origin and to a lesser extent the bulb. Subsequent stenosis is less than 50 % with respect to the distal vessel. Negative cervical right ICA otherwise aside from tortuosity. Left carotid system: No left CCA stenosis despite soft and calcified plaque. Confluent calcified plaque at the posterior left ICA origin. Subsequent stenosis is less than 50 % with respect to the distal vessel. Negative cervical left ICA otherwise. Vertebral arteries: No proximal left subclavian artery stenosis despite soft and calcified plaque. Left vertebral artery origin without stenosis despite soft plaque. Calcified plaque in the left V1, V2, and V3 segments with no hemodynamically significant stenosis. No proximal right subclavian artery stenosis despite calcified plaque. That plaque does affect the right vertebral artery origin with mild stenosis. There is calcified plaque in the right V1 segment with additional mild stenosis. Mild right V3 segment calcified plaque. No right vertebral artery stenosis to the skullbase. CTA HEAD Posterior circulation: Extensive calcified plaque in the bilateral vertebral artery V4 segments. Mild to moderate bilateral distal vertebral artery stenosis but preserved patency of the vertebrobasilar junction. No basilar artery stenosis. SCA and PCA origins are normal. Posterior communicating arteries are diminutive or absent. The left PCA P2 segment is moderately irregular, but there is preserved distal flow (series 5015, image 20). The right PCA branches are mildly irregular. Anterior circulation: Both ICA siphons are patent with extensive bilateral calcified atherosclerosis. Up to mild left siphon stenosis occurs in the proximal supraclinoid segment. On the right there is mild siphon stenosis at the proximal cavernous segment, it and the proximal supraclinoid segment. Both carotid termini  remain patent. Normal MCA and ACA origins. Diminutive or absent anterior communicating artery. Bilateral ACA branches are within normal limits. Left MCA M1 segment, bifurcation, and left MCA branches are patent. There is mild irregularity at the left bifurcation and in the left MCA branches. Right MCA M1 segment, bifurcation, and right MCA branches remain patent. There is mild irregularity at the bifurcation and in the right MCA branches. Venous sinuses: Patent. Anatomic variants: None. Delayed phase: Round homogeneously enhancing 16 mm diameter lesion of the interhemispheric fissure (series 601,  image 25) corresponds to the hyperdense lesion described yesterday and appears stable. Minimal to mild associated mass effect with no definite cerebral edema. No other abnormal intracranial enhancement. IMPRESSION: 1. Negative for emergent large vessel occlusion. 2. Extensive atherosclerosis, but no anterior circulation hemodynamically significant stenosis. There are mild bilateral ICA siphon stenoses. 3. Up to moderate stenoses of the bilateral vertebral arteries and left PCA P2 segment in the posterior circulation. 4. No acute or evolving cortically based infarct identified. Advanced white matter signal changes compatible with chronic small vessel disease. 5. Intubated. Small volume retained secretions in the trachea. Partially visible patchy and nodular left upper lung opacity is new since 05/07/2016 compatible with infectious or inflammatory process there is an associated moderate size layering left pleural effusion. Underlying emphysema. 6. Parafalcine 16 mm meningioma at the level of the cingulate gyri with no significant mass effect and no definite cerebral edema. Electronically Signed   By: Genevie Ann M.D.   On: 06/09/2016 15:54   Dg Chest Portable 1 View  06/11/2016  CLINICAL DATA:  Acute respiratory failure, shortness of breath. EXAM: PORTABLE CHEST 1 VIEW COMPARISON:  Chest x-rays dated 06/10/2016 in 05/2012  2017. FINDINGS: Endotracheal tube is well positioned with tip just above the level of the carina. Enteric tube passes below the diaphragm. Left chest wall pacemaker/ ICD appears stable in position. Cardiomediastinal silhouette is stable in size and configuration. Lungs appear stable compared to yesterday's chest x-ray. There is improved aeration at the left lung base compared to the exam of 06/09/2016. Mild central pulmonary vascular congestion persists. Lungs are again hyperexpanded suggesting COPD. Probable associated chronic bronchitic changes centrally. No pleural effusion or pneumothorax seen. IMPRESSION: Lungs appear stable compared to yesterday's chest x-ray. Persistent mild central pulmonary vascular congestion. Hyperexpanded lungs suggesting COPD. Suspect associated chronic bronchitic changes centrally. Endotracheal tube well positioned with tip just above the level of the carina. Electronically Signed   By: Franki Cabot M.D.   On: 06/11/2016 07:40   Dg Chest Port 1 View  06/10/2016  CLINICAL DATA:  Hypoxia EXAM: PORTABLE CHEST 1 VIEW COMPARISON:  June 09, 2016 FINDINGS: Endotracheal tube tip is 3.2 cm above the carina. Nasogastric tube tip and side port are in the stomach. Pacemaker leads are attached to the right atrium and right ventricle. There is no demonstrable pneumothorax. There has been interval clearing of infiltrate from the left base with only mild left base atelectasis remaining. There is mild perihilar interstitial edema, slightly less compared to 1 day prior. No new opacity. Lungs are hyperexpanded. Heart size and pulmonary vascularity are within normal limits. No adenopathy evident. IMPRESSION: Evidence of clearing of infiltrate from left base as well as partial but incomplete clearing of perihilar interstitial edema. There is residual atelectasis in the left base and mild perihilar interstitial edema currently. No new opacity. No change in cardiac silhouette. Tube positions as described  without pneumothorax. Electronically Signed   By: Lowella Grip III M.D.   On: 06/10/2016 07:04   Dg Chest Port 1 View  06/09/2016  CLINICAL DATA:  Respiratory failure. EXAM: PORTABLE CHEST 1 VIEW COMPARISON:  06/08/2016. FINDINGS: Endotracheal tube is 2.2 cm above the carina. Cardiac pacer with lead tips in right atrium right ventricle. Right perihilar and left lower lobe infiltrates are present. Small left-sided pleural effusion. No pneumothorax. Surgical clips upper abdomen. IMPRESSION: 1.  Endotracheal tube 2.2 cm above the carina. 2. Right perihilar and diffuse left lung infiltrates. Small left pleural effusion. 3. Cardiac pacer stable position.  Heart size stable. Electronically Signed   By: Marcello Moores  Register   On: 06/09/2016 07:01   Dg Chest Port 1 View  06/08/2016  CLINICAL DATA:  Endotracheal tube placement. EXAM: PORTABLE CHEST 1 VIEW COMPARISON:  06/08/2016 FINDINGS: New endotracheal tube tip projects 5.7 cm above the carina. Cardiac silhouette is borderline enlarged. No gross mediastinal or hilar masses. Lungs are hyperexpanded with irregularly thickened interstitial markings. Hazy airspace opacification is noted on the left most evident centrally accentuated by rotation to the right. This is similar to the earlier exam. Left-sided pacemaker is stable. IMPRESSION: 1. New endotracheal tip projects 5.7 cm above the carina. 2. No significant change in the appearance of the lungs. Findings may reflect a combination of interstitial and mild hazy airspace edema from congestive heart failure. There is underlying COPD. Electronically Signed   By: Lajean Manes M.D.   On: 06/08/2016 18:39   Dg Chest Port 1 View  06/08/2016  CLINICAL DATA:  Unresponsive with left-sided weakness following pacemaker placement. EXAM: PORTABLE CHEST 1 VIEW COMPARISON:  Radiographs 04/04/2010.  CT 05/07/2016. FINDINGS: 1727 hours. Left subclavian pacemaker leads project over the right atrium and right ventricle. The heart  size and mediastinal contours are stable allowing for patient rotation to the right. There is diffuse aortic atherosclerosis. There is vascular congestion with new asymmetric perihilar and lower lobe airspace disease on the left. No pneumothorax or significant pleural effusion identified. The bones appear unchanged. IMPRESSION: 1. The left subclavian pacemaker leads appear satisfactorily positioned in the right atrium and right ventricle. No pneumothorax. 2. New vascular congestion with asymmetric airspace disease on the left consistent with asymmetric edema or aspiration. Electronically Signed   By: Richardean Sale M.D.   On: 06/08/2016 17:44   Dg Abd Portable 1v  06/09/2016  CLINICAL DATA:  Nasogastric tube placement. EXAM: PORTABLE ABDOMEN - 1 VIEW COMPARISON:  CT 05/07/16 FINDINGS: Nasogastric tube tip is at the level of the stomach. Epigastric sutures and clips related to previous stomach surgery. Contrast in the urinary collecting system related to recent CTA. Lung bases are grossly clear. Nonobstructive bowel gas pattern. Lumbar scoliosis. IMPRESSION: Nasogastric tube with tip at the stomach. Electronically Signed   By: Monte Fantasia M.D.   On: 06/09/2016 18:54    Assessment/Plan  Physical deconditioning Will have her work with physical therapy and occupational therapy team to help with gait training and muscle strengthening exercises.fall precautions. Skin care. Encourage to be out of bed.   Protein calorie malnutrition Monitor weight and encourage po intake. Add procel 2 scoop with meals. Get RD consult  Tachy-brady syndrome S/p pacemaker placement. Monitor clinically. Continue amiodarone  Acute CVA With left sided weakness. Will have patient work with PT/OT as tolerated to regain strength and restore function.  Fall precautions are in place. Continue eliquis for secondary stroke prevention with statin.  afib Rate controlled. Continue amiodarone 400 mg bid x 2 days, then 400 mg daily x  2 weeks and then 200 mg daily. Continue eliquis for anticoagulation. Has cardiology follow up  HTN Stable bp reading, monitor BP bid x 1 week, continue norvasc  HLD Continue lipitor  Lipid Panel     Component Value Date/Time   CHOL 162 06/08/2016 2052   CHOL 175 11/08/2014 1341   TRIG 92 06/08/2016 2052   TRIG 109 11/08/2014 1341   HDL 61 06/08/2016 2052   HDL 68* 11/08/2014 1341   CHOLHDL 2.7 06/08/2016 2052   VLDL 18 06/08/2016 2052   VLDL 22 11/08/2014  [place] 06/08/2016 2052   [place] 85 11/08/2014 1341    gerd Stable symptom, continue prilosec, no changes made  Chronic respiratory failure Continue her bronchodilators and monitor her breathing status  Copd Continue pulmicort with brovana and monitor. Continue prn duoneb. Continue and complete her tapering course of prednisone and monitor.   Goals of care: short term rehabilitation   Labs/tests ordered: cbc, cmp 06/22/16  Family/ staff Communication: reviewed care plan with patient and nursing supervisor    Blanchie Serve, MD Internal Medicine [place], [place] [address] Cell Phone (Monday-Friday 8 am - 5 pm): [phone] On Call: [phone] and follow prompts after 5 pm and on weekends Office Phone: [phone] Office Fax: [phone]

## 2016-06-18 ENCOUNTER — Ambulatory Visit: Payer: Medicare HMO

## 2016-06-22 LAB — CBC AND DIFFERENTIAL
HEMATOCRIT: 42 % (ref 36–46)
Hemoglobin: 13.3 g/dL (ref 12.0–16.0)
Platelets: 289 10*3/uL (ref 150–399)
WBC: 17.8 10^3/mL

## 2016-06-23 ENCOUNTER — Encounter: Payer: Self-pay | Admitting: Family

## 2016-06-23 ENCOUNTER — Non-Acute Institutional Stay (SKILLED_NURSING_FACILITY): Payer: Medicare HMO | Admitting: Family

## 2016-06-23 ENCOUNTER — Inpatient Hospital Stay: Payer: Medicare HMO | Admitting: Internal Medicine

## 2016-06-23 DIAGNOSIS — D751 Secondary polycythemia: Secondary | ICD-10-CM | POA: Diagnosis not present

## 2016-06-23 DIAGNOSIS — S51811D Laceration without foreign body of right forearm, subsequent encounter: Secondary | ICD-10-CM | POA: Diagnosis not present

## 2016-06-23 DIAGNOSIS — S51012D Laceration without foreign body of left elbow, subsequent encounter: Secondary | ICD-10-CM | POA: Diagnosis not present

## 2016-06-23 DIAGNOSIS — L8992 Pressure ulcer of unspecified site, stage 2: Secondary | ICD-10-CM

## 2016-06-23 DIAGNOSIS — F329 Major depressive disorder, single episode, unspecified: Secondary | ICD-10-CM | POA: Diagnosis not present

## 2016-06-23 DIAGNOSIS — F32A Depression, unspecified: Secondary | ICD-10-CM

## 2016-06-23 NOTE — Progress Notes (Signed)
Patient ID: Lydia [name], female   DOB: [birth year], 82 y.o.   MRN: [id number]  Location:   [facility] Room Number: 1206-P Place of Service:  SNF (31) Provider: Shealyn Sean FNP-C   [facility], Wilhemena Durie, MD  Patient Care Team: Marinda Elk, MD as PCP - General (Physician Assistant)  Extended Emergency Contact Information Primary Emergency Contact: Dulce Sellar Address: [address]          [place], [place] [address] Johnnette Litter of [place] Phone: [phone] Relation: Spouse Secondary Emergency Contact: Keyser of [place] Phone: [phone] Relation: Son  Code Status: DNR  Goals of care: Advanced Directive information Advanced Directives 06/17/2016  Does patient have an advance directive? Yes  Type of Advance Directive Out of facility DNR (pink MOST or yellow form)  Does patient want to make changes to advanced directive? No - Patient declined  Copy of advanced directive(s) in chart? Yes  Would patient like information on creating an advanced directive? -     Chief Complaint  Patient presents with  . Acute Visit    Abnormal labs    HPI:  Pt is a 82 y.o. female seen today at [facility]   for an acute visit for evaluation of depression. She is seen today in her room per facility Nurse request who states patient has been depressed. She is status post PPM placement on 06/08/16. Post surgery she had acute respiratory failure and required mechanical ventilation. She was treated with antibiotics for aspiration pneumonia and also had acute right mid brain infarct and was seen by neurology. She was seen by cardiology service for her afib. She does admit feeling depressed but states does not want any more pills added " I'm tired of doctors and Pills". She denies any acute issues.    Past Medical History  Diagnosis Date  . Essential hypertension   . Osteoporosis   . Lower back pain     . History of stomach ulcers     a. s/p surgery ~ 40 yrs ago.  . Polycythemia     a. phlebotomy.  Marland Kitchen COPD (chronic obstructive pulmonary disease) ([place])   . IDA (iron deficiency anemia)     a. 04/2016 CT Abd: no malignancy.  . Weight loss, unintentional   . Thyroid nodule     a. 04/2016 large left thyroid nodule on CT chest  - rec dedicated u/s.  . Pulmonary nodule, left     a. 04/2016 CT chest: 16mm LUL nodule - f/u CT in 12 months (h/o smoking).  Marland Kitchen PAD (peripheral artery disease) ([place])     a. 04/2016 CT Abd: occluded prox LSFA.  . Superior mesenteric artery stenosis ([place])     a. 04/2016 CT Abd: atheromatous plaque and mural thrombus in SMA.  . Tobacco abuse   . New onset atrial flutter ([place]) 05/2016  . Tachy-brady syndrome ([place]) 05/2016    a. to [facility] for PPM placement   Past Surgical History  Procedure Laterality Date  . Blepharoplasty    . Stomach surgery  1995    for ulcers  . Colonoscopy    . Cataract extraction w/phaco Right 01/27/2016    Procedure: CATARACT EXTRACTION PHACO AND INTRAOCULAR LENS PLACEMENT (IOC);  Surgeon: Ronnell Freshwater, MD;  Location: [facility];  Service: Ophthalmology;  Laterality: Right;  . Colonoscopy  2005, 2010  . Ep implantable device N/A 06/08/2016    Procedure: Pacemaker  Implant;  Surgeon: Evans Lance, MD;  Location: [facility];  Service: Cardiovascular;  Laterality: N/A;    Allergies  Allergen Reactions  . Penicillins Hives    Has patient had a PCN reaction causing immediate rash, facial/tongue/throat swelling, SOB or lightheadedness with hypotension: Yes Has patient had a PCN reaction causing severe rash involving mucus membranes or skin necrosis: No Has patient had a PCN reaction that required hospitalization No Has patient had a PCN reaction occurring within the last 10 years. No If all of the above answers are "NO", then may proceed with Cephalosporin use.      Medication List       This list is accurate as of:  06/23/16  6:38 PM.  Always use your most recent med list.               amiodarone 400 MG tablet  Commonly known as:  PACERONE  Take 400 mg by mouth daily. Stop date 07/01/16     amLODipine 5 MG tablet  Commonly known as:  NORVASC  Take 5 mg by mouth daily.     apixaban 2.5 MG Tabs tablet  Commonly known as:  ELIQUIS  Take 1 tablet (2.5 mg total) by mouth 2 (two) times daily.     arformoterol 15 MCG/2ML Nebu  Commonly known as:  BROVANA  Take 2 mLs (15 mcg total) by nebulization 2 (two) times daily.     atorvastatin 10 MG tablet  Commonly known as:  LIPITOR  Take 1 tablet (10 mg total) by mouth daily at 6 PM.     budesonide 0.5 MG/2ML nebulizer solution  Commonly known as:  PULMICORT  Take 2 mLs (0.5 mg total) by nebulization 2 (two) times daily.     ipratropium-albuterol 0.5-2.5 (3) MG/3ML Soln  Commonly known as:  DUONEB  Take 3 mLs by nebulization every 6 (six) hours as needed.     omeprazole 20 MG capsule  Commonly known as:  PRILOSEC  Take 20 mg by mouth daily.        Review of Systems  Constitutional: Positive for fatigue. Negative for fever, chills, activity change and appetite change.  HENT: Negative for congestion, rhinorrhea, sinus pressure, sneezing and sore throat.   Eyes: Negative.   Respiratory: Negative for cough, chest tightness, shortness of breath and wheezing.   Cardiovascular: Negative for chest pain and palpitations.  Gastrointestinal: Negative for nausea, vomiting, abdominal pain, diarrhea, constipation and abdominal distention.  Endocrine: Negative.   Genitourinary: Negative for dysuria, urgency, frequency and flank pain.  Musculoskeletal: Positive for gait problem.  Skin:       Skin tears  Hematological: Bruises/bleeds easily.  Psychiatric/Behavioral: Negative for suicidal ideas, hallucinations, confusion, sleep disturbance, self-injury and agitation. The patient is not nervous/anxious.     Immunization History  Administered Date(s)  Administered  . Pneumococcal Polysaccharide-23 06/07/2016   Pertinent  Health Maintenance Due  Topic Date Due  . DEXA SCAN  10/02/1994  . INFLUENZA VACCINE  07/28/2016  . PNA vac Low Risk Adult (2 of 2 - PCV13) 06/07/2017   No flowsheet data found. Functional Status Survey:    Filed Vitals:   06/23/16 1228  BP: 158/77  Pulse: 61  Temp: 99 F (37.2 C)  TempSrc: Oral  Resp: 18  Height: 5\' 8"  (1.727 m)  Weight: 98 lb (44.453 kg)  SpO2: 92%   Body mass index is 14.9 kg/(m^2). Physical Exam  Constitutional: She is oriented to person, place, and time.  Thin  Frail Elderly in no acute distress  HENT:  Head: Normocephalic.  Mouth/Throat: Oropharynx is clear and moist. No oropharyngeal exudate.  Eyes: Conjunctivae and EOM are normal. Pupils are equal, round, and reactive to light. Right eye exhibits no discharge. Left eye exhibits no discharge. No scleral icterus.  Neck: Normal range of motion. No thyromegaly present.  Cardiovascular: Normal rate, regular rhythm, normal heart sounds and intact distal pulses.  Exam reveals no gallop and no friction rub.   No murmur heard. Pulmonary/Chest: [place] normal and breath sounds normal. No respiratory distress. She has no wheezes. She has no rales.  Abdominal: Soft. Bowel sounds are normal. She exhibits no distension. There is no tenderness. There is no rebound and no guarding.  Musculoskeletal: She exhibits no tenderness.  Move X 4 extremities. Generalized weakness   Lymphadenopathy:    She has no cervical adenopathy.  Neurological: She is oriented to person, place, and time.  Skin: Skin is warm and dry. No rash noted. No erythema. No pallor.  Multiple skin tears on UEs and LE's managed by wound Nurse. Progressive healing. No signs of infection. Scaral stage 2 progressive healing noted. Occlusive dressing for protection applied by wound nurse during visit.   Psychiatric:  Depressed mood.     Labs reviewed:  Recent Labs   06/10/16 1625 06/10/16 2109 06/11/16 0457 06/11/16 1026 06/12/16 0327 06/15/16 0414  NA  --  134* 136 134* 136 134*  K  --  2.9* 4.7 3.9 4.2 4.3  CL  --  97* 98* 100* 102 98*  CO2  --  28 28 23 27 30   GLUCOSE  --  137* 168* 173* 124* 87  BUN  --  29* 28* 30* 30* 32*  CREATININE  --  1.01* 0.83 0.84 0.73 0.93  CALCIUM  --  9.1 9.2 9.0 9.1 8.4*  MG 1.7 1.8 1.9  --  1.9  --   PHOS 4.9*  --  3.9  --  3.7  --     Recent Labs  04/16/16 1222 06/08/16 2052 06/15/16 0414  AST 28 54* 16  ALT 24 60* 21  ALKPHOS 54 112 63  BILITOT 0.4 1.1 0.3  PROT 6.5 5.8* 4.7*  ALBUMIN 3.8 2.9* 2.4*    Recent Labs  05/15/16 1345  06/08/16 2052  06/10/16 0205 06/11/16 0457 06/12/16 0327 06/15/16 0414 06/22/16  WBC 5.5  < > 22.8*  < > 11.0* 12.4* 12.5* 8.4 17.8  NEUTROABS 4.6  --  21.4*  --  10.5*  --   --   --   --   HGB 13.8  < > 13.7  < > 11.8* 12.0 12.4 12.0 13.3  HCT 43.0  < > 43.1  < > 36.3 38.4 39.2 37.9 42  MCV 78.7*  < > 85.9  < > 83.4 83.1 85.6 84.2  --   PLT 287  < > 237  < > 190 236 218 226 289  < > = values in this interval not displayed. Lab Results  Component Value Date   TSH 1.673 06/05/2016   Lab Results  Component Value Date   HGBA1C 5.4 06/08/2016   Lab Results  Component Value Date   CHOL 162 06/08/2016   HDL 61 06/08/2016   LDLCALC 83 06/08/2016   TRIG 92 06/08/2016   CHOLHDL 2.7 06/08/2016    Significant Diagnostic Results in last 30 days:  Ct Angio Head W/cm &/or Wo Cm  06/09/2016  CLINICAL DATA:  82 year old female status post  pacemaker placement yesterday with acute onset left side weakness. Initial encounter. EXAM: CT ANGIOGRAPHY HEAD AND NECK TECHNIQUE: Multidetector CT imaging of the head and neck was performed using the standard protocol during bolus administration of intravenous contrast. Multiplanar CT image reconstructions and MIPs were obtained to evaluate the vascular anatomy. Carotid stenosis measurements (when applicable) are obtained utilizing  NASCET criteria, using the distal internal carotid diameter as the denominator. CONTRAST:  50 mL Isovue 370 COMPARISON:  Head CT without contrast 06/08/2016. Chest CT 05/07/2016. FINDINGS: CT HEAD Brain: Stable cerebral volume. No acute intracranial hemorrhage identified. No acute or evolving cortically based infarct identified. Patchy and confluent bilateral white matter hypodensity appears stable and is nonspecific. Deep gray matter nuclei appear stable and within normal limits. Posterior fossa gray-white matter differentiation appears stable. Calvarium and skull base: Stable and intact. Paranasal sinuses: Stable and well pneumatized visualized paranasal sinuses and mastoids. Orbits: No acute orbit or scalp soft tissue findings. CTA NECK Skeleton: Degenerative changes in the lower cervical spine including multilevel mild spondylolisthesis. Osteopenia. No acute osseous abnormality identified. Other neck: Intubated. Endotracheal tube tip terminates above the carina. Small volume retained secretions in the trachea. Mild fluid in the pharynx. Layering left pleural effusion, likely moderate in size as fluid tracks to the left apex. Centrilobular emphysema in the visible lung parenchyma. Several small areas of patchy ground-glass opacity in the left upper lobe. There is a partially visible more solid-appearing 8 mm lung nodule on series 501, image 1 which is new since May. No superior mediastinal lymphadenopathy. Left thyroid goiter with dystrophic calcification and partial extension into the superior mediastinum. Negative superior parapharyngeal and retropharyngeal spaces. Sublingual space, submandibular glands and parotid glands are within normal limits. Cachexia. No cervical lymphadenopathy identified. Aortic arch: 3 vessel arch configuration with extensive calcified arch atherosclerosis. Right carotid system: No brachiocephalic artery or right CCA origin stenosis despite calcified plaque. No right CCA stenosis  despite calcified plaque. Confluent calcified plaque at the right carotid bifurcation affecting the right ICA origin and to a lesser extent the bulb. Subsequent stenosis is less than 50 % with respect to the distal vessel. Negative cervical right ICA otherwise aside from tortuosity. Left carotid system: No left CCA stenosis despite soft and calcified plaque. Confluent calcified plaque at the posterior left ICA origin. Subsequent stenosis is less than 50 % with respect to the distal vessel. Negative cervical left ICA otherwise. Vertebral arteries: No proximal left subclavian artery stenosis despite soft and calcified plaque. Left vertebral artery origin without stenosis despite soft plaque. Calcified plaque in the left V1, V2, and V3 segments with no hemodynamically significant stenosis. No proximal right subclavian artery stenosis despite calcified plaque. That plaque does affect the right vertebral artery origin with mild stenosis. There is calcified plaque in the right V1 segment with additional mild stenosis. Mild right V3 segment calcified plaque. No right vertebral artery stenosis to the skullbase. CTA HEAD Posterior circulation: Extensive calcified plaque in the bilateral vertebral artery V4 segments. Mild to moderate bilateral distal vertebral artery stenosis but preserved patency of the vertebrobasilar junction. No basilar artery stenosis. SCA and PCA origins are normal. Posterior communicating arteries are diminutive or absent. The left PCA P2 segment is moderately irregular, but there is preserved distal flow (series 5015, image 20). The right PCA branches are mildly irregular. Anterior circulation: Both ICA siphons are patent with extensive bilateral calcified atherosclerosis. Up to mild left siphon stenosis occurs in the proximal supraclinoid segment. On the right there is mild siphon stenosis  at the proximal cavernous segment, it and the proximal supraclinoid segment. Both carotid termini remain patent.  Normal MCA and ACA origins. Diminutive or absent anterior communicating artery. Bilateral ACA branches are within normal limits. Left MCA M1 segment, bifurcation, and left MCA branches are patent. There is mild irregularity at the left bifurcation and in the left MCA branches. Right MCA M1 segment, bifurcation, and right MCA branches remain patent. There is mild irregularity at the bifurcation and in the right MCA branches. Venous sinuses: Patent. Anatomic variants: None. Delayed phase: Round homogeneously enhancing 16 mm diameter lesion of the interhemispheric fissure (series 601, image 25) corresponds to the hyperdense lesion described yesterday and appears stable. Minimal to mild associated mass effect with no definite cerebral edema. No other abnormal intracranial enhancement. IMPRESSION: 1. Negative for emergent large vessel occlusion. 2. Extensive atherosclerosis, but no anterior circulation hemodynamically significant stenosis. There are mild bilateral ICA siphon stenoses. 3. Up to moderate stenoses of the bilateral vertebral arteries and left PCA P2 segment in the posterior circulation. 4. No acute or evolving cortically based infarct identified. Advanced white matter signal changes compatible with chronic small vessel disease. 5. Intubated. Small volume retained secretions in the trachea. Partially visible patchy and nodular left upper lung opacity is new since 05/07/2016 compatible with infectious or inflammatory process there is an associated moderate size layering left pleural effusion. Underlying emphysema. 6. Parafalcine 16 mm meningioma at the level of the cingulate gyri with no significant mass effect and no definite cerebral edema. Electronically Signed   By: Genevie Ann M.D.   On: 06/09/2016 15:54   Dg Chest 2 View  06/15/2016  CLINICAL DATA:  82 year old female with a history of hypoxia EXAM: CHEST  2 VIEW COMPARISON:  06/11/2016, 06/10/2016, 06/09/2016 FINDINGS: Cardiomediastinal silhouette  unchanged in size and contour. Calcifications of aortic arch and aorta. Stigmata of emphysema, with increased retrosternal airspace, flattened hemidiaphragms, increased AP diameter, and hyperinflation on the AP view. Cardiac pacing device left chest wall with 2 leads in place. Interlobular septal thickening throughout. Portions of the lower thorax have been excluded on the lateral view, though there is blunting of the costophrenic sulcus. No pneumothorax. Nodular/airspace opacity in the bilateral hilar regions. IMPRESSION: Advanced emphysema with nodular hilar opacities and interstitial opacities suggesting a combination of edema and/ or superimposed infection. Aeration is similar to the comparison. Likely bilateral small pleural effusions. Atherosclerosis and cardiac pacing device unchanged. Signed, Dulcy Fanny. Earleen Newport, DO [facility] Electronically Signed   By: Corrie Mckusick D.O.   On: 06/15/2016 07:18   Ct Head Wo Contrast  06/08/2016  CLINICAL DATA:  Left-sided weakness EXAM: CT HEAD WITHOUT CONTRAST TECHNIQUE: Contiguous axial images were obtained from the base of the skull through the vertex without intravenous contrast. COMPARISON:  None. FINDINGS: Bony calvarium is intact. No gross soft tissue abnormality is noted. Mild atrophic changes are noted as well as scattered areas of chronic white matter ischemic change. A hemangioma is noted along the falx near the vertex scattered basal ganglia calcifications are seen. IMPRESSION: Atrophic and chronic white matter ischemic changes. Hyperdense lesion involving the falx near the vertex likely representing a meningioma. This measures approximately 18 mm in greatest dimension. No acute abnormality is noted. These results were called by telephone at the time of interpretation on 06/08/2016 at 7:01 pm to Dr. Gifford Shave, who verbally acknowledged these results. Electronically Signed   By: Inez Catalina M.D.   On:  06/08/2016 19:03   Ct  Angio Neck W/cm &/or Wo/cm  06/09/2016  CLINICAL DATA:  82 year old female status post pacemaker placement yesterday with acute onset left side weakness. Initial encounter. EXAM: CT ANGIOGRAPHY HEAD AND NECK TECHNIQUE: Multidetector CT imaging of the head and neck was performed using the standard protocol during bolus administration of intravenous contrast. Multiplanar CT image reconstructions and MIPs were obtained to evaluate the vascular anatomy. Carotid stenosis measurements (when applicable) are obtained utilizing NASCET criteria, using the distal internal carotid diameter as the denominator. CONTRAST:  50 mL Isovue 370 COMPARISON:  Head CT without contrast 06/08/2016. Chest CT 05/07/2016. FINDINGS: CT HEAD Brain: Stable cerebral volume. No acute intracranial hemorrhage identified. No acute or evolving cortically based infarct identified. Patchy and confluent bilateral white matter hypodensity appears stable and is nonspecific. Deep gray matter nuclei appear stable and within normal limits. Posterior fossa gray-white matter differentiation appears stable. Calvarium and skull base: Stable and intact. Paranasal sinuses: Stable and well pneumatized visualized paranasal sinuses and mastoids. Orbits: No acute orbit or scalp soft tissue findings. CTA NECK Skeleton: Degenerative changes in the lower cervical spine including multilevel mild spondylolisthesis. Osteopenia. No acute osseous abnormality identified. Other neck: Intubated. Endotracheal tube tip terminates above the carina. Small volume retained secretions in the trachea. Mild fluid in the pharynx. Layering left pleural effusion, likely moderate in size as fluid tracks to the left apex. Centrilobular emphysema in the visible lung parenchyma. Several small areas of patchy ground-glass opacity in the left upper lobe. There is a partially visible more solid-appearing 8 mm lung nodule on series 501, image 1 which is new since May. No  superior mediastinal lymphadenopathy. Left thyroid goiter with dystrophic calcification and partial extension into the superior mediastinum. Negative superior parapharyngeal and retropharyngeal spaces. Sublingual space, submandibular glands and parotid glands are within normal limits. Cachexia. No cervical lymphadenopathy identified. Aortic arch: 3 vessel arch configuration with extensive calcified arch atherosclerosis. Right carotid system: No brachiocephalic artery or right CCA origin stenosis despite calcified plaque. No right CCA stenosis despite calcified plaque. Confluent calcified plaque at the right carotid bifurcation affecting the right ICA origin and to a lesser extent the bulb. Subsequent stenosis is less than 50 % with respect to the distal vessel. Negative cervical right ICA otherwise aside from tortuosity. Left carotid system: No left CCA stenosis despite soft and calcified plaque. Confluent calcified plaque at the posterior left ICA origin. Subsequent stenosis is less than 50 % with respect to the distal vessel. Negative cervical left ICA otherwise. Vertebral arteries: No proximal left subclavian artery stenosis despite soft and calcified plaque. Left vertebral artery origin without stenosis despite soft plaque. Calcified plaque in the left V1, V2, and V3 segments with no hemodynamically significant stenosis. No proximal right subclavian artery stenosis despite calcified plaque. That plaque does affect the right vertebral artery origin with mild stenosis. There is calcified plaque in the right V1 segment with additional mild stenosis. Mild right V3 segment calcified plaque. No right vertebral artery stenosis to the skullbase. CTA HEAD Posterior circulation: Extensive calcified plaque in the bilateral vertebral artery V4 segments. Mild to moderate bilateral distal vertebral artery stenosis but preserved patency of the vertebrobasilar junction. No basilar artery stenosis. SCA and PCA origins are  normal. Posterior communicating arteries are diminutive or absent. The left PCA P2 segment is moderately irregular, but there is preserved distal flow (series 5015, image 20). The right PCA branches are mildly irregular. Anterior circulation: Both ICA siphons are patent with extensive bilateral calcified atherosclerosis. Up to mild left siphon  stenosis occurs in the proximal supraclinoid segment. On the right there is mild siphon stenosis at the proximal cavernous segment, it and the proximal supraclinoid segment. Both carotid termini remain patent. Normal MCA and ACA origins. Diminutive or absent anterior communicating artery. Bilateral ACA branches are within normal limits. Left MCA M1 segment, bifurcation, and left MCA branches are patent. There is mild irregularity at the left bifurcation and in the left MCA branches. Right MCA M1 segment, bifurcation, and right MCA branches remain patent. There is mild irregularity at the bifurcation and in the right MCA branches. Venous sinuses: Patent. Anatomic variants: None. Delayed phase: Round homogeneously enhancing 16 mm diameter lesion of the interhemispheric fissure (series 601, image 25) corresponds to the hyperdense lesion described yesterday and appears stable. Minimal to mild associated mass effect with no definite cerebral edema. No other abnormal intracranial enhancement. IMPRESSION: 1. Negative for emergent large vessel occlusion. 2. Extensive atherosclerosis, but no anterior circulation hemodynamically significant stenosis. There are mild bilateral ICA siphon stenoses. 3. Up to moderate stenoses of the bilateral vertebral arteries and left PCA P2 segment in the posterior circulation. 4. No acute or evolving cortically based infarct identified. Advanced white matter signal changes compatible with chronic small vessel disease. 5. Intubated. Small volume retained secretions in the trachea. Partially visible patchy and nodular left upper lung opacity is new since  05/07/2016 compatible with infectious or inflammatory process there is an associated moderate size layering left pleural effusion. Underlying emphysema. 6. Parafalcine 16 mm meningioma at the level of the cingulate gyri with no significant mass effect and no definite cerebral edema. Electronically Signed   By: Genevie Ann M.D.   On: 06/09/2016 15:54   Dg Chest Portable 1 View  06/11/2016  CLINICAL DATA:  Acute respiratory failure, shortness of breath. EXAM: PORTABLE CHEST 1 VIEW COMPARISON:  Chest x-rays dated 06/10/2016 in 05/2012 2017. FINDINGS: Endotracheal tube is well positioned with tip just above the level of the carina. Enteric tube passes below the diaphragm. Left chest wall pacemaker/ ICD appears stable in position. Cardiomediastinal silhouette is stable in size and configuration. Lungs appear stable compared to yesterday's chest x-ray. There is improved aeration at the left lung base compared to the exam of 06/09/2016. Mild central pulmonary vascular congestion persists. Lungs are again hyperexpanded suggesting COPD. Probable associated chronic bronchitic changes centrally. No pleural effusion or pneumothorax seen. IMPRESSION: Lungs appear stable compared to yesterday's chest x-ray. Persistent mild central pulmonary vascular congestion. Hyperexpanded lungs suggesting COPD. Suspect associated chronic bronchitic changes centrally. Endotracheal tube well positioned with tip just above the level of the carina. Electronically Signed   By: Franki Cabot M.D.   On: 06/11/2016 07:40   Dg Chest Port 1 View  06/10/2016  CLINICAL DATA:  Hypoxia EXAM: PORTABLE CHEST 1 VIEW COMPARISON:  June 09, 2016 FINDINGS: Endotracheal tube tip is 3.2 cm above the carina. Nasogastric tube tip and side port are in the stomach. Pacemaker leads are attached to the right atrium and right ventricle. There is no demonstrable pneumothorax. There has been interval clearing of infiltrate from the left base with only mild left base  atelectasis remaining. There is mild perihilar interstitial edema, slightly less compared to 1 day prior. No new opacity. Lungs are hyperexpanded. Heart size and pulmonary vascularity are within normal limits. No adenopathy evident. IMPRESSION: Evidence of clearing of infiltrate from left base as well as partial but incomplete clearing of perihilar interstitial edema. There is residual atelectasis in the left base and mild perihilar interstitial  edema currently. No new opacity. No change in cardiac silhouette. Tube positions as described without pneumothorax. Electronically Signed   By: Lowella Grip III M.D.   On: 06/10/2016 07:04   Dg Chest Port 1 View  06/09/2016  CLINICAL DATA:  Respiratory failure. EXAM: PORTABLE CHEST 1 VIEW COMPARISON:  06/08/2016. FINDINGS: Endotracheal tube is 2.2 cm above the carina. Cardiac pacer with lead tips in right atrium right ventricle. Right perihilar and left lower lobe infiltrates are present. Small left-sided pleural effusion. No pneumothorax. Surgical clips upper abdomen. IMPRESSION: 1.  Endotracheal tube 2.2 cm above the carina. 2. Right perihilar and diffuse left lung infiltrates. Small left pleural effusion. 3. Cardiac pacer stable position.  Heart size stable. Electronically Signed   By: Marcello Moores  Register   On: 06/09/2016 07:01   Dg Chest Port 1 View  06/08/2016  CLINICAL DATA:  Endotracheal tube placement. EXAM: PORTABLE CHEST 1 VIEW COMPARISON:  06/08/2016 FINDINGS: New endotracheal tube tip projects 5.7 cm above the carina. Cardiac silhouette is borderline enlarged. No gross mediastinal or hilar masses. Lungs are hyperexpanded with irregularly thickened interstitial markings. Hazy airspace opacification is noted on the left most evident centrally accentuated by rotation to the right. This is similar to the earlier exam. Left-sided pacemaker is stable. IMPRESSION: 1. New endotracheal tip projects 5.7 cm above the carina. 2. No significant change in the  appearance of the lungs. Findings may reflect a combination of interstitial and mild hazy airspace edema from congestive heart failure. There is underlying COPD. Electronically Signed   By: Lajean Manes M.D.   On: 06/08/2016 18:39   Dg Chest Port 1 View  06/08/2016  CLINICAL DATA:  Unresponsive with left-sided weakness following pacemaker placement. EXAM: PORTABLE CHEST 1 VIEW COMPARISON:  Radiographs 04/04/2010.  CT 05/07/2016. FINDINGS: 1727 hours. Left subclavian pacemaker leads project over the right atrium and right ventricle. The heart size and mediastinal contours are stable allowing for patient rotation to the right. There is diffuse aortic atherosclerosis. There is vascular congestion with new asymmetric perihilar and lower lobe airspace disease on the left. No pneumothorax or significant pleural effusion identified. The bones appear unchanged. IMPRESSION: 1. The left subclavian pacemaker leads appear satisfactorily positioned in the right atrium and right ventricle. No pneumothorax. 2. New vascular congestion with asymmetric airspace disease on the left consistent with asymmetric edema or aspiration. Electronically Signed   By: Richardean Sale M.D.   On: 06/08/2016 17:44   Dg Abd Portable 1v  06/09/2016  CLINICAL DATA:  Nasogastric tube placement. EXAM: PORTABLE ABDOMEN - 1 VIEW COMPARISON:  CT 05/07/16 FINDINGS: Nasogastric tube tip is at the level of the stomach. Epigastric sutures and clips related to previous stomach surgery. Contrast in the urinary collecting system related to recent CTA. Lung bases are grossly clear. Nonobstructive bowel gas pattern. Lumbar scoliosis. IMPRESSION: Nasogastric tube with tip at the stomach. Electronically Signed   By: Monte Fantasia M.D.   On: 06/09/2016 18:54    Assessment/Plan 1. Depression Admit to feeling depressed but does not want anymore  medication to be added. No suicide ideation. Consult IP for evaluation.   2. Acquired polycythemia Chronic. Has  multiple generalized petechiae. Continue to follow up with oncologist. CBC result pending.   3. Skin Tears  Multiple skin tears on UE's and LE's. Progressive healing. Continue with wound care.   4. Stage 2 Sacral Pressure Ulcer  Continue with wound care. Awaiting RD consult. Awaiting Gel mattress. Encouraged to change position frequently in bed.  Family/ staff Communication:Reviewed plan of care with patient and facility wound Nurse and  Nurse supervisor Labs/tests ordered: None

## 2016-06-30 ENCOUNTER — Other Ambulatory Visit: Payer: Self-pay | Admitting: Nurse Practitioner

## 2016-07-07 ENCOUNTER — Non-Acute Institutional Stay (SKILLED_NURSING_FACILITY): Payer: Medicare HMO | Admitting: Family

## 2016-07-07 ENCOUNTER — Encounter: Payer: Self-pay | Admitting: Family

## 2016-07-07 DIAGNOSIS — I6340 Cerebral infarction due to embolism of unspecified cerebral artery: Secondary | ICD-10-CM

## 2016-07-07 DIAGNOSIS — I480 Paroxysmal atrial fibrillation: Secondary | ICD-10-CM | POA: Diagnosis not present

## 2016-07-07 DIAGNOSIS — E785 Hyperlipidemia, unspecified: Secondary | ICD-10-CM

## 2016-07-07 DIAGNOSIS — S51812D Laceration without foreign body of left forearm, subsequent encounter: Secondary | ICD-10-CM | POA: Diagnosis not present

## 2016-07-07 DIAGNOSIS — J449 Chronic obstructive pulmonary disease, unspecified: Secondary | ICD-10-CM

## 2016-07-07 DIAGNOSIS — I10 Essential (primary) hypertension: Secondary | ICD-10-CM | POA: Diagnosis not present

## 2016-07-07 DIAGNOSIS — R269 Unspecified abnormalities of gait and mobility: Secondary | ICD-10-CM | POA: Diagnosis not present

## 2016-07-07 DIAGNOSIS — L8990 Pressure ulcer of unspecified site, unspecified stage: Secondary | ICD-10-CM | POA: Diagnosis not present

## 2016-07-07 DIAGNOSIS — E46 Unspecified protein-calorie malnutrition: Secondary | ICD-10-CM

## 2016-07-07 NOTE — Progress Notes (Signed)
Location:   [facility] Room Number: 1206-P Place of Service:  SNF (31)  Provider: Sidonia Nutter FNP-C   PCP: Marinda Elk, MD Patient Care Team: Marinda Elk, MD as PCP - General (Physician Assistant)  Extended Emergency Contact Information Primary Emergency Contact: Dulce Sellar Address: [address]          [place], [place] [address] Johnnette Litter of [place] Phone: [phone] Relation: Spouse Secondary Emergency Contact: [place] of [place] Phone: [phone] Relation: Son  Code Status: DNR Goals of care:  Advanced Directive information Advanced Directives 07/07/2016  Does patient have an advance directive? Yes  Type of Advance Directive Out of facility DNR (pink MOST or yellow form)  Does patient want to make changes to advanced directive? No - Patient declined  Copy of advanced directive(s) in chart? Yes     Allergies  Allergen Reactions  . Penicillins Hives    Has patient had a PCN reaction causing immediate rash, facial/tongue/throat swelling, SOB or lightheadedness with hypotension: Yes Has patient had a PCN reaction causing severe rash involving mucus membranes or skin necrosis: No Has patient had a PCN reaction that required hospitalization No Has patient had a PCN reaction occurring within the last 10 years. No If all of the above answers are "NO", then may proceed with Cephalosporin use.    Chief Complaint  Patient presents with  . Discharge Note    HPI:  82 y.o. female seen at [facility] for discharge home. She was here for short term rehabilitation post hospital admission from 06/08/16-06/16/16 with tachy-brady syndrome. She had a PPM placed on 06/08/16. Post surgery she had acute respiratory failure and required mechanical ventilation. She was treated with antibiotics for aspiration pneumonia. She also had acute right mid brain infarct and was seen by  neurology. She was seen by cardiology service for her afib. She is seen today in her room. She denies any acute issues this visit.  She has worked with worked well with PT/OT now stable for discharge home.She will be discharged home with Home health PT/OT to continue with ROM, Exercise, Gait stability and muscle strengthening.She will require DME: 16 X 18 standard WC with Cushion, anti tippers, extended brake handles, removable elevating leg rests to enable her to maintain current level of independence. She will also require a semi [facility] bed  with  rails to enable repositioning in bed and elevation of head of the bed due to shortness of breath secondary to COPD.Home health services will be arranged by facility social worker prior to discharge. Prescription medication will be written x 1 month then patient to follow up with PCP in 1-2 weeks. Facility staff report no new concerns.  Past Medical History  Diagnosis Date  . Essential hypertension   . Osteoporosis   . Lower back pain   . History of stomach ulcers     a. s/p surgery ~ 40 yrs ago.  . Polycythemia     a. phlebotomy.  Marland Kitchen COPD (chronic obstructive pulmonary disease) ([place])   . IDA (iron deficiency anemia)     a. 04/2016 CT Abd: no malignancy.  . Weight loss, unintentional   . Thyroid nodule     a. 04/2016 large left thyroid nodule on CT chest  - rec dedicated u/s.  . Pulmonary nodule, left     a. 04/2016 CT chest: 77mm LUL nodule - f/u CT in 12 months (h/o smoking).  Marland Kitchen  PAD (peripheral artery disease) ([place])     a. 04/2016 CT Abd: occluded prox LSFA.  . Superior mesenteric artery stenosis ([place])     a. 04/2016 CT Abd: atheromatous plaque and mural thrombus in SMA.  . Tobacco abuse   . New onset atrial flutter ([place]) 05/2016  . Tachy-brady syndrome ([place]) 05/2016    a. to [facility] for PPM placement    Past Surgical History  Procedure Laterality Date  . Blepharoplasty    . Stomach surgery  1995    for ulcers  . Colonoscopy    .  Cataract extraction w/phaco Right 01/27/2016    Procedure: CATARACT EXTRACTION PHACO AND INTRAOCULAR LENS PLACEMENT (IOC);  Surgeon: Ronnell Freshwater, MD;  Location: [facility];  Service: Ophthalmology;  Laterality: Right;  . Colonoscopy  2005, 2010  . Ep implantable device N/A 06/08/2016    Procedure: Pacemaker Implant;  Surgeon: Evans Lance, MD;  Location: [facility];  Service: Cardiovascular;  Laterality: N/A;      reports that she has been smoking Cigarettes.  She has a 65 pack-year smoking history. She has never used smokeless tobacco. She reports that she does not drink alcohol or use illicit drugs. Social History   Social History  . Marital Status: Married    Spouse Name: N/A  . Number of Children: N/A  . Years of Education: N/A   Occupational History  . Not on file.   Social History Main Topics  . Smoking status: Current Every Day Smoker -- 1.00 packs/day for 65 years    Types: Cigarettes  . Smokeless tobacco: Never Used  . Alcohol Use: No  . Drug Use: No  . Sexual Activity: Not on file   Other Topics Concern  . Not on file   Social History Narrative   Lives locally with husband.  Does not routinely exercise.  Activity limited by progressive L>R LE claudication.   Functional Status Survey:    Allergies  Allergen Reactions  . Penicillins Hives    Has patient had a PCN reaction causing immediate rash, facial/tongue/throat swelling, SOB or lightheadedness with hypotension: Yes Has patient had a PCN reaction causing severe rash involving mucus membranes or skin necrosis: No Has patient had a PCN reaction that required hospitalization No Has patient had a PCN reaction occurring within the last 10 years. No If all of the above answers are "NO", then may proceed with Cephalosporin use.    Pertinent  Health Maintenance Due  Topic Date Due  . DEXA SCAN  10/02/1994  . INFLUENZA VACCINE  07/28/2016  . PNA vac Low Risk Adult (2 of 2 - PCV13)  06/07/2017    Medications:   Medication List       This list is accurate as of: 07/07/16  2:37 PM.  Always use your most recent med list.               amiodarone 200 MG tablet  Commonly known as:  PACERONE  Take 200 mg by mouth daily.     amLODipine 5 MG tablet  Commonly known as:  NORVASC  Take 5 mg by mouth daily.     apixaban 2.5 MG Tabs tablet  Commonly known as:  ELIQUIS  Take 1 tablet (2.5 mg total) by mouth 2 (two) times daily.     arformoterol 15 MCG/2ML Nebu  Commonly known as:  BROVANA  Take 2 mLs (15 mcg total) by nebulization 2 (two) times daily.     atorvastatin  10 MG tablet  Commonly known as:  LIPITOR  Take 1 tablet (10 mg total) by mouth daily at 6 PM.     budesonide 0.5 MG/2ML nebulizer solution  Commonly known as:  PULMICORT  Take 2 mLs (0.5 mg total) by nebulization 2 (two) times daily.     ipratropium-albuterol 0.5-2.5 (3) MG/3ML Soln  Commonly known as:  DUONEB  Take 3 mLs by nebulization every 6 (six) hours as needed.     omeprazole 20 MG capsule  Commonly known as:  PRILOSEC  Take 20 mg by mouth daily. For Jerrye Bushy        Review of Systems  Constitutional: Negative for fever, chills, activity change and appetite change.  HENT: Negative for congestion, rhinorrhea, sinus pressure, sneezing and sore throat.   Eyes: Negative.   Respiratory: Negative for cough, chest tightness, shortness of breath and wheezing.   Cardiovascular: Negative for chest pain and palpitations.  Gastrointestinal: Negative for nausea, vomiting, abdominal pain, diarrhea, constipation and abdominal distention.  Endocrine: Negative.   Genitourinary: Negative for dysuria, urgency, frequency and flank pain.  Musculoskeletal: Positive for gait problem.  Skin:       Left forearm Skin tear. Sacral Pressure ulcer   Neurological: Negative for dizziness, seizures, syncope, light-headedness and headaches.  Psychiatric/Behavioral: Negative for suicidal ideas, hallucinations,  confusion, sleep disturbance, self-injury and agitation. The patient is not nervous/anxious.     Filed Vitals:   07/07/16 1414  BP: 144/73  Pulse: 61  Temp: 97.1 F (36.2 C)  Resp: 20  Height: 5\' 8"  (1.727 m)  Weight: 98 lb (44.453 kg)  SpO2: 95%   Body mass index is 14.9 kg/(m^2). Physical Exam  Constitutional: She is oriented to person, place, and time.  Thin Frail Elderly in no acute distress  HENT:  Head: Normocephalic.  Mouth/Throat: Oropharynx is clear and moist. No oropharyngeal exudate.  Eyes: Conjunctivae and EOM are normal. Pupils are equal, round, and reactive to light. Right eye exhibits no discharge. Left eye exhibits no discharge. No scleral icterus.  Neck: Normal range of motion. No thyromegaly present.  Cardiovascular: Normal rate, regular rhythm, normal heart sounds and intact distal pulses.  Exam reveals no gallop and no friction rub.   No murmur heard. Pulmonary/Chest: Effort normal and breath sounds normal. No respiratory distress. She has no wheezes. She has no rales.  Abdominal: Soft. Bowel sounds are normal. She exhibits no distension. There is no tenderness. There is no rebound and no guarding.  Musculoskeletal: She exhibits no tenderness.  Move X 4 extremities. Abnormal gait   Lymphadenopathy:    She has no cervical adenopathy.  Neurological: She is oriented to person, place, and time.  Skin: Skin is warm and dry. No rash noted. No erythema. No pallor.  Left forearm skin tear progressive healing managed by wound Nurse. No signs of infection. Scaral stage 2 progressive healing.  Psychiatric: She has a normal mood and affect.       Labs reviewed: Basic Metabolic Panel:  Recent Labs  06/10/16 1625 06/10/16 2109 06/11/16 0457 06/11/16 1026 06/12/16 0327 06/15/16 0414  NA  --  134* 136 134* 136 134*  K  --  2.9* 4.7 3.9 4.2 4.3  CL  --  97* 98* 100* 102 98*  CO2  --  28 28 23 27 30   GLUCOSE  --  137* 168* 173* 124* 87  BUN  --  29* 28* 30* 30*  32*  CREATININE  --  1.01* 0.83 0.84 0.73 0.93  CALCIUM  --  9.1 9.2 9.0 9.1 8.4*  MG 1.7 1.8 1.9  --  1.9  --   PHOS 4.9*  --  3.9  --  3.7  --    Liver Function Tests:  Recent Labs  04/16/16 1222 06/08/16 2052 06/15/16 0414  AST 28 54* 16  ALT 24 60* 21  ALKPHOS 54 112 63  BILITOT 0.4 1.1 0.3  PROT 6.5 5.8* 4.7*  ALBUMIN 3.8 2.9* 2.4*   No results for input(s): LIPASE, AMYLASE in the last 8760 hours. No results for input(s): AMMONIA in the last 8760 hours. CBC:  Recent Labs  05/15/16 1345  06/08/16 2052  06/10/16 0205 06/11/16 0457 06/12/16 0327 06/15/16 0414 06/22/16  WBC 5.5  < > 22.8*  < > 11.0* 12.4* 12.5* 8.4 17.8  NEUTROABS 4.6  --  21.4*  --  10.5*  --   --   --   --   HGB 13.8  < > 13.7  < > 11.8* 12.0 12.4 12.0 13.3  HCT 43.0  < > 43.1  < > 36.3 38.4 39.2 37.9 42  MCV 78.7*  < > 85.9  < > 83.4 83.1 85.6 84.2  --   PLT 287  < > 237  < > 190 236 218 226 289  < > = values in this interval not displayed. Cardiac Enzymes: No results for input(s): CKTOTAL, CKMB, CKMBINDEX, TROPONINI in the last 8760 hours. BNP: Invalid input(s): POCBNP CBG:  Recent Labs  06/11/16 0808 06/11/16 1203 06/11/16 1552  GLUCAP 142* 130* 100*    Procedures and Imaging Studies During Stay: Ct Angio Head W/cm &/or Wo Cm  06/09/2016  CLINICAL DATA:  82 year old female status post pacemaker placement yesterday with acute onset left side weakness. Initial encounter. EXAM: CT ANGIOGRAPHY HEAD AND NECK TECHNIQUE: Multidetector CT imaging of the head and neck was performed using the standard protocol during bolus administration of intravenous contrast. Multiplanar CT image reconstructions and MIPs were obtained to evaluate the vascular anatomy. Carotid stenosis measurements (when applicable) are obtained utilizing NASCET criteria, using the distal internal carotid diameter as the denominator. CONTRAST:  50 mL Isovue 370 COMPARISON:  Head CT without contrast 06/08/2016. Chest CT 05/07/2016.  FINDINGS: CT HEAD Brain: Stable cerebral volume. No acute intracranial hemorrhage identified. No acute or evolving cortically based infarct identified. Patchy and confluent bilateral white matter hypodensity appears stable and is nonspecific. Deep gray matter nuclei appear stable and within normal limits. Posterior fossa gray-white matter differentiation appears stable. Calvarium and skull base: Stable and intact. Paranasal sinuses: Stable and well pneumatized visualized paranasal sinuses and mastoids. Orbits: No acute orbit or scalp soft tissue findings. CTA NECK Skeleton: Degenerative changes in the lower cervical spine including multilevel mild spondylolisthesis. Osteopenia. No acute osseous abnormality identified. Other neck: Intubated. Endotracheal tube tip terminates above the carina. Small volume retained secretions in the trachea. Mild fluid in the pharynx. Layering left pleural effusion, likely moderate in size as fluid tracks to the left apex. Centrilobular emphysema in the visible lung parenchyma. Several small areas of patchy ground-glass opacity in the left upper lobe. There is a partially visible more solid-appearing 8 mm lung nodule on series 501, image 1 which is new since May. No superior mediastinal lymphadenopathy. Left thyroid goiter with dystrophic calcification and partial extension into the superior mediastinum. Negative superior parapharyngeal and retropharyngeal spaces. Sublingual space, submandibular glands and parotid glands are within normal limits. Cachexia. No cervical lymphadenopathy identified. Aortic arch: 3 vessel arch configuration with  extensive calcified arch atherosclerosis. Right carotid system: No brachiocephalic artery or right CCA origin stenosis despite calcified plaque. No right CCA stenosis despite calcified plaque. Confluent calcified plaque at the right carotid bifurcation affecting the right ICA origin and to a lesser extent the bulb. Subsequent stenosis is less than  50 % with respect to the distal vessel. Negative cervical right ICA otherwise aside from tortuosity. Left carotid system: No left CCA stenosis despite soft and calcified plaque. Confluent calcified plaque at the posterior left ICA origin. Subsequent stenosis is less than 50 % with respect to the distal vessel. Negative cervical left ICA otherwise. Vertebral arteries: No proximal left subclavian artery stenosis despite soft and calcified plaque. Left vertebral artery origin without stenosis despite soft plaque. Calcified plaque in the left V1, V2, and V3 segments with no hemodynamically significant stenosis. No proximal right subclavian artery stenosis despite calcified plaque. That plaque does affect the right vertebral artery origin with mild stenosis. There is calcified plaque in the right V1 segment with additional mild stenosis. Mild right V3 segment calcified plaque. No right vertebral artery stenosis to the skullbase. CTA HEAD Posterior circulation: Extensive calcified plaque in the bilateral vertebral artery V4 segments. Mild to moderate bilateral distal vertebral artery stenosis but preserved patency of the vertebrobasilar junction. No basilar artery stenosis. SCA and PCA origins are normal. Posterior communicating arteries are diminutive or absent. The left PCA P2 segment is moderately irregular, but there is preserved distal flow (series 5015, image 20). The right PCA branches are mildly irregular. Anterior circulation: Both ICA siphons are patent with extensive bilateral calcified atherosclerosis. Up to mild left siphon stenosis occurs in the proximal supraclinoid segment. On the right there is mild siphon stenosis at the proximal cavernous segment, it and the proximal supraclinoid segment. Both carotid termini remain patent. Normal MCA and ACA origins. Diminutive or absent anterior communicating artery. Bilateral ACA branches are within normal limits. Left MCA M1 segment, bifurcation, and left MCA  branches are patent. There is mild irregularity at the left bifurcation and in the left MCA branches. Right MCA M1 segment, bifurcation, and right MCA branches remain patent. There is mild irregularity at the bifurcation and in the right MCA branches. Venous sinuses: Patent. Anatomic variants: None. Delayed phase: Round homogeneously enhancing 16 mm diameter lesion of the interhemispheric fissure (series 601, image 25) corresponds to the hyperdense lesion described yesterday and appears stable. Minimal to mild associated mass effect with no definite cerebral edema. No other abnormal intracranial enhancement. IMPRESSION: 1. Negative for emergent large vessel occlusion. 2. Extensive atherosclerosis, but no anterior circulation hemodynamically significant stenosis. There are mild bilateral ICA siphon stenoses. 3. Up to moderate stenoses of the bilateral vertebral arteries and left PCA P2 segment in the posterior circulation. 4. No acute or evolving cortically based infarct identified. Advanced white matter signal changes compatible with chronic small vessel disease. 5. Intubated. Small volume retained secretions in the trachea. Partially visible patchy and nodular left upper lung opacity is new since 05/07/2016 compatible with infectious or inflammatory process there is an associated moderate size layering left pleural effusion. Underlying emphysema. 6. Parafalcine 16 mm meningioma at the level of the cingulate gyri with no significant mass effect and no definite cerebral edema. Electronically Signed   By: Genevie Ann M.D.   On: 06/09/2016 15:54   Dg Chest 2 View  06/15/2016  CLINICAL DATA:  82 year old female with a history of hypoxia EXAM: CHEST  2 VIEW COMPARISON:  06/11/2016, 06/10/2016, 06/09/2016 FINDINGS: Cardiomediastinal silhouette unchanged  in size and contour. Calcifications of aortic arch and aorta. Stigmata of emphysema, with increased retrosternal airspace, flattened hemidiaphragms, increased AP diameter,  and hyperinflation on the AP view. Cardiac pacing device left chest wall with 2 leads in place. Interlobular septal thickening throughout. Portions of the lower thorax have been excluded on the lateral view, though there is blunting of the costophrenic sulcus. No pneumothorax. Nodular/airspace opacity in the bilateral hilar regions. IMPRESSION: Advanced emphysema with nodular hilar opacities and interstitial opacities suggesting a combination of edema and/ or superimposed infection. Aeration is similar to the comparison. Likely bilateral small pleural effusions. Atherosclerosis and cardiac pacing device unchanged. Signed, Dulcy Fanny. Earleen Newport, DO [facility] Electronically Signed   By: Corrie Mckusick D.O.   On: 06/15/2016 07:18   Ct Head Wo Contrast  06/08/2016  CLINICAL DATA:  Left-sided weakness EXAM: CT HEAD WITHOUT CONTRAST TECHNIQUE: Contiguous axial images were obtained from the base of the skull through the vertex without intravenous contrast. COMPARISON:  None. FINDINGS: Bony calvarium is intact. No gross soft tissue abnormality is noted. Mild atrophic changes are noted as well as scattered areas of chronic white matter ischemic change. A hemangioma is noted along the falx near the vertex scattered basal ganglia calcifications are seen. IMPRESSION: Atrophic and chronic white matter ischemic changes. Hyperdense lesion involving the falx near the vertex likely representing a meningioma. This measures approximately 18 mm in greatest dimension. No acute abnormality is noted. These results were called by telephone at the time of interpretation on 06/08/2016 at 7:01 pm to Dr. Gifford Shave, who verbally acknowledged these results. Electronically Signed   By: Inez Catalina M.D.   On: 06/08/2016 19:03   Ct Angio Neck W/cm &/or Wo/cm  06/09/2016  CLINICAL DATA:  82 year old female status post pacemaker placement yesterday with acute onset left side weakness. Initial  encounter. EXAM: CT ANGIOGRAPHY HEAD AND NECK TECHNIQUE: Multidetector CT imaging of the head and neck was performed using the standard protocol during bolus administration of intravenous contrast. Multiplanar CT image reconstructions and MIPs were obtained to evaluate the vascular anatomy. Carotid stenosis measurements (when applicable) are obtained utilizing NASCET criteria, using the distal internal carotid diameter as the denominator. CONTRAST:  50 mL Isovue 370 COMPARISON:  Head CT without contrast 06/08/2016. Chest CT 05/07/2016. FINDINGS: CT HEAD Brain: Stable cerebral volume. No acute intracranial hemorrhage identified. No acute or evolving cortically based infarct identified. Patchy and confluent bilateral white matter hypodensity appears stable and is nonspecific. Deep gray matter nuclei appear stable and within normal limits. Posterior fossa gray-white matter differentiation appears stable. Calvarium and skull base: Stable and intact. Paranasal sinuses: Stable and well pneumatized visualized paranasal sinuses and mastoids. Orbits: No acute orbit or scalp soft tissue findings. CTA NECK Skeleton: Degenerative changes in the lower cervical spine including multilevel mild spondylolisthesis. Osteopenia. No acute osseous abnormality identified. Other neck: Intubated. Endotracheal tube tip terminates above the carina. Small volume retained secretions in the trachea. Mild fluid in the pharynx. Layering left pleural effusion, likely moderate in size as fluid tracks to the left apex. Centrilobular emphysema in the visible lung parenchyma. Several small areas of patchy ground-glass opacity in the left upper lobe. There is a partially visible more solid-appearing 8 mm lung nodule on series 501, image 1 which is new since May. No superior mediastinal lymphadenopathy. Left thyroid goiter with dystrophic calcification and partial extension into the superior mediastinum. Negative superior parapharyngeal and  retropharyngeal spaces. Sublingual space, submandibular glands and parotid glands are  within normal limits. Cachexia. No cervical lymphadenopathy identified. Aortic arch: 3 vessel arch configuration with extensive calcified arch atherosclerosis. Right carotid system: No brachiocephalic artery or right CCA origin stenosis despite calcified plaque. No right CCA stenosis despite calcified plaque. Confluent calcified plaque at the right carotid bifurcation affecting the right ICA origin and to a lesser extent the bulb. Subsequent stenosis is less than 50 % with respect to the distal vessel. Negative cervical right ICA otherwise aside from tortuosity. Left carotid system: No left CCA stenosis despite soft and calcified plaque. Confluent calcified plaque at the posterior left ICA origin. Subsequent stenosis is less than 50 % with respect to the distal vessel. Negative cervical left ICA otherwise. Vertebral arteries: No proximal left subclavian artery stenosis despite soft and calcified plaque. Left vertebral artery origin without stenosis despite soft plaque. Calcified plaque in the left V1, V2, and V3 segments with no hemodynamically significant stenosis. No proximal right subclavian artery stenosis despite calcified plaque. That plaque does affect the right vertebral artery origin with mild stenosis. There is calcified plaque in the right V1 segment with additional mild stenosis. Mild right V3 segment calcified plaque. No right vertebral artery stenosis to the skullbase. CTA HEAD Posterior circulation: Extensive calcified plaque in the bilateral vertebral artery V4 segments. Mild to moderate bilateral distal vertebral artery stenosis but preserved patency of the vertebrobasilar junction. No basilar artery stenosis. SCA and PCA origins are normal. Posterior communicating arteries are diminutive or absent. The left PCA P2 segment is moderately irregular, but there is preserved distal flow (series 5015, image 20). The  right PCA branches are mildly irregular. Anterior circulation: Both ICA siphons are patent with extensive bilateral calcified atherosclerosis. Up to mild left siphon stenosis occurs in the proximal supraclinoid segment. On the right there is mild siphon stenosis at the proximal cavernous segment, it and the proximal supraclinoid segment. Both carotid termini remain patent. Normal MCA and ACA origins. Diminutive or absent anterior communicating artery. Bilateral ACA branches are within normal limits. Left MCA M1 segment, bifurcation, and left MCA branches are patent. There is mild irregularity at the left bifurcation and in the left MCA branches. Right MCA M1 segment, bifurcation, and right MCA branches remain patent. There is mild irregularity at the bifurcation and in the right MCA branches. Venous sinuses: Patent. Anatomic variants: None. Delayed phase: Round homogeneously enhancing 16 mm diameter lesion of the interhemispheric fissure (series 601, image 25) corresponds to the hyperdense lesion described yesterday and appears stable. Minimal to mild associated mass effect with no definite cerebral edema. No other abnormal intracranial enhancement. IMPRESSION: 1. Negative for emergent large vessel occlusion. 2. Extensive atherosclerosis, but no anterior circulation hemodynamically significant stenosis. There are mild bilateral ICA siphon stenoses. 3. Up to moderate stenoses of the bilateral vertebral arteries and left PCA P2 segment in the posterior circulation. 4. No acute or evolving cortically based infarct identified. Advanced white matter signal changes compatible with chronic small vessel disease. 5. Intubated. Small volume retained secretions in the trachea. Partially visible patchy and nodular left upper lung opacity is new since 05/07/2016 compatible with infectious or inflammatory process there is an associated moderate size layering left pleural effusion. Underlying emphysema. 6. Parafalcine 16 mm  meningioma at the level of the cingulate gyri with no significant mass effect and no definite cerebral edema. Electronically Signed   By: Genevie Ann M.D.   On: 06/09/2016 15:54   Dg Chest Portable 1 View  06/11/2016  CLINICAL DATA:  Acute respiratory failure, shortness of  breath. EXAM: PORTABLE CHEST 1 VIEW COMPARISON:  Chest x-rays dated 06/10/2016 in 05/2012 2017. FINDINGS: Endotracheal tube is well positioned with tip just above the level of the carina. Enteric tube passes below the diaphragm. Left chest wall pacemaker/ ICD appears stable in position. Cardiomediastinal silhouette is stable in size and configuration. Lungs appear stable compared to yesterday's chest x-ray. There is improved aeration at the left lung base compared to the exam of 06/09/2016. Mild central pulmonary vascular congestion persists. Lungs are again hyperexpanded suggesting COPD. Probable associated chronic bronchitic changes centrally. No pleural effusion or pneumothorax seen. IMPRESSION: Lungs appear stable compared to yesterday's chest x-ray. Persistent mild central pulmonary vascular congestion. Hyperexpanded lungs suggesting COPD. Suspect associated chronic bronchitic changes centrally. Endotracheal tube well positioned with tip just above the level of the carina. Electronically Signed   By: Franki Cabot M.D.   On: 06/11/2016 07:40   Dg Chest Port 1 View  06/10/2016  CLINICAL DATA:  Hypoxia EXAM: PORTABLE CHEST 1 VIEW COMPARISON:  June 09, 2016 FINDINGS: Endotracheal tube tip is 3.2 cm above the carina. Nasogastric tube tip and side port are in the stomach. Pacemaker leads are attached to the right atrium and right ventricle. There is no demonstrable pneumothorax. There has been interval clearing of infiltrate from the left base with only mild left base atelectasis remaining. There is mild perihilar interstitial edema, slightly less compared to 1 day prior. No new opacity. Lungs are hyperexpanded. Heart size and pulmonary  vascularity are within normal limits. No adenopathy evident. IMPRESSION: Evidence of clearing of infiltrate from left base as well as partial but incomplete clearing of perihilar interstitial edema. There is residual atelectasis in the left base and mild perihilar interstitial edema currently. No new opacity. No change in cardiac silhouette. Tube positions as described without pneumothorax. Electronically Signed   By: Lowella Grip III M.D.   On: 06/10/2016 07:04   Dg Chest Port 1 View  06/09/2016  CLINICAL DATA:  Respiratory failure. EXAM: PORTABLE CHEST 1 VIEW COMPARISON:  06/08/2016. FINDINGS: Endotracheal tube is 2.2 cm above the carina. Cardiac pacer with lead tips in right atrium right ventricle. Right perihilar and left lower lobe infiltrates are present. Small left-sided pleural effusion. No pneumothorax. Surgical clips upper abdomen. IMPRESSION: 1.  Endotracheal tube 2.2 cm above the carina. 2. Right perihilar and diffuse left lung infiltrates. Small left pleural effusion. 3. Cardiac pacer stable position.  Heart size stable. Electronically Signed   By: Marcello Moores  Register   On: 06/09/2016 07:01   Dg Chest Port 1 View  06/08/2016  CLINICAL DATA:  Endotracheal tube placement. EXAM: PORTABLE CHEST 1 VIEW COMPARISON:  06/08/2016 FINDINGS: New endotracheal tube tip projects 5.7 cm above the carina. Cardiac silhouette is borderline enlarged. No gross mediastinal or hilar masses. Lungs are hyperexpanded with irregularly thickened interstitial markings. Hazy airspace opacification is noted on the left most evident centrally accentuated by rotation to the right. This is similar to the earlier exam. Left-sided pacemaker is stable. IMPRESSION: 1. New endotracheal tip projects 5.7 cm above the carina. 2. No significant change in the appearance of the lungs. Findings may reflect a combination of interstitial and mild hazy airspace edema from congestive heart failure. There is underlying COPD. Electronically  Signed   By: Lajean Manes M.D.   On: 06/08/2016 18:39   Dg Chest Port 1 View  06/08/2016  CLINICAL DATA:  Unresponsive with left-sided weakness following pacemaker placement. EXAM: PORTABLE CHEST 1 VIEW COMPARISON:  Radiographs 04/04/2010.  CT 05/07/2016.  FINDINGS: 1727 hours. Left subclavian pacemaker leads project over the right atrium and right ventricle. The heart size and mediastinal contours are stable allowing for patient rotation to the right. There is diffuse aortic atherosclerosis. There is vascular congestion with new asymmetric perihilar and lower lobe airspace disease on the left. No pneumothorax or significant pleural effusion identified. The bones appear unchanged. IMPRESSION: 1. The left subclavian pacemaker leads appear satisfactorily positioned in the right atrium and right ventricle. No pneumothorax. 2. New vascular congestion with asymmetric airspace disease on the left consistent with asymmetric edema or aspiration. Electronically Signed   By: Richardean Sale M.D.   On: 06/08/2016 17:44   Dg Abd Portable 1v  06/09/2016  CLINICAL DATA:  Nasogastric tube placement. EXAM: PORTABLE ABDOMEN - 1 VIEW COMPARISON:  CT 05/07/16 FINDINGS: Nasogastric tube tip is at the level of the stomach. Epigastric sutures and clips related to previous stomach surgery. Contrast in the urinary collecting system related to recent CTA. Lung bases are grossly clear. Nonobstructive bowel gas pattern. Lumbar scoliosis. IMPRESSION: Nasogastric tube with tip at the stomach. Electronically Signed   By: Monte Fantasia M.D.   On: 06/09/2016 18:54    Assessment/Plan:   COPD Stable. Status post short term rehabilitation post hospital admission from 06/08/16-06/16/16 with tachy-brady syndrome. She had a PPM placed on 06/08/16. Post surgery she had acute respiratory failure and required mechanical ventilation. She was treated with antibiotics for aspiration pneumonia. She is on room air.Exam findings negative for cough,  rales or wheezing. Continue on Aformoterol 15 mcg / 2 ml, Pulmicort 0.5-2.5(3) mg Neb soln and Duoneb. She will require a semi [facility] bed  with  rails to enable repositioning in bed and elevation of head of the bed due to shortness of breath.  HTN B/p stable. Continue on amlodipine 5 mg Tablet   CVA Had acute right mid brain infarct and was seen by neurology.Continue on Eliquis 2.5 mg tablet. Follow up with Neurology.   Hyperlipidemia  Continue on Atorvastatin 10 mg tablet. Heart healthy diet.   Protein Malnutrition  Continue with protein supplements. BMP in 1-2 weeks with PCP   Sacral Stage 2 Pressure ulcer  Progressive healing. [place] Nurse for wound management   Afib  Continue on amiodarone 200 mg tablet  Left forearm skin Tear progressive healing. [place] Nurse for skin tear management   Abnormal gait   Has worked with PT/OT She will be discharged home with Home health PT/OT to continue with ROM, Exercise, Gait stability and muscle strengthening.She will require DME: 16 X 18 standard WC with Cushion, anti tippers, extended brake handles, removable elevating leg rests to enable her to maintain current level of independence. Fall and safety precautions.   Patient is being discharged with the following home health services:    Patient is being discharged with the following durable medical equipment:    16 X 18 standard WC with Cushion, anti tippers, extended brake handles, removable elevating leg rests to enable her to maintain current level of independence.   She will also require a semi [facility] bed  with  rails to enable repositioning in bed and elevation of head of the bed due to shortness of breath secondary to COPD.   Patient has been advised to f/u with their PCP in 1-2 weeks to bring them up to date on their rehab stay.  Social services at facility was responsible for arranging this appointment.  Pt was provided with a 30 day supply of prescriptions for  medications and refills must be obtained from their PCP.  For controlled substances, a more limited supply may be provided adequate until PCP appointment only.  Future labs/tests needed:  CBC, BMP in 1-2 weeks with PCP

## 2016-07-08 ENCOUNTER — Ambulatory Visit (INDEPENDENT_AMBULATORY_CARE_PROVIDER_SITE_OTHER): Payer: Medicare HMO | Admitting: Internal Medicine

## 2016-07-08 ENCOUNTER — Encounter: Payer: Self-pay | Admitting: Internal Medicine

## 2016-07-08 VITALS — BP 128/58 | HR 60 | Ht 66.0 in | Wt 85.0 lb

## 2016-07-08 DIAGNOSIS — R634 Abnormal weight loss: Secondary | ICD-10-CM

## 2016-07-08 DIAGNOSIS — Z720 Tobacco use: Secondary | ICD-10-CM | POA: Insufficient documentation

## 2016-07-08 DIAGNOSIS — R64 Cachexia: Secondary | ICD-10-CM | POA: Insufficient documentation

## 2016-07-08 DIAGNOSIS — J432 Centrilobular emphysema: Secondary | ICD-10-CM

## 2016-07-08 DIAGNOSIS — J439 Emphysema, unspecified: Secondary | ICD-10-CM | POA: Insufficient documentation

## 2016-07-08 NOTE — Progress Notes (Signed)
[facility] Consultation      MRN# [id number] Lydia Lydia [name] May 20, 1927 Referring Physician - Dr. Bridgett Lydia [name] (hospitalist) PMD - Dr. Carrie [name]  CC: Chief Complaint  Lydia [name] presents with  . Hospitalization Follow-up    pt states breathing is fair since hosp. c/o weakness & fatigue. denies sob, cough, wheezing or cp/tightness.      History: 82 yo with Hx of COPD, Afib, tachy-brady syndrome seen as hospital follow up for COPD. Hospitalization admission 06/08/16-06/16/16 with tachy-brady syndrome. She had a PPM placed on 06/08/16.Post surgery she had acute respiratory failure and required mechanical ventilation. She was treated with antibiotics for aspiration pneumonia. She also had acute right mid brain infarct and was seen by neurology. She was seen by cardiology service for her afib. She has worked with worked well with PT/OT at [facility] facility since her discharge, she is going to be discharge from rehab facility tomorrow, 07/08/16 to home with PT/OT. Today with complaints with weakness, and fatigue Denies sob, wheezing, fever and chills. Former smoker, quit on 06/04/16.  Previously smoked 1ppd x 68 years.  Diagnosed with COPD by PMD in 2007, but continue to smoke until recently. Currently on dunoeb prn, Brovana BID, Pulmicort BID. Also, per your notes, Lydia [name] has a stage II sacral decubitus ulcer for which home health nursing will follow her for    Medication:   Current Outpatient Rx  Name  Route  Sig  Dispense  Refill  . amiodarone (PACERONE) 200 MG tablet   Oral   Take 200 mg by mouth daily.         Marland Kitchen amLODipine (NORVASC) 5 MG tablet   Oral   Take 5 mg by mouth daily.          Marland Kitchen apixaban (ELIQUIS) 2.5 MG TABS tablet   Oral   Take 1 tablet (2.5 mg total) by mouth 2 (two) times daily.         Marland Kitchen arformoterol (BROVANA) 15 MCG/2ML NEBU   Nebulization   Take 2 mLs (15 mcg total) by nebulization 2 (two) times daily.         Marland Kitchen atorvastatin (LIPITOR) 10 MG  tablet   Oral   Take 1 tablet (10 mg total) by mouth daily at 6 PM.         . budesonide (PULMICORT) 0.5 MG/2ML nebulizer solution   Nebulization   Take 2 mLs (0.5 mg total) by nebulization 2 (two) times daily.         Marland Kitchen ipratropium-albuterol (DUONEB) 0.5-2.5 (3) MG/3ML SOLN   Nebulization   Take 3 mLs by nebulization every 6 (six) hours as needed.         Marland Kitchen omeprazole (PRILOSEC) 20 MG capsule   Oral   Take 20 mg by mouth daily. For Lydia Lydia [name]            Review of Systems  Respiratory: Positive for cough and shortness of breath. Negative for hemoptysis, sputum production and wheezing.   Cardiovascular: Negative for chest pain and palpitations.  Gastrointestinal: Positive for nausea. Negative for vomiting, abdominal pain, diarrhea, constipation and blood in stool.  Genitourinary: Negative for dysuria.  Musculoskeletal: Positive for joint pain.  Skin: Negative for rash.  Neurological: Negative for dizziness and headaches.  Endo/Heme/Allergies: Does not bruise/bleed easily.  Psychiatric/Behavioral: Negative for depression.      Allergies:  Penicillins  Physical Examination:  VS: BP 128/58 mmHg  Pulse 60  Ht 5\' 6"  (1.676 m)  Wt 85 lb (38.556 kg)  BMI  13.73 kg/m2  SpO2 94%  General Appearance: No wheelchair, moderate cachexia appearance HEENT: PERRLA, no ptosis, no other lesions noticed Pulmonary:normal breath sounds., diaphragmatic excursion normal.No wheezing, No rales   Cardiovascular:  Normal S1,S2.  No m/r/g.     Abdomen:Exam: Benign, Soft, non-tender, No masses  Skin:   warm, no rashes, no ecchymosis  Extremities: normal, no cyanosis, clubbing, warm with normal capillary refill.      Rad results: (Lydia following images and results were reviewed by Dr. Stevenson Clinch on 07/08/2016). CT Chest 04/2016 EXAM: CT CHEST, ABDOMEN, AND PELVIS WITH CONTRAST  TECHNIQUE: Multidetector CT imaging of Lydia chest, abdomen and pelvis was performed following Lydia standard protocol  during bolus administration of intravenous contrast.  CONTRAST: 81mL ISOVUE-300 IOPAMIDOL (ISOVUE-300) INJECTION 61%  COMPARISON: Multiple exams, including 04/04/2010 and 04/14/2016  FINDINGS: CT CHEST FINDINGS  Mediastinum/Nodes: Enlarged left thyroid lobe with a 3.1 by 2.2 cm nodule with central calcification and heterogeneous enhancement extending posterior to Lydia trachea in Lydia upper mediastinum, and effacing and displacing Lydia esophagus to Lydia right.  Coronary, aortic arch, and branch vessel atherosclerotic vascular disease. Ectatic pulmonary artery and ectatic thoracic aorta. Calcifications along Lydia mitral and aortic valve.  No pathologic thoracic adenopathy.  Lungs/Pleura: Biapical pleural parenchymal scarring. Severe centrilobular emphysema. 6 by 4 mm indistinct subpleural nodule along Lydia as ago esophageal recess, image 87/4. 7 by 4 mm left upper lobe subpleural pulmonary nodule medially on image 28/4.  Mild peripheral bronchiectasis and airway plugging distally in Lydia lingula.  Musculoskeletal: Minimal superior endplate concavity at Lydia T3 vertebral level likely from a remote compression fracture. Cachexia.  CT ABDOMEN PELVIS FINDINGS  Hepatobiliary: Upper normal common bile duct size at 9 mm diameter.  Pancreas: Borderline dilated dorsal pancreatic duct in Lydia pancreatic head.  Spleen: 1.5 by 1.4 by 1.9 cm hypodense lesion of Lydia anterior spleen, internal density 102 Hounsfield units postcontrast. Partially exophytic. On delayed images this has an internal density of 113 Hounsfield units, similar to Lydia surrounding spleen.  Adrenals/Urinary Tract: Indistinct adrenal glands but no discrete adrenal mass.  No hydronephrosis or hydroureter. Lydia ureters are difficult to follow due to Lydia underlying cachexia and indistinctness of surrounding fat planes. No parenchymal lesion of Lydia kidneys. Urinary bladder unremarkable.  Stomach/Bowel: No  dilated bowel. Paucity of intra-abdominal adipose tissue aching separation of adjacent structures problematic.  Vascular/Lymphatic: Aortoiliac atherosclerotic vascular disease. There is some atheromatous plaque and mural thrombus in Lydia superior mesenteric artery which is otherwise patent. Bilateral single renal arteries appear patent than Lydia IMA appears patent.  Lydia left superficial femoral artery is occluded proximally although Lydia deep branch is patent.  I do not see definite retroperitoneal adenopathy.  Reproductive: Retroverted uterus with multiple large calcified masses compatible with fibroids. Difficult to discern Lydia ovaries separate from Lydia uterus.  Other: Diffuse subcutaneous, mesenteric, and retroperitoneal edema/stranding which in combination with Lydia general cachexia tends to make adjacent structures indistinct.  Musculoskeletal: Levoconvex scoliosis at Lydia thoracolumbar junction with dextroconvex scoliosis in Lydia lumbar spine. Intervertebral disc narrowing and endplate sclerosis. Prominent axial loss of articular space in both hips. There is left foraminal stenosis at Lydia L4-5 level along with central narrowing of Lydia thecal sac.  IMPRESSION: 1. A specific cause for Lydia Lydia [name]' s weight loss is not identified. No obvious active malignancy. 2. Sensitivity is adversely affected by Lydia Lydia [name]'s underlying cachexia along with indistinct stranding in Lydia subcutaneous, mesenteric, and retroperitoneal tissues making separation of adjacent structures problematic. 3. There is a large  nodule of Lydia left thyroid gland extending posteriorly into Lydia upper mediastinum. Consider thyroid ultrasound for further characterization. This has dense central calcification. 4. Severe emphysema. 5. Coronary, aortic arch, and branch vessel atherosclerotic vascular disease. 6. 5 mm nodule in Lydia as ago esophageal recess and 5 mm nodule in Lydia left upper lobe. No follow-up  needed if Lydia [name] is low-risk (and has no known or suspected primary neoplasm). Non-contrast chest CT can be considered in 12 months if Lydia [name] is high-risk. This recommendation follows Lydia consensus statement: Guidelines for Management of Incidental Pulmonary Nodules Detected on CT Images:From Lydia Fleischner Society 2017; published online before print (10.1148/radiol.SG:5268862). 7. 1.9 cm hypodense lesion in Lydia anterior spleen is technically nonspecific. This demonstrates diffuse delayed enhancement similar to Lydia rest of Lydia spleen but is hypoenhancing on portal venous phase images. This could be an atypical hemangioma and may warrant observation. 8. Aortoiliac atherosclerotic vascular disease. Occluded proximal left superficial femoral artery although Lydia deep branch is patent. 9. Multiple large calcified uterine fibroids. 10. Thoracolumbar scoliosis along with spondylosis and degenerative disc disease causing impingement at L4-5. 11. Mitral and aortic valve calcifications. 12. Peripheral bronchiectasis and airway plugging in Lydia lingula.     Assessment and Plan: 82 year old female seen in consultation for hospital follow-up, COPD. Tobacco abuse Tobacco Cessation - Counseling regarding benefits of smoking cessation strategies was provided for more than 12 min. - Educated that at this time smoking- cessation represents Lydia single most important step that Lydia [name] can take to enhance Lydia length and quality of live. - Educated Lydia [name] regarding alternatives of behavior interventions, pharmacotherapy including NRT and non-nicotine therapy such, and combinations of both. - Lydia [name] at this time: quit smoking as of 06/04/16  Cachexia ([place]) Multifactorial: Poor appetite, and deconditioning, malnutrition, possible COPD cachexia  At this time I believe that Lydia level of COPD is adding to her cachexia. COPD cachexia is a diagnosis of exclusion. But in this Lydia [name], it is highly considerable  that Lydia chronic inflammatory process of Lydia obstructive lung disease could be adding to her cachexia. She has taken Lydia first step in stopping any forms of tobacco towards possible weight gain. At this time I have educated Lydia Lydia [name] and her husband about cachexia and COPD Lydia [name]'s. Do not recommend a appetite stimulant at this time. Small frequent meals are of more clinical use versus large meals.  Plan: -Continue with COPD inhalers, continue with tobacco avoidance -Adequate nutrition, small frequent meals.  Emphysema lung ([place]) Lydia [name] with prolonged history of COPD. Per review of her CAT scan, she has most likely a mix COPD picture: Emphysema and bronchitis, more Lydia emphysematous type. Structurally from review of her CAT scan she has severe centrilobular emphysema more prominent in Lydia bases. I have reviewed medications with Lydia Lydia [name], and administration, and frequency of medication use with Lydia Lydia [name] and her husband.  At this time I believe that nebulizers versus inhalers is of better compliance for this Lydia [name].  Plan: -Continue with Pulmicort, Brovana, and when necessary DuoNeb's -Incentive spirometry 10-15 times per day, or as tolerated -Use walker as tolerated -In office spirometry prior to next office visit    Updated Medication List Outpatient Encounter Prescriptions as of 07/08/2016  Medication Sig  . amiodarone (PACERONE) 200 MG tablet Take 200 mg by mouth daily.  Marland Kitchen amLODipine (NORVASC) 5 MG tablet Take 5 mg by mouth daily.   Marland Kitchen apixaban (ELIQUIS) 2.5 MG TABS tablet Take 1 tablet (2.5 mg total) by mouth 2 (two) times daily.  Marland Kitchen  arformoterol (BROVANA) 15 MCG/2ML NEBU Take 2 mLs (15 mcg total) by nebulization 2 (two) times daily.  Marland Kitchen atorvastatin (LIPITOR) 10 MG tablet Take 1 tablet (10 mg total) by mouth daily at 6 PM.  . budesonide (PULMICORT) 0.5 MG/2ML nebulizer solution Take 2 mLs (0.5 mg total) by nebulization 2 (two) times daily.  Marland Kitchen ipratropium-albuterol (DUONEB)  0.5-2.5 (3) MG/3ML SOLN Take 3 mLs by nebulization every 6 (six) hours as needed.  Marland Kitchen omeprazole (PRILOSEC) 20 MG capsule Take 20 mg by mouth daily. For [place]erd   No facility-administered encounter medications on file as of 07/08/2016.    Orders for this visit: No orders of Lydia defined types were placed in this encounter.    Thank  you for Lydia visitation and for allowing  [facility] to assist in Lydia care of your Lydia [name]. Our recommendations are noted above.  Please contact us if we can be of further service.  Vilinda Boehringer, MD [facility] Office Number: [phone]  Note: This note was prepared with Dragon dictation along with smaller phrase technology. Any transcriptional errors that result from this process are unintentional.

## 2016-07-08 NOTE — Assessment & Plan Note (Signed)
Patient with prolonged history of COPD. Per review of her CAT scan, she has most likely a mix COPD picture: Emphysema and bronchitis, more the emphysematous type. Structurally from review of her CAT scan she has severe centrilobular emphysema more prominent in the bases. I have reviewed medications with the patient, and administration, and frequency of medication use with the patient and her husband.  At this time I believe that nebulizers versus inhalers is of better compliance for this patient.  Plan: -Continue with Pulmicort, Brovana, and when necessary DuoNeb's -Incentive spirometry 10-15 times per day, or as tolerated -Use walker as tolerated -In office spirometry prior to next office visit

## 2016-07-08 NOTE — Patient Instructions (Signed)
Follow up with Dr. Stevenson Clinch in: 3 months - incentive spirometry 10-15 times per day - cont with pulmicort, brovana, and PRN duonebs - cont with diet - increase frequency of small meals/snacks - follow recs from Home health and physicial therapy - use walker as toleraterd - in office spirometry prior to next office visit.

## 2016-07-08 NOTE — Assessment & Plan Note (Signed)
Multifactorial: Poor appetite, and deconditioning, malnutrition, possible COPD cachexia  At this time I believe that the level of COPD is adding to her cachexia. COPD cachexia is a diagnosis of exclusion. But in this patient, it is highly considerable that the chronic inflammatory process of the obstructive lung disease could be adding to her cachexia. She has taken the first step in stopping any forms of tobacco towards possible weight gain. At this time I have educated the patient and her husband about cachexia and COPD patient's. Do not recommend a appetite stimulant at this time. Small frequent meals are of more clinical use versus large meals.  Plan: -Continue with COPD inhalers, continue with tobacco avoidance -Adequate nutrition, small frequent meals.

## 2016-07-08 NOTE — Assessment & Plan Note (Signed)
Tobacco Cessation - Counseling regarding benefits of smoking cessation strategies was provided for more than 12 min. - Educated that at this time smoking- cessation represents the single most important step that patient can take to enhance the length and quality of live. - Educated patient regarding alternatives of behavior interventions, pharmacotherapy including NRT and non-nicotine therapy such, and combinations of both. - Patient at this time: quit smoking as of 06/04/16

## 2016-07-09 ENCOUNTER — Other Ambulatory Visit: Payer: Self-pay

## 2016-07-09 ENCOUNTER — Telehealth: Payer: Self-pay | Admitting: Internal Medicine

## 2016-07-09 DIAGNOSIS — J439 Emphysema, unspecified: Secondary | ICD-10-CM

## 2016-07-09 NOTE — Telephone Encounter (Signed)
Pt came home today from rehab. She was doing neb treatments in rehab. VM agreed & wanted her to cont on the treatments. She was not sent home with a neb machine. Pt was advised at last OV to call us to order one if rehab did not send her home with one. Ordered placed. Nothing further needed.

## 2016-07-09 NOTE — Telephone Encounter (Signed)
Pt spouse called, has question regarding nebulizer. Please call.

## 2016-07-09 NOTE — Telephone Encounter (Signed)
Spoke with pt who states her husband is not home at the moment. She states she would rather me talk with him. Will await call back.

## 2016-07-12 DIAGNOSIS — I10 Essential (primary) hypertension: Secondary | ICD-10-CM | POA: Diagnosis not present

## 2016-07-12 DIAGNOSIS — M4806 Spinal stenosis, lumbar region: Secondary | ICD-10-CM | POA: Diagnosis not present

## 2016-07-12 DIAGNOSIS — S41112D Laceration without foreign body of left upper arm, subsequent encounter: Secondary | ICD-10-CM | POA: Diagnosis not present

## 2016-07-12 DIAGNOSIS — S81812D Laceration without foreign body, left lower leg, subsequent encounter: Secondary | ICD-10-CM | POA: Diagnosis not present

## 2016-07-12 DIAGNOSIS — M810 Age-related osteoporosis without current pathological fracture: Secondary | ICD-10-CM | POA: Diagnosis not present

## 2016-07-12 DIAGNOSIS — I480 Paroxysmal atrial fibrillation: Secondary | ICD-10-CM | POA: Diagnosis not present

## 2016-07-12 DIAGNOSIS — J449 Chronic obstructive pulmonary disease, unspecified: Secondary | ICD-10-CM | POA: Diagnosis not present

## 2016-07-12 DIAGNOSIS — I739 Peripheral vascular disease, unspecified: Secondary | ICD-10-CM | POA: Diagnosis not present

## 2016-07-14 ENCOUNTER — Telehealth: Payer: Self-pay | Admitting: Internal Medicine

## 2016-07-14 NOTE — Telephone Encounter (Signed)
Spoke with husband and informed him to contact pt's PCP since we don't/haven't sent Lipitor for pt. Nothing further needed.

## 2016-07-14 NOTE — Telephone Encounter (Signed)
Pt spouse calling asking if patient needs to still be on this medication and if so please call in a refill.   *STAT* If patient is at the pharmacy, call can be transferred to refill team.   1. Which medications need to be refilled? (please list name of each medication and dose if known) Lipitor 10 mg   2. Which pharmacy/location (including street and city if local pharmacy) is medication to be sent to? [facility]  3. Do they need a 30 day or 90 day supply? 90 day

## 2016-07-30 ENCOUNTER — Ambulatory Visit: Payer: Medicare HMO | Admitting: Neurology

## 2016-08-06 ENCOUNTER — Telehealth: Payer: Self-pay | Admitting: *Deleted

## 2016-08-06 NOTE — Telephone Encounter (Signed)
Patient's husband called and stated that he had some concerns with patient's Nebulizer medications. Patient is not an office patient and was discharged from a facility last month. Instructed him to call her Pulmonologist or PCP. He agreed.

## 2016-08-24 ENCOUNTER — Telehealth: Payer: Self-pay | Admitting: Internal Medicine

## 2016-08-24 NOTE — Telephone Encounter (Signed)
Pt spouse calling stating pt therapist came by and did "the finger reading"  It was below 78-80, has not gone up above 80 They were advise to go to ED  But patient spouse is stating he is not going to take her He cut the amiodarone in half to help with patient constipation  He states he is just letting us know about this Needs to know if he really needs to take her to ED or not Please call back

## 2016-08-25 ENCOUNTER — Emergency Department: Payer: Medicare HMO

## 2016-08-25 ENCOUNTER — Encounter: Payer: Self-pay | Admitting: Internal Medicine

## 2016-08-25 ENCOUNTER — Inpatient Hospital Stay
Admission: EM | Admit: 2016-08-25 | Discharge: 2016-08-29 | DRG: 871 | Disposition: A | Discharge status: Home Health | Payer: Medicare HMO | Attending: Internal Medicine | Admitting: Internal Medicine

## 2016-08-25 ENCOUNTER — Ambulatory Visit (INDEPENDENT_AMBULATORY_CARE_PROVIDER_SITE_OTHER): Payer: Medicare HMO | Admitting: Internal Medicine

## 2016-08-25 ENCOUNTER — Encounter: Payer: Self-pay | Admitting: Emergency Medicine

## 2016-08-25 VITALS — BP 106/64 | HR 74 | Ht 68.0 in | Wt 85.0 lb

## 2016-08-25 DIAGNOSIS — I499 Cardiac arrhythmia, unspecified: Secondary | ICD-10-CM | POA: Diagnosis not present

## 2016-08-25 DIAGNOSIS — Z681 Body mass index (BMI) 19 or less, adult: Secondary | ICD-10-CM

## 2016-08-25 DIAGNOSIS — K5900 Constipation, unspecified: Secondary | ICD-10-CM | POA: Diagnosis present

## 2016-08-25 DIAGNOSIS — Y95 Nosocomial condition: Secondary | ICD-10-CM | POA: Diagnosis present

## 2016-08-25 DIAGNOSIS — J9621 Acute and chronic respiratory failure with hypoxia: Secondary | ICD-10-CM | POA: Diagnosis present

## 2016-08-25 DIAGNOSIS — Z79899 Other long term (current) drug therapy: Secondary | ICD-10-CM

## 2016-08-25 DIAGNOSIS — J441 Chronic obstructive pulmonary disease with (acute) exacerbation: Secondary | ICD-10-CM | POA: Diagnosis present

## 2016-08-25 DIAGNOSIS — Z803 Family history of malignant neoplasm of breast: Secondary | ICD-10-CM | POA: Diagnosis not present

## 2016-08-25 DIAGNOSIS — R64 Cachexia: Secondary | ICD-10-CM | POA: Diagnosis present

## 2016-08-25 DIAGNOSIS — Z7951 Long term (current) use of inhaled steroids: Secondary | ICD-10-CM | POA: Diagnosis not present

## 2016-08-25 DIAGNOSIS — Z7901 Long term (current) use of anticoagulants: Secondary | ICD-10-CM | POA: Diagnosis not present

## 2016-08-25 DIAGNOSIS — I739 Peripheral vascular disease, unspecified: Secondary | ICD-10-CM | POA: Diagnosis present

## 2016-08-25 DIAGNOSIS — I110 Hypertensive heart disease with heart failure: Secondary | ICD-10-CM | POA: Diagnosis present

## 2016-08-25 DIAGNOSIS — R009 Unspecified abnormalities of heart beat: Secondary | ICD-10-CM

## 2016-08-25 DIAGNOSIS — Z950 Presence of cardiac pacemaker: Secondary | ICD-10-CM

## 2016-08-25 DIAGNOSIS — I4891 Unspecified atrial fibrillation: Secondary | ICD-10-CM | POA: Diagnosis present

## 2016-08-25 DIAGNOSIS — R609 Edema, unspecified: Secondary | ICD-10-CM

## 2016-08-25 DIAGNOSIS — J440 Chronic obstructive pulmonary disease with acute lower respiratory infection: Secondary | ICD-10-CM | POA: Diagnosis present

## 2016-08-25 DIAGNOSIS — Z880 Allergy status to penicillin: Secondary | ICD-10-CM | POA: Diagnosis not present

## 2016-08-25 DIAGNOSIS — Z8711 Personal history of peptic ulcer disease: Secondary | ICD-10-CM

## 2016-08-25 DIAGNOSIS — I5032 Chronic diastolic (congestive) heart failure: Secondary | ICD-10-CM | POA: Diagnosis present

## 2016-08-25 DIAGNOSIS — J189 Pneumonia, unspecified organism: Secondary | ICD-10-CM | POA: Diagnosis present

## 2016-08-25 DIAGNOSIS — Z66 Do not resuscitate: Secondary | ICD-10-CM | POA: Diagnosis present

## 2016-08-25 DIAGNOSIS — Z87891 Personal history of nicotine dependence: Secondary | ICD-10-CM

## 2016-08-25 DIAGNOSIS — A419 Sepsis, unspecified organism: Principal | ICD-10-CM | POA: Diagnosis present

## 2016-08-25 DIAGNOSIS — M810 Age-related osteoporosis without current pathological fracture: Secondary | ICD-10-CM | POA: Diagnosis present

## 2016-08-25 DIAGNOSIS — E43 Unspecified severe protein-calorie malnutrition: Secondary | ICD-10-CM | POA: Diagnosis present

## 2016-08-25 LAB — COMPREHENSIVE METABOLIC PANEL
ALBUMIN: 2.6 g/dL — AB (ref 3.5–5.0)
ALK PHOS: 84 U/L (ref 38–126)
ALT: 28 U/L (ref 14–54)
AST: 27 U/L (ref 15–41)
Anion gap: 9 (ref 5–15)
BUN: 29 mg/dL — ABNORMAL HIGH (ref 6–20)
CALCIUM: 8.3 mg/dL — AB (ref 8.9–10.3)
CO2: 25 mmol/L (ref 22–32)
CREATININE: 0.71 mg/dL (ref 0.44–1.00)
Chloride: 101 mmol/L (ref 101–111)
GFR calc Af Amer: >60 mL/min (ref >60)
GFR calc non Af Amer: >60 mL/min (ref >60)
GLUCOSE: 97 mg/dL (ref 65–99)
Potassium: 4.7 mmol/L (ref 3.5–5.1)
SODIUM: 135 mmol/L (ref 135–145)
Total Bilirubin: 0.6 mg/dL (ref 0.3–1.2)
Total Protein: 6.2 g/dL — ABNORMAL LOW (ref 6.5–8.1)

## 2016-08-25 LAB — CBC WITH DIFFERENTIAL/PLATELET
Basophils Absolute: 0 10*3/uL (ref 0–0.1)
Basophils Relative: 0 %
EOS ABS: 0 10*3/uL (ref 0–0.7)
EOS PCT: 0 %
HCT: 36.3 % (ref 35.0–47.0)
Hemoglobin: 12.6 g/dL (ref 12.0–16.0)
LYMPHS ABS: 0.3 10*3/uL — AB (ref 1.0–3.6)
Lymphocytes Relative: 3 %
MCH: 31.1 pg (ref 26.0–34.0)
MCHC: 34.7 g/dL (ref 32.0–36.0)
MCV: 89.4 fL (ref 80.0–100.0)
MONOS PCT: 7 %
Monocytes Absolute: 0.8 10*3/uL (ref 0.2–0.9)
Neutro Abs: 9.1 10*3/uL — ABNORMAL HIGH (ref 1.4–6.5)
Neutrophils Relative %: 90 %
PLATELETS: 315 10*3/uL (ref 150–440)
RBC: 4.06 MIL/uL (ref 3.80–5.20)
RDW: 15.5 % — ABNORMAL HIGH (ref 11.5–14.5)
WBC: 10.3 10*3/uL (ref 3.6–11.0)

## 2016-08-25 LAB — BRAIN NATRIURETIC PEPTIDE: B Natriuretic Peptide: 301 pg/mL — ABNORMAL HIGH (ref 0.0–100.0)

## 2016-08-25 LAB — TROPONIN I

## 2016-08-25 MED ORDER — ONDANSETRON HCL 4 MG/2ML IJ SOLN: 4.0000 mg | Freq: Four times a day (QID) | INTRAMUSCULAR | Status: DC | PRN

## 2016-08-25 MED ORDER — ACETAMINOPHEN 325 MG PO TABS
650.0000 mg | ORAL_TABLET | Freq: Four times a day (QID) | ORAL | Status: DC | PRN
Start: 2016-08-25 — End: 2016-08-29

## 2016-08-25 MED ORDER — AZTREONAM 2 G IJ SOLR
2.0000 g | Freq: Once | INTRAMUSCULAR | Status: DC
  Filled 2016-08-25: qty 2

## 2016-08-25 MED ORDER — DILTIAZEM HCL 60 MG PO TABS
60.0000 mg | ORAL_TABLET | Freq: Three times a day (TID) | ORAL | Status: DC
  Administered 2016-08-25 – 2016-08-29 (×11): 60 mg via ORAL
  Filled 2016-08-25 (×11): qty 1

## 2016-08-25 MED ORDER — BUDESONIDE 0.5 MG/2ML IN SUSP
0.5000 mg | Freq: Two times a day (BID) | RESPIRATORY_TRACT | Status: DC
Start: 2016-08-25 — End: 2016-08-29
  Administered 2016-08-25 – 2016-08-29 (×8): 0.5 mg via RESPIRATORY_TRACT
  Filled 2016-08-25 (×8): qty 2

## 2016-08-25 MED ORDER — AMIODARONE HCL 200 MG PO TABS
400.0000 mg | ORAL_TABLET | Freq: Once | ORAL | Status: AC
  Administered 2016-08-25: 400 mg via ORAL
  Filled 2016-08-25: qty 2

## 2016-08-25 MED ORDER — ONDANSETRON HCL 4 MG PO TABS: 4.0000 mg | ORAL_TABLET | Freq: Four times a day (QID) | ORAL | Status: DC | PRN

## 2016-08-25 MED ORDER — AZITHROMYCIN 500 MG PO TABS
500.0000 mg | ORAL_TABLET | Freq: Every day | ORAL | Status: AC
  Administered 2016-08-25: 500 mg via ORAL
  Filled 2016-08-25: qty 1

## 2016-08-25 MED ORDER — APIXABAN 2.5 MG PO TABS
2.5000 mg | ORAL_TABLET | Freq: Two times a day (BID) | ORAL | Status: DC
  Administered 2016-08-25 – 2016-08-29 (×8): 2.5 mg via ORAL
  Filled 2016-08-25 (×8): qty 1

## 2016-08-25 MED ORDER — ACETAMINOPHEN 650 MG RE SUPP: 650.0000 mg | Freq: Four times a day (QID) | RECTAL | Status: DC | PRN

## 2016-08-25 MED ORDER — SODIUM CHLORIDE 0.9 % IV SOLN: 250.0000 mL | INTRAVENOUS | Status: DC | PRN

## 2016-08-25 MED ORDER — FUROSEMIDE 40 MG PO TABS
40.0000 mg | ORAL_TABLET | Freq: Every day | ORAL | Status: DC
  Administered 2016-08-25 – 2016-08-27 (×2): 40 mg via ORAL
  Filled 2016-08-25 (×2): qty 1

## 2016-08-25 MED ORDER — POLYETHYLENE GLYCOL 3350 17 G PO PACK
17.0000 g | PACK | Freq: Every day | ORAL | Status: DC | PRN
Start: 2016-08-25 — End: 2016-08-29

## 2016-08-25 MED ORDER — SODIUM CHLORIDE 0.9% FLUSH: 3.0000 mL | INTRAVENOUS | Status: DC | PRN

## 2016-08-25 MED ORDER — POTASSIUM CHLORIDE ER 10 MEQ PO TBCR
10.0000 meq | EXTENDED_RELEASE_TABLET | Freq: Every day | ORAL | Status: DC
  Administered 2016-08-25 – 2016-08-28 (×4): 10 meq via ORAL
  Filled 2016-08-25 (×8): qty 1

## 2016-08-25 MED ORDER — AMIODARONE HCL 200 MG PO TABS
100.0000 mg | ORAL_TABLET | Freq: Every day | ORAL | Status: DC
  Administered 2016-08-26 – 2016-08-29 (×4): 100 mg via ORAL
  Filled 2016-08-25: qty 1
  Filled 2016-08-25: qty 2
  Filled 2016-08-25 (×2): qty 1

## 2016-08-25 MED ORDER — ARFORMOTEROL TARTRATE 15 MCG/2ML IN NEBU
15.0000 ug | INHALATION_SOLUTION | Freq: Two times a day (BID) | RESPIRATORY_TRACT | Status: DC
  Administered 2016-08-25 – 2016-08-29 (×8): 15 ug via RESPIRATORY_TRACT
  Filled 2016-08-25 (×9): qty 2

## 2016-08-25 MED ORDER — DOCUSATE SODIUM 100 MG PO CAPS
100.0000 mg | ORAL_CAPSULE | Freq: Every day | ORAL | Status: DC
  Administered 2016-08-25 – 2016-08-28 (×4): 100 mg via ORAL
  Filled 2016-08-25 (×4): qty 1

## 2016-08-25 MED ORDER — SODIUM CHLORIDE 0.9% FLUSH
3.0000 mL | Freq: Two times a day (BID) | INTRAVENOUS | Status: DC
  Administered 2016-08-25: 3 mL via INTRAVENOUS

## 2016-08-25 MED ORDER — SODIUM CHLORIDE 0.9% FLUSH
3.0000 mL | Freq: Two times a day (BID) | INTRAVENOUS | Status: DC
  Administered 2016-08-25 – 2016-08-29 (×9): 3 mL via INTRAVENOUS

## 2016-08-25 MED ORDER — DEXTROSE 5 % IV SOLN
2.0000 g | Freq: Two times a day (BID) | INTRAVENOUS | Status: DC
  Administered 2016-08-25 – 2016-08-29 (×8): 2 g via INTRAVENOUS
  Filled 2016-08-25 (×12): qty 2

## 2016-08-25 MED ORDER — ALBUTEROL SULFATE (2.5 MG/3ML) 0.083% IN NEBU
2.5000 mg | INHALATION_SOLUTION | RESPIRATORY_TRACT | Status: DC | PRN
  Administered 2016-08-28: 2.5 mg via RESPIRATORY_TRACT
  Filled 2016-08-25: qty 3

## 2016-08-25 MED ORDER — PANTOPRAZOLE SODIUM 40 MG PO TBEC
40.0000 mg | DELAYED_RELEASE_TABLET | Freq: Every day | ORAL | Status: DC
  Administered 2016-08-26 – 2016-08-29 (×4): 40 mg via ORAL
  Filled 2016-08-25 (×4): qty 1

## 2016-08-25 MED ORDER — VANCOMYCIN HCL 500 MG IV SOLR
500.0000 mg | Freq: Once | INTRAVENOUS | Status: DC
  Filled 2016-08-25: qty 500

## 2016-08-25 MED ORDER — ATORVASTATIN CALCIUM 10 MG PO TABS
10.0000 mg | ORAL_TABLET | Freq: Every day | ORAL | Status: DC
  Administered 2016-08-26 – 2016-08-28 (×3): 10 mg via ORAL
  Filled 2016-08-25 (×3): qty 1

## 2016-08-25 MED ORDER — AZITHROMYCIN 250 MG PO TABS
250.0000 mg | ORAL_TABLET | Freq: Every day | ORAL | Status: AC
  Administered 2016-08-26 – 2016-08-29 (×4): 250 mg via ORAL
  Filled 2016-08-25 (×4): qty 1

## 2016-08-25 NOTE — Telephone Encounter (Signed)
Spoke with pt who states she 02 sats have been in the 70s with increased sob, while on the phone pt checked 02 and was at 29. Pt denies any confusion, headache or dizziness. Pt advised to go to the ED but refused. Spoke with VM who states pt needs to come in for an acute visit. Pt scheduled at 10:45 with DK for 08/25/16. Nothing further needed.

## 2016-08-25 NOTE — H&P (Addendum)
[place] [facility] [place] NAME: Lydia [name]    MR#:  [id number]  DATE OF BIRTH:  08-07-1927  DATE OF ADMISSION:  08/25/2016  PRIMARY CARE PHYSICIAN: Marinda Elk, MD   REQUESTING/REFERRING PHYSICIAN: Dr. Robet Leu  CHIEF COMPLAINT:   Chief Complaint  Patient presents with  . Shortness of Breath    HISTORY OF PRESENT ILLNESS:  Lydia [name]  is a 82 y.o. female with a known history of Diastolic CHF, atrial fibrillation, pacemaker, hypertension, CVA presents to the emergency room complaining of worsening shortness of breath over the last 3 days. Patient was seen in the pulmonary clinic by Dr. Mortimer Fries found to have saturations in the 80s on room air and sent to the ER. Here chest x-ray shows multifocal pneumonia and atrial fibrillation with rapid ventricular rate. Patient was last admitted here at [facility] in June and then transferred to [facility] for pacemaker placement. Patient had complications of pneumonia and stroke during this episode. He was sent to [facility] rehabilitation and was discharged home 3 weeks back.  PAST MEDICAL HISTORY:   Past Medical History:  Diagnosis Date  . COPD (chronic obstructive pulmonary disease) ([place])   . Essential hypertension   . History of stomach ulcers    a. s/p surgery ~ 40 yrs ago.  . IDA (iron deficiency anemia)    a. 04/2016 CT Abd: no malignancy.  . Lower back pain   . New onset atrial flutter ([place]) 05/2016  . Osteoporosis   . PAD (peripheral artery disease) ([place])    a. 04/2016 CT Abd: occluded prox LSFA.  . Polycythemia    a. phlebotomy.  . Pulmonary nodule, left    a. 04/2016 CT chest: 52mm LUL nodule - f/u CT in 12 months (h/o smoking).  . Superior mesenteric artery stenosis ([place])    a. 04/2016 CT Abd: atheromatous plaque and mural thrombus in SMA.  . Tachy-brady syndrome ([place]) 05/2016   a. to [facility] for PPM placement  . Thyroid nodule    a. 04/2016  large left thyroid nodule on CT chest  - rec dedicated u/s.  . Tobacco abuse   . Weight loss, unintentional     PAST SURGICAL HISTORY:   Past Surgical History:  Procedure Laterality Date  . BLEPHAROPLASTY    . CATARACT EXTRACTION W/PHACO Right 01/27/2016   Procedure: CATARACT EXTRACTION PHACO AND INTRAOCULAR LENS PLACEMENT (IOC);  Surgeon: Ronnell Freshwater, MD;  Location: [place];  Service: Ophthalmology;  Laterality: Right;  . COLONOSCOPY    . COLONOSCOPY  2005, 2010  . EP IMPLANTABLE DEVICE N/A 06/08/2016   Procedure: Pacemaker Implant;  Surgeon: Evans Lance, MD;  Location: [place] CV LAB;  Service: Cardiovascular;  Laterality: N/A;  . STOMACH SURGERY  1995   for ulcers    SOCIAL HISTORY:   Social History  Substance Use Topics  . Smoking status: Former Smoker    Packs/day: 1.00    Years: 65.00    Types: Cigarettes  . Smokeless tobacco: Never Used     Comment: quit smoking 06/04/16  . Alcohol use No    FAMILY HISTORY:   Family History  Problem Relation Age of Onset  . Breast cancer Sister   . Other      no premature CAD    DRUG ALLERGIES:   Allergies  Allergen Reactions  . Penicillins Hives    Has patient had a PCN reaction causing immediate rash,  facial/tongue/throat swelling, SOB or lightheadedness with hypotension: no Has patient had a PCN reaction causing severe rash involving mucus membranes or skin necrosis: No Has patient had a PCN reaction that required hospitalization No Has patient had a PCN reaction occurring within the last 10 years. No If all of the above answers are "NO", then may proceed with Cephalosporin use.    REVIEW OF SYSTEMS:   Review of Systems  Constitutional: Positive for malaise/fatigue and weight loss. Negative for chills and fever.  HENT: Negative for hearing loss and nosebleeds.   Eyes: Negative for blurred vision, double vision and pain.  Respiratory: Positive for cough, sputum production and shortness  of breath. Negative for hemoptysis and wheezing.   Cardiovascular: Negative for chest pain, palpitations, orthopnea and leg swelling.  Gastrointestinal: Negative for abdominal pain, constipation, diarrhea, nausea and vomiting.  Genitourinary: Negative for dysuria and hematuria.  Musculoskeletal: Negative for back pain, falls and myalgias.  Skin: Negative for rash.  Neurological: Positive for dizziness and weakness. Negative for sensory change, speech change, focal weakness, seizures and headaches.  Endo/Heme/Allergies: Does not bruise/bleed easily.  Psychiatric/Behavioral: Negative for depression and memory loss. The patient has insomnia. The patient is not nervous/anxious.     MEDICATIONS AT HOME:   Prior to Admission medications   Medication Sig Start Date End Date Taking? Authorizing Provider  amiodarone (PACERONE) 200 MG tablet Take 100 mg by mouth daily.    Yes Historical Provider, MD  amLODipine (NORVASC) 5 MG tablet Take 5 mg by mouth daily.    Yes Historical Provider, MD  apixaban (ELIQUIS) 2.5 MG TABS tablet Take 1 tablet (2.5 mg total) by mouth 2 (two) times daily. 06/16/16  Yes Renee Dyane Dustman, PA-C  arformoterol (BROVANA) 15 MCG/2ML NEBU Take 2 mLs (15 mcg total) by nebulization 2 (two) times daily. 06/16/16  Yes Renee Dyane Dustman, PA-C  atorvastatin (LIPITOR) 10 MG tablet Take 1 tablet (10 mg total) by mouth daily at 6 PM. 06/16/16  Yes Renee Dyane Dustman, PA-C  budesonide (PULMICORT) 0.5 MG/2ML nebulizer solution Take 2 mLs (0.5 mg total) by nebulization 2 (two) times daily. 06/16/16  Yes Renee Dyane Dustman, PA-C  docusate sodium (COLACE) 100 MG capsule Take 100 mg by mouth at bedtime.   Yes Historical Provider, MD  furosemide (LASIX) 40 MG tablet Take 40 mg by mouth at bedtime.   Yes Historical Provider, MD  ipratropium-albuterol (DUONEB) 0.5-2.5 (3) MG/3ML SOLN Take 3 mLs by nebulization every 6 (six) hours as needed. 06/16/16  Yes Renee Dyane Dustman, PA-C  omeprazole (PRILOSEC) 20 MG  capsule Take 20 mg by mouth daily. For Gerd 04/30/16  Yes Historical Provider, MD  potassium chloride (K-DUR) 10 MEQ tablet Take 10 mEq by mouth at bedtime. With furosemide.   Yes Historical Provider, MD     VITAL SIGNS:  Blood pressure 125/65, pulse (!) 130, temperature 98.1 F (36.7 C), temperature source Oral, resp. rate (!) 31, height 5\' 6"  (1.676 m), weight 38.6 kg (85 lb), SpO2 (!) 80 %.  PHYSICAL EXAMINATION:  Physical Exam  GENERAL:  82 y.o.-year-old patient lying in the bed with no acute distress.  EYES: Pupils equal, round, reactive to light and accommodation. No scleral icterus. Extraocular muscles intact.  HEENT: Head atraumatic, normocephalic. Oropharynx and nasopharynx clear. No oropharyngeal erythema, moist oral mucosa  NECK:  Supple, no jugular venous distention. No thyroid enlargement, no tenderness.  LUNGS: Normal breath sounds bilaterally, no wheezing, rales, rhonchi. No use of accessory muscles of respiration.  CARDIOVASCULAR: S1,  S2 normal. No murmurs, rubs, or gallops.  ABDOMEN: Soft, nontender, nondistended. Bowel sounds present. No organomegaly or mass.  EXTREMITIES: No pedal edema, cyanosis, or clubbing. + 2 pedal & radial pulses b/l.   NEUROLOGIC: Cranial nerves II through XII are intact. No focal Motor or sensory deficits appreciated b/l PSYCHIATRIC: The patient is alert and oriented x 3. Good affect.  SKIN: No obvious rash, lesion, or ulcer.   LABORATORY PANEL:   CBC  Recent Labs Lab 08/25/16 1155  WBC 10.3  HGB 12.6  HCT 36.3  PLT 315   ------------------------------------------------------------------------------------------------------------------  Chemistries   Recent Labs Lab 08/25/16 1334  NA 135  K 4.7  CL 101  CO2 25  GLUCOSE 97  BUN 29*  CREATININE 0.71  CALCIUM 8.3*  AST 27  ALT 28  ALKPHOS 84  BILITOT 0.6   ------------------------------------------------------------------------------------------------------------------ Cardiac Enzymes  Recent Labs Lab 08/25/16 1155  TROPONINI <0.03  ------------------------------------------------------------------------------------------------------------------ RADIOLOGY:  Dg Chest Portable 1 View  Result Date: 08/25/2016 CLINICAL DATA:  Worsening shortness of breath today which began approximately 3 months ago EXAM: PORTABLE CHEST 1 VIEW COMPARISON:  Chest x-ray of 06/15/2016 and CT chest of 05/07/2016 FINDINGS: The lungs remain very hyperaerated consistent with emphysema. Parenchymal opacity is noted in the right mid lung and at the right lung base, suspicious for foci of pneumonia. There also are somewhat prominent markings in the left upper lung field. No joint effusion is seen. The heart is mildly enlarged. A dual lead permanent pacemaker remains. IMPRESSION: 1. Patchy opacity in the right mid lung and right lung base and possibly in the left upper lung suspicious for multifocal pneumonia. Recommend followup chest x-ray. 2. Emphysema. Electronically Signed   By: Ivar Drape M.D.   On: 08/25/2016 13:31   IMPRESSION AND PLAN:   * Healthcare acquired pneumonia, multifocal with sepsis and acute hypoxic respiratory failure Check lactic acid Start cefepime and azithromycin. Recent MRSA PCR was negative. Nebulizers as needed. Wean oxygen as tolerated.  * Atrial fibrillation with rapid ventricular rate due to sepsis Patient does have a pacemaker in place. We'll give her 1 dose of amiodarone 400 mg and continue her home dose. STAT dose of cardizem now. Will schedule 60mg  TID. Telemetry. Continue Eliquis  * Hypertension Hold Norvasc  * DVT prophylaxis Patient is on Eliquis  All the records are reviewed and case discussed with ED provider. Management plans discussed with the patient, family and they are in agreement.  CODE STATUS: DNR/DNI  Discussed  with patient with husband at bedside regarding [place]. She wishes to not have resuscitated or ventilatory support. Orders entered.  TOTAL TIME TAKING CARE OF THIS PATIENT: 45 minutes.   Hillary Bow R M.D on 08/25/2016 at 2:05 PM  Between 7am to 6pm - Pager - [phone]  After 6pm go to [email or web address] - password [email or web address] [place] [facility]  Office  [phone]  CC: Primary care physician; Marinda Elk, MD  Note: This dictation was prepared with Dragon dictation along with smaller phrase technology. Any transcriptional errors that result from this process are unintentional.

## 2016-08-25 NOTE — ED Notes (Signed)
EDP at bedside  

## 2016-08-25 NOTE — ED Notes (Signed)
Family at bedside. 

## 2016-08-25 NOTE — Progress Notes (Signed)
[place] [facility] Consultation      MRN# [id number] Lydia [name] 03-28-1927 Referring Physician - Dr. Bridgett Larsson (hospitalist) PMD - Dr. Carrie Mew  CC: Chief Complaint  Patient presents with  . Acute Visit  for hypoxia, SOB  HPI Patient Seen today because patient noted to have oxygen sats in the 60's, patient arrived to office and when pulse ox probe placed on fingers, patient had o2 sats 60%, but when probe placed on forehead, the o2 sat was 96%  Patient is alert and awake following commands, +SOB and increased WOB Patient noted to have elevated HR in 170's  Patient is NOT cyanotic, fingers are warm to touch      Previous History  82 yo with Hx of COPD, Afib, tachy-brady syndrome seen as hospital follow up for COPD. Hospitalization admission 06/08/16-06/16/16 with tachy-brady syndrome. She had a PPM placed on 06/08/16.Post surgery she had acute respiratory failure and required mechanical ventilation. She was treated with antibiotics for aspiration pneumonia. She also had acute right mid brain infarct and was seen by neurology. She was seen by cardiology service for her afib. She has worked with worked well with PT/OT at [facility] facility since her discharge, she is going to be discharge from rehab facility tomorrow, 07/08/16 to home with PT/OT. Today with complaints with weakness, and fatigue Denies sob, wheezing, fever and chills. Former smoker, quit on 06/04/16.  Previously smoked 1ppd x 68 years.  Diagnosed with COPD by PMD in 2007, but continue to smoke until recently. Currently on dunoeb prn, Brovana BID, Pulmicort BID. patient has a stage II sacral decubitus ulcer for which home health nursing will follow her for    Review of Systems  Constitutional: Positive for malaise/fatigue and weight loss. Negative for chills and fever.  HENT: Positive for congestion.   Respiratory: Positive for shortness of breath. Negative for cough, hemoptysis, sputum production and wheezing.     Cardiovascular: Negative for chest pain and palpitations.  Gastrointestinal: Negative for abdominal pain, nausea and vomiting.  Genitourinary: Negative for dysuria.  Musculoskeletal: Positive for joint pain.  Skin: Negative for rash.  Neurological: Positive for weakness. Negative for dizziness and headaches.  Endo/Heme/Allergies: Does not bruise/bleed easily.  Psychiatric/Behavioral: Negative for depression. Nervous/anxious: .vs.       Allergies:  Penicillins  Physical Examination:  VS:BP 106/64 (BP Location: Left Arm, Cuff Size: Normal)   Pulse 74   Ht 5\' 8"  (1.727 m)   Wt 85 lb (38.6 kg) Comment: weight is per pt.  SpO2 96%   BMI 12.92 kg/m    HR went into 170's on physical exam General Appearance: No wheelchair, moderate cachexia appearance HEENT: PERRLA, no ptosis, no other lesions noticed Pulmonary:normal breath sounds., diaphragmatic excursion normal.No wheezing, No rales   Cardiovascular:  Normal S1,S2.  No m/r/g.     Abdomen:Exam: Benign, Soft, non-tender, No masses  Skin:   warm, no rashes, no ecchymosis  Extremities: normal, no cyanosis, clubbing, warm with normal capillary refill.      Rad results: (The following images and results were reviewed by Dr. Stevenson Clinch on 08/25/2016). CT Chest 04/2016  IMPRESSION: 1.Severe emphysema. 2.5 mm nodule in the as ago esophageal recess and 5 mm nodule in the left upper lobe. No follow-up needed if patient is low-risk (and has no known or suspected primary neoplasm). Non-contrast chest CT can be considered in 12 months if patient is high-risk. This recommendation follows the consensus statement: Guidelines for Management of Incidental Pulmonary Nodules Detected on CT Images:From the  Fleischner Society 2017; published online before print (10.1148/radiol.IJ:2314499). 3. Peripheral bronchiectasis and airway plugging in the lingula.     Assessment and Plan: 82 year old female seen today for acute care visit Now with acute  hypoxia with increased WOB and SOB with likely acute afib with RVR and acute CHF/COPD exacerbation  I have assessed patient and discussed that she will need to got to ER for further assessment and management,  patient agrees with that plan.  I have also talked with her regarding CODE STATUS and she has stated that she does not want intubation and no chest compressions. Patient is DNR/DNI  Prognosis is very poor, I have also discussed about hospice referral and she agrees with that. I have talked with Marya Amsler the charge nurse in ER today and updated them about her arrival.  Husband of patient at bedside and agrees with plan of action and management    The Patient requires high complexity decision making for assessment and support, frequent evaluation and titration of therapies. Patient/Family are satisfied with Plan of action and management. All questions answered  [name], M.D.  [facility]  Medical Director [place] Director [facility] Cardio-Pulmonary Department

## 2016-08-25 NOTE — ED Provider Notes (Signed)
[facility] Emergency Department Provider Note  ____________________________________________   None    (approximate)  I have reviewed the triage vital signs and the nursing notes.   HISTORY  Chief Complaint Shortness of Breath  HPI Lydia [name] is a 82 y.o. female w a h/o HTN, Afib on Eliquis, COPD, SSS s/p pacemaker placed 06/08/16 who presents with hypoxia and shortness of breath.  She has been home for 3 weeks after being in rehabilitation for approximately 3 weeks following a hospitalization during which she was intubated for respiratory failure; she also had a pacemaker placed due to sick sinus syndrome. She was thought to have had a stroke during the admission as well. Since being home she has been feeling generally weak but it has gotten worse in the past few days. Her husband notes that her lower extremity edema has improved somewhat since her time in rehabilitation; they have been giving her Lasix. He decreased her amiodarone to half a tab due to constipation. She denies any fevers or chills, cough, headache, chest pain, abdominal pain.  Past Medical History:  Diagnosis Date  . COPD (chronic obstructive pulmonary disease) ([place])   . Essential hypertension   . History of stomach ulcers    a. s/p surgery ~ 40 yrs ago.  . IDA (iron deficiency anemia)    a. 04/2016 CT Abd: no malignancy.  . Lower back pain   . New onset atrial flutter ([place]) 05/2016  . Osteoporosis   . PAD (peripheral artery disease) ([place])    a. 04/2016 CT Abd: occluded prox LSFA.  . Polycythemia    a. phlebotomy.  . Pulmonary nodule, left    a. 04/2016 CT chest: 7mm LUL nodule - f/u CT in 12 months (h/o smoking).  . Superior mesenteric artery stenosis ([place])    a. 04/2016 CT Abd: atheromatous plaque and mural thrombus in SMA.  . Tachy-brady syndrome ([place]) 05/2016   a. to [facility] for PPM placement  . Thyroid nodule    a. 04/2016 large left thyroid nodule on CT chest  - rec dedicated u/s.  .  Tobacco abuse   . Weight loss, unintentional     Patient Active Problem List   Diagnosis Date Noted  . Pneumonia 08/25/2016  . Emphysema lung ([place]) 07/08/2016  . Tobacco abuse 07/08/2016  . Cachexia ([place]) 07/08/2016  . Protein-calorie malnutrition ([place]) 06/17/2016  . Tachy-brady syndrome ([place]) 06/17/2016  . Acute respiratory failure ([place])   . Essential hypertension 06/14/2016  . Unintentional weight loss 06/14/2016  . Paroxysmal atrial fibrillation (H[facility])   . Pressure ulcer 06/09/2016  . Acute hypoxemic respiratory failure ([place])   . Sick sinus syndrome ([place]) 06/08/2016  . Typical atrial flutter ([place])   . Sinus pause   . Chronic bronchitis ([place])   . Smoker unmotivated to quit   . Cerebrovascular accident (CVA) due to embolism of cerebral artery ([place])   . Tachycardia-bradycardia syndrome ([place]) 06/06/2016  . New onset atrial fibrillation ([place]) 06/05/2016  . Protein-calorie malnutrition, severe 06/05/2016  . Degeneration of intervertebral disc of lumbar region 04/01/2015  . H/O adenomatous polyp of colon 10/03/2014  . Neuritis or radiculitis due to rupture of lumbar intervertebral disc 07/11/2014  . Lumbar canal stenosis 07/11/2014  . Chronic obstructive pulmonary disease ([place]) 05/25/2014  . Blood glucose elevated 05/25/2014  . HLD (hyperlipidemia) 05/25/2014  . Anemia, iron deficiency 05/25/2014  . Osteoporosis, post-menopausal 05/25/2014  . Acquired polycythemia 05/25/2014    Past Surgical History:  Procedure Laterality Date  .  BLEPHAROPLASTY    . CATARACT EXTRACTION W/PHACO Right 01/27/2016   Procedure: CATARACT EXTRACTION PHACO AND INTRAOCULAR LENS PLACEMENT (IOC);  Surgeon: Ronnell Freshwater, MD;  Location: [facility];  Service: Ophthalmology;  Laterality: Right;  . COLONOSCOPY    . COLONOSCOPY  2005, 2010  . EP IMPLANTABLE DEVICE N/A 06/08/2016   Procedure: Pacemaker Implant;  Surgeon: Evans Lance, MD;  Location: [facility];  Service:  Cardiovascular;  Laterality: N/A;  . STOMACH SURGERY  1995   for ulcers    Prior to Admission medications   Medication Sig Start Date End Date Taking? Authorizing Provider  amiodarone (PACERONE) 200 MG tablet Take 100 mg by mouth daily.    Yes Historical Provider, MD  amLODipine (NORVASC) 5 MG tablet Take 5 mg by mouth daily.    Yes Historical Provider, MD  apixaban (ELIQUIS) 2.5 MG TABS tablet Take 1 tablet (2.5 mg total) by mouth 2 (two) times daily. 06/16/16  Yes Renee Dyane Dustman, PA-C  arformoterol (BROVANA) 15 MCG/2ML NEBU Take 2 mLs (15 mcg total) by nebulization 2 (two) times daily. 06/16/16  Yes Renee Dyane Dustman, PA-C  atorvastatin (LIPITOR) 10 MG tablet Take 1 tablet (10 mg total) by mouth daily at 6 PM. 06/16/16  Yes Renee Dyane Dustman, PA-C  budesonide (PULMICORT) 0.5 MG/2ML nebulizer solution Take 2 mLs (0.5 mg total) by nebulization 2 (two) times daily. 06/16/16  Yes Renee Dyane Dustman, PA-C  docusate sodium (COLACE) 100 MG capsule Take 100 mg by mouth at bedtime.   Yes Historical Provider, MD  furosemide (LASIX) 40 MG tablet Take 40 mg by mouth at bedtime.   Yes Historical Provider, MD  ipratropium-albuterol (DUONEB) 0.5-2.5 (3) MG/3ML SOLN Take 3 mLs by nebulization every 6 (six) hours as needed. 06/16/16  Yes Renee Dyane Dustman, PA-C  omeprazole (PRILOSEC) 20 MG capsule Take 20 mg by mouth daily. For Gerd 04/30/16  Yes Historical Provider, MD  potassium chloride (K-DUR) 10 MEQ tablet Take 10 mEq by mouth at bedtime. With furosemide.   Yes Historical Provider, MD    Allergies Penicillins  Family History  Problem Relation Age of Onset  . Breast cancer Sister   . Other      no premature CAD    Social History Social History  Substance Use Topics  . Smoking status: Former Smoker    Packs/day: 1.00    Years: 65.00    Types: Cigarettes  . Smokeless tobacco: Never Used     Comment: quit smoking 06/04/16  . Alcohol use No    Review of Systems Constitutional: No fever/chills; 50# wt  loss since hospitalization Eyes: No visual changes. ENT: No sore throat. Cardiovascular: Denies chest pain. Respiratory: see hpi. Gastrointestinal: No abdominal pain.  No nausea, no vomiting.  No diarrhea.  No constipation. Genitourinary: Negative for dysuria. Musculoskeletal: Negative for back pain. Skin: Negative for rash. Neurological: Negative for headaches, focal weakness or numbness.  10-point ROS otherwise negative.  ____________________________________________   PHYSICAL EXAM:  VITAL SIGNS: ED Triage Vitals  Enc Vitals Group     BP 08/25/16 1133 108/80     Pulse Rate 08/25/16 1133 (!) 130     Resp 08/25/16 1133 20     Temp 08/25/16 1133 98.1 F (36.7 C)     Temp Source 08/25/16 1133 Oral     SpO2 08/25/16 1133 (!) 80 %     Weight 08/25/16 1134 85 lb (38.6 kg)     Height 08/25/16 1134 5\' 6"  (1.676  m)     Head Circumference --      Peak Flow --      Pain Score --      Pain Loc --      Pain Edu? --      Excl. in [place]? --    Constitutional: Alert and oriented.no acute distress; appears to feel unwell. Emaciated Eyes: Conjunctivae are normal. PERRL. EOMI. Head: Atraumatic. Nose: No congestion/rhinnorhea. Mouth/Throat: Mucous membranes are moist.  Oropharynx non-erythematous. Neck: No stridor.   Hematological/Lymphatic/Immunilogical: No cervical lymphadenopathy. Cardiovascular:irregularly irregular rhythm. Grossly normal heart sounds.  Good peripheral circulation. Respiratory: mild tachypnea; rhonchi RML/ RLL Gastrointestinal: Soft and nontender. No distention. No abdominal bruits. No CVA tenderness. Musculoskeletal: 1-2+ pitting edema B feet; 1+ B LE Neurologic:  Normal speech and language. No gross focal neurologic deficits are appreciated. Skin:  Skin is warm, dry and intact. No rash noted. Psychiatric: Mood and affect are normal. Speech and behavior are normal.  ____________________________________________   LABS (all labs ordered are listed, but only  abnormal results are displayed)  Labs Reviewed  CBC WITH DIFFERENTIAL/PLATELET - Abnormal; Notable for the following:       Result Value   RDW 15.5 (*)    Neutro Abs 9.1 (*)    Lymphs Abs 0.3 (*)    All other components within normal limits  COMPREHENSIVE METABOLIC PANEL - Abnormal; Notable for the following:    BUN 29 (*)    Calcium 8.3 (*)    Total Protein 6.2 (*)    Albumin 2.6 (*)    All other components within normal limits  CULTURE, BLOOD (ROUTINE X 2)  CULTURE, BLOOD (ROUTINE X 2)  CULTURE, EXPECTORATED SPUTUM-ASSESSMENT  MRSA PCR SCREENING  TROPONIN I  BRAIN NATRIURETIC PEPTIDE   ____________________________________________  EKG  ED ECG REPORT I, Ponciano Ort, the attending physician, personally viewed and interpreted this ECG.   Date: 08/25/2016  EKG Time:1145  Rate:124  Rhythm:afib  Axis:nl  Intervals:nl  ST&T Change:none  ____________________________________________  RADIOLOGY  CXR-IMPRESSION: 1. Patchy opacity in the right mid lung and right lung base and possibly in the left upper lung suspicious for multifocal pneumonia. Recommend followup chest x-ray. 2. Emphysema.  ____________________________________________    Procedures-n/a  ____________________________________________   INITIAL IMPRESSION / ASSESSMENT AND PLAN / ED COURSE  Pertinent labs & imaging results that were available during my care of the patient were reviewed by me and considered in my medical decision making (see chart for details).   Clinical Course   ----------------------------------------- 1:19 PM on 08/25/2016 -----------------------------------------  Radiology had been backed up and portable chest x-ray was just completed. Upon my viewing of the film in the room it appears that patient has a right middle lobe pneumonia. As she was just discharged from rehabilitation 3 weeks ago, I will cover her for hospital-acquired pneumonia with aztreonam and vancomycin  since penicillin causes hives.  Pt updated on plan of care.  Dr. Darvin Neighbours, prime doc, will admit.  ____________________________________________   FINAL CLINICAL IMPRESSION(S) / ED DIAGNOSES  Final diagnoses:  HCAP (healthcare-associated pneumonia)      NEW MEDICATIONS STARTED DURING THIS VISIT:  New Prescriptions   No medications on file     Note:  This document was prepared using Dragon voice recognition software and may include unintentional dictation errors.    Ponciano Ort, MD 08/25/16 [id number]

## 2016-08-25 NOTE — Patient Instructions (Signed)
Acute visit To be transferred to ER for further work up and management

## 2016-08-25 NOTE — ED Triage Notes (Signed)
Pt presents from home with sob, worsening in last 3 days. Pt at 80% O2 sat during triage; moved to room to complete ekg and get on O2. Pt in NAD, had pacemaker placed in June.

## 2016-08-25 NOTE — Progress Notes (Signed)
Pharmacy Antibiotic Note  Lydia [name] is a 82 y.o. female admitted on 08/25/2016 with pneumonia.  Pharmacy has been consulted for cefepime dosing.  Plan: Cefepime 2gm IV Q12H (renal dose adjustment for cefepime 2gm IV Q8H)  Height: 5\' 6"  (167.6 cm) Weight: 85 lb (38.6 kg) IBW/kg (Calculated) : 59.3  Temp (24hrs), Avg:98.1 F (36.7 [name]), Min:98.1 F (36.7 [name]), Max:98.1 F (36.7 [name])   Recent Labs Lab 08/25/16 1155 08/25/16 1334  WBC 10.3  --   CREATININE  --  0.71    Estimated Creatinine Clearance: 30.8 mL/min (by [name]-G formula based on SCr of 0.8 mg/dL).    Allergies  Allergen Reactions  . Penicillins Hives    Has patient had a PCN reaction causing immediate rash, facial/tongue/throat swelling, SOB or lightheadedness with hypotension: no Has patient had a PCN reaction causing severe rash involving mucus membranes or skin necrosis: No Has patient had a PCN reaction that required hospitalization No Has patient had a PCN reaction occurring within the last 10 years. No If all of the above answers are "NO", then may proceed with Cephalosporin use.    Antimicrobials this admission: 8/29 cefepime >>  8/29 azithromycin >>   Dose adjustments this admission:   Microbiology results: 8/29 BCx:  8/29 Sputum:   8/29 MRSA PCR:   Thank you for allowing pharmacy to be a part of this patient's care.  Lydia [name] 08/25/2016 3:43 PM

## 2016-08-26 ENCOUNTER — Inpatient Hospital Stay: Payer: Medicare HMO

## 2016-08-26 LAB — CBC
HCT: 33.8 % — ABNORMAL LOW (ref 35.0–47.0)
HEMOGLOBIN: 11.6 g/dL — AB (ref 12.0–16.0)
MCH: 31 pg (ref 26.0–34.0)
MCHC: 34.4 g/dL (ref 32.0–36.0)
MCV: 90 fL (ref 80.0–100.0)
Platelets: 294 10*3/uL (ref 150–440)
RBC: 3.75 MIL/uL — AB (ref 3.80–5.20)
RDW: 14.9 % — ABNORMAL HIGH (ref 11.5–14.5)
WBC: 7.6 10*3/uL (ref 3.6–11.0)

## 2016-08-26 LAB — BASIC METABOLIC PANEL
ANION GAP: 7 (ref 5–15)
BUN: 31 mg/dL — ABNORMAL HIGH (ref 6–20)
CHLORIDE: 102 mmol/L (ref 101–111)
CO2: 29 mmol/L (ref 22–32)
Calcium: 8 mg/dL — ABNORMAL LOW (ref 8.9–10.3)
Creatinine, Ser: 0.79 mg/dL (ref 0.44–1.00)
GFR calc non Af Amer: >60 mL/min (ref >60)
Glucose, Bld: 100 mg/dL — ABNORMAL HIGH (ref 65–99)
Potassium: 4.2 mmol/L (ref 3.5–5.1)
Sodium: 138 mmol/L (ref 135–145)

## 2016-08-26 MED ORDER — ENSURE ENLIVE PO LIQD
237.0000 mL | Freq: Two times a day (BID) | ORAL | Status: DC
  Administered 2016-08-26 – 2016-08-29 (×6): 237 mL via ORAL

## 2016-08-26 NOTE — Progress Notes (Signed)
[place] [facility] [place] NAME: Lydia [name]    MR#:  [id number]  DATE OF BIRTH:  [birth year]  SUBJECTIVE:  CHIEF COMPLAINT:  Patient's shortness of breath is better. Reporting cough  REVIEW OF SYSTEMS:  CONSTITUTIONAL: No fever, fatigue. Reporting weakness.  EYES: No blurred or double vision.  EARS, NOSE, AND THROAT: No tinnitus or ear pain.  RESPIRATORY: Reporting cough,  and exertional  shortness of breath,  denieswheezing or hemoptysis.  CARDIOVASCULAR: No chest pain, orthopnea, edema.  GASTROINTESTINAL: No nausea, vomiting, diarrhea or abdominal pain.  GENITOURINARY: No dysuria, hematuria.  ENDOCRINE: No polyuria, nocturia,  HEMATOLOGY: No anemia, easy bruising or bleeding SKIN: No rash or lesion. MUSCULOSKELETAL: No joint pain or arthritis.   NEUROLOGIC: No tingling, numbness, weakness.  PSYCHIATRY: No anxiety or depression.   DRUG ALLERGIES:   Allergies  Allergen Reactions  . Penicillins Hives    Has patient had a PCN reaction causing immediate rash, facial/tongue/throat swelling, SOB or lightheadedness with hypotension: no Has patient had a PCN reaction causing severe rash involving mucus membranes or skin necrosis: No Has patient had a PCN reaction that required hospitalization No Has patient had a PCN reaction occurring within the last 10 years. No If all of the above answers are "NO", then may proceed with Cephalosporin use.    VITALS:  Blood pressure (!) 121/54, pulse 60, temperature 97.6 F (36.4 C), temperature source Oral, resp. rate 20, height 5\' 6"  (1.676 m), weight 39.8 kg (87 lb 11.2 oz), SpO2 97 %.  PHYSICAL EXAMINATION:  GENERAL:  82 y.o.-year-old patient lying in the bed with no acute distress.  EYES: Pupils equal, round, reactive to light and accommodation. No scleral icterus. Extraocular muscles intact.  HEENT: Head atraumatic, normocephalic. Oropharynx and nasopharynx clear.  NECK:  Supple, no jugular  venous distention. No thyroid enlargement, no tenderness.  LUNGS:  Moderate breath sounds bilaterally, no wheezing, rales,rhonchi or crepitation. No use of accessory muscles of respiration.  CARDIOVASCULAR: S1, S2 normal. No murmurs, rubs, or gallops.  ABDOMEN: Soft, nontender, nondistended. Bowel sounds present. No organomegaly or mass.  EXTREMITIES: No pedal edema, cyanosis, or clubbing.  NEUROLOGIC: Cranial nerves II through XII are intact. Muscle strength 5/5 in all extremities. Sensation intact. Gait not checked.  PSYCHIATRIC: The patient is alert and oriented x 3.  SKIN: No obvious rash, lesion, or ulcer.    LABORATORY PANEL:   CBC  Recent Labs Lab 08/26/16 0335  WBC 7.6  HGB 11.6*  HCT 33.8*  PLT 294   ------------------------------------------------------------------------------------------------------------------  Chemistries   Recent Labs Lab 08/25/16 1334 08/26/16 0335  NA 135 138  K 4.7 4.2  CL 101 102  CO2 25 29  GLUCOSE 97 100*  BUN 29* 31*  CREATININE 0.71 0.79  CALCIUM 8.3* 8.0*  AST 27  --   ALT 28  --   ALKPHOS 84  --   BILITOT 0.6  --    ------------------------------------------------------------------------------------------------------------------  Cardiac Enzymes  Recent Labs Lab 08/25/16 1155  TROPONINI <0.03   ------------------------------------------------------------------------------------------------------------------  RADIOLOGY:  US Venous Img Lower Unilateral Left  Result Date: 08/26/2016 CLINICAL DATA:  Left lower extremity pain and swelling for the past 2 weeks. Evaluate for DVT. EXAM: LEFT LOWER EXTREMITY VENOUS DOPPLER ULTRASOUND TECHNIQUE: Gray-scale sonography with graded compression, as well as color Doppler and duplex ultrasound were performed to evaluate the lower extremity deep venous systems from the level of the common femoral vein and including the common femoral, femoral, profunda femoral,  popliteal and calf veins  including the posterior tibial, peroneal and gastrocnemius veins when visible. The superficial great saphenous vein was also interrogated. Spectral Doppler was utilized to evaluate flow at rest and with distal augmentation maneuvers in the common femoral, femoral and popliteal veins. COMPARISON:  None. FINDINGS: Contralateral Common Femoral Vein: Respiratory phasicity is normal and symmetric with the symptomatic side. No evidence of thrombus. Normal compressibility. Common Femoral Vein: No evidence of thrombus. Normal compressibility, respiratory phasicity and response to augmentation. Saphenofemoral Junction: No evidence of thrombus. Normal compressibility and flow on color Doppler imaging. Profunda Femoral Vein: No evidence of thrombus. Normal compressibility and flow on color Doppler imaging. Femoral Vein: No evidence of thrombus. Normal compressibility, respiratory phasicity and response to augmentation. Popliteal Vein: No evidence of thrombus. Normal compressibility, respiratory phasicity and response to augmentation. Calf Veins: No evidence of thrombus. Normal compressibility and flow on color Doppler imaging. Superficial Great Saphenous Vein: No evidence of thrombus. Normal compressibility and flow on color Doppler imaging. Venous Reflux:  None. Other Findings:  None. IMPRESSION: No evidence of DVT within the left lower extremity. Electronically Signed   By: Sandi Mariscal M.D.   On: 08/26/2016 09:27   Dg Chest Portable 1 View  Result Date: 08/25/2016 CLINICAL DATA:  Worsening shortness of breath today which began approximately 3 months ago EXAM: PORTABLE CHEST 1 VIEW COMPARISON:  Chest x-ray of 06/15/2016 and CT chest of 05/07/2016 FINDINGS: The lungs remain very hyperaerated consistent with emphysema. Parenchymal opacity is noted in the right mid lung and at the right lung base, suspicious for foci of pneumonia. There also are somewhat prominent markings in the left upper lung field. No joint effusion is  seen. The heart is mildly enlarged. A dual lead permanent pacemaker remains. IMPRESSION: 1. Patchy opacity in the right mid lung and right lung base and possibly in the left upper lung suspicious for multifocal pneumonia. Recommend followup chest x-ray. 2. Emphysema. Electronically Signed   By: Ivar Drape M.D.   On: 08/25/2016 13:31    EKG:   Orders placed or performed in visit on 08/25/16  . EKG 12-Lead    ASSESSMENT AND PLAN:   * Healthcare acquired pneumonia, multifocal with sepsis and acute hypoxic respiratory failure Clinically improving Continue cefepime and azithromycin. Recent MRSA PCR was negative. Nebulizers as needed. Wean oxygen as tolerated.  * Atrial fibrillation with rapid ventricular rate due to sepsis Patient does have a pacemaker in place. Received 1 dose of amiodarone 400 mg and continue her home dose. Will schedule 60mg  TID. Telemetry. Continue Eliquis  * Hypertension Hold Norvasc for now and consider resuming in a.m. if blood pressure is stable   * DVT prophylaxis Patient is on Eliquis     All the records are reviewed and case discussed with Care Management/Social Workerr. Management plans discussed with the patient, family and they are in agreement.  CODE STATUS: dnr  TOTAL TIME TAKING CARE OF THIS PATIENT: 34 minutes.   POSSIBLE D/C IN 1-2 DAYS, DEPENDING ON CLINICAL CONDITION.  Note: This dictation was prepared with Dragon dictation along with smaller phrase technology. Any transcriptional errors that result from this process are unintentional.   Nicholes Mango M.D on 08/26/2016 at 4:15 PM  Between 7am to 6pm - Pager - [phone] After 6pm go to [email or web address] - password [email or web address] [facility]  [facility]  Office  [phone]  CC: Primary care physician; Marinda Elk, MD

## 2016-08-26 NOTE — Care Management (Signed)
Patient has was discharged from [facility] approximately 3 weeks ago and was receiving home health services at home but not currently.  She had been transferred from [facility]  June 2017 to [facility] for pacemaker and had a cva post pacemaker insertion.  It appears she also required intubation. found that patient ws followed by [facility] a that patient is still currently open to SN PT OT.  Notified [facility] of admission.  Patient's 02 requirements are acute.  She will need to be assessed for need of home oxygen.

## 2016-08-26 NOTE — Progress Notes (Signed)
Initial Nutrition Assessment  DOCUMENTATION CODES:   Severe malnutrition in context of chronic illness, Underweight  INTERVENTION:  -Pt likes Magic Cup, will send on Lunch and [employer], offer in between as snack as well -Recommend also ordering Ensure Enlive po BID, each supplement provides 350 kcal and 20 grams of protein. Pt drinks Ensure/Boost but does not really like -Agree with regular diet, pt would likely benefit from smaller, more frequent meals. Encourage addition of gravies, sauces, syrups, butters, etc to foods to increase kcals without adding extra volume of food. Encourage high kcals/high protein snacks such as nuts/nut butters  NUTRITION DIAGNOSIS:   Malnutrition related to chronic illness as evidenced by severe depletion of muscle mass, severe depletion of body fat.  GOAL:   Patient will meet greater than or equal to 90% of their needs  MONITOR:   PO intake, Supplement acceptance, Weight trends, Labs  REASON FOR ASSESSMENT:   Malnutrition Screening Tool    ASSESSMENT:    82 yo female admitted with acute respiratory failure with sepsis due to pneumonia, afib with RVR  Pt reports no appetite, despite this pt reports she eats 3 meals per day, snacks, shakes. Pt very vague about what a meal might consist, pt reports she might eat a sandwich (when asked if she eats the whole thing, pt states depends on the size of the sandwich). Pt reports sometimes she drinks milkshakes made with ice cream, some days she will take a Boost/Ensure with stuff mixed in, etc. Pt ate yogurt, and 1/4 bagel this AM. Pt reports 50 pound wt loss over the past 2-3 years, weight has been stable recently but unable to gain weight. 36.5% wt loss in 2-3 years; pt is UNDERWEIGHT. Pt reports no problems chewing or swallowing; no issues with N/V/D  Nutrition-Focused physical exam completed. Findings are severe fat depletion, severe muscle depletion, and mild edema in LE (edema improved with diuretics  per patient).    Past Medical History:  Diagnosis Date  . COPD (chronic obstructive pulmonary disease) ([place])   . Essential hypertension   . History of stomach ulcers    a. s/p surgery ~ 40 yrs ago.  . IDA (iron deficiency anemia)    a. 04/2016 CT Abd: no malignancy.  . Lower back pain   . New onset atrial flutter ([place]) 05/2016  . Osteoporosis   . PAD (peripheral artery disease) ([place])    a. 04/2016 CT Abd: occluded prox LSFA.  . Polycythemia    a. phlebotomy.  . Pulmonary nodule, left    a. 04/2016 CT chest: 36mm LUL nodule - f/u CT in 12 months (h/o smoking).  . Superior mesenteric artery stenosis ([place])    a. 04/2016 CT Abd: atheromatous plaque and mural thrombus in SMA.  . Tachy-brady syndrome ([place]) 05/2016   a. to [facility] for PPM placement  . Thyroid nodule    a. 04/2016 large left thyroid nodule on CT chest  - rec dedicated u/s.  . Tobacco abuse   . Weight loss, unintentional      Diet Order:  Diet regular Room service appropriate? Yes; Fluid consistency: Thin  Skin:  Reviewed, no issues  Last BM:  8/29   Labs: reviewed  Meds: lasix  Height:   Ht Readings from Last 1 Encounters:  08/25/16 5\' 6"  (1.676 m)    Weight:   Wt Readings from Last 1 Encounters:  08/26/16 87 lb 11.2 oz (39.8 kg)    Ideal Body Weight:  59 kg  BMI:  Body mass index is 14.16 kg/m.  Estimated Nutritional Needs:   Kcal:  >/= 1400 kcals  Protein:  >/= 60 g  Fluid:  >/= 1.4 L  EDUCATION NEEDS:   No education needs identified at this time  [place], [place], [place] [phone] Pager  [phone] Weekend/On-Call Pager

## 2016-08-27 NOTE — Progress Notes (Signed)
[name] NAME: Lydia [name]    MR#:  [id number]  DATE OF BIRTH:  12-Jul-1927  SUBJECTIVE:  CHIEF COMPLAINT:  Patient's shortness of breath is better. Reporting Generalized weakness, cough is somewhat better  REVIEW OF SYSTEMS:  CONSTITUTIONAL: No fever, fatigue. Reporting weakness.  EYES: No blurred or double vision.  EARS, NOSE, AND THROAT: No tinnitus or ear pain.  RESPIRATORY: Reporting coughStill,  and exertional  shortness of breath,  denieswheezing or hemoptysis.  CARDIOVASCULAR: No chest pain, orthopnea, edema.  GASTROINTESTINAL: No nausea, vomiting, diarrhea or abdominal pain.  GENITOURINARY: No dysuria, hematuria.  ENDOCRINE: No polyuria, nocturia,  HEMATOLOGY: No anemia, easy bruising or bleeding SKIN: No rash or lesion. MUSCULOSKELETAL: No joint pain or arthritis.   NEUROLOGIC: No tingling, numbness, weakness.  PSYCHIATRY: No anxiety or depression.   DRUG ALLERGIES:   Allergies  Allergen Reactions  . Penicillins Hives    Has patient had a PCN reaction causing immediate rash, facial/tongue/throat swelling, SOB or lightheadedness with hypotension: no Has patient had a PCN reaction causing severe rash involving mucus membranes or skin necrosis: No Has patient had a PCN reaction that required hospitalization No Has patient had a PCN reaction occurring within the last 10 years. No If all of the above answers are "NO", then may proceed with Cephalosporin use.    VITALS:  Blood pressure (!) 95/50, pulse 82, temperature 97.7 F (36.5 C), temperature source Oral, resp. rate 14, height 5\' 6"  (1.676 m), weight 39.8 kg (87 lb 11.2 oz), SpO2 93 %.  PHYSICAL EXAMINATION:  GENERAL:  82 y.o.-year-old patient lying in the bed with no acute distress.  EYES: Pupils equal, round, reactive to light and accommodation. No scleral icterus. Extraocular muscles intact.  HEENT: Head atraumatic, normocephalic. Oropharynx and  nasopharynx clear.  NECK:  Supple, no jugular venous distention. No thyroid enlargement, no tenderness.  LUNGS:  Moderate breath sounds bilaterally, no wheezing, rales,rhonchi or crepitation. No use of accessory muscles of respiration.  CARDIOVASCULAR: S1, S2 normal. No murmurs, rubs, or gallops.  ABDOMEN: Soft, nontender, nondistended. Bowel sounds present. No organomegaly or mass.  EXTREMITIES: No pedal edema, cyanosis, or clubbing.  NEUROLOGIC: Cranial nerves II through XII are intact. Muscle strength 5/5 in all extremities. Sensation intact. Gait not checked.  PSYCHIATRIC: The patient is alert and oriented x 3.  SKIN: No obvious rash, lesion, or ulcer.    LABORATORY PANEL:   CBC  Recent Labs Lab 08/26/16 0335  WBC 7.6  HGB 11.6*  HCT 33.8*  PLT 294   ------------------------------------------------------------------------------------------------------------------  Chemistries   Recent Labs Lab 08/25/16 1334 08/26/16 0335  NA 135 138  K 4.7 4.2  CL 101 102  CO2 25 29  GLUCOSE 97 100*  BUN 29* 31*  CREATININE 0.71 0.79  CALCIUM 8.3* 8.0*  AST 27  --   ALT 28  --   ALKPHOS 84  --   BILITOT 0.6  --    ------------------------------------------------------------------------------------------------------------------  Cardiac Enzymes  Recent Labs Lab 08/25/16 1155  TROPONINI <0.03   ------------------------------------------------------------------------------------------------------------------  RADIOLOGY:  US Venous Img Lower Unilateral Left  Result Date: 08/26/2016 CLINICAL DATA:  Left lower extremity pain and swelling for the past 2 weeks. Evaluate for DVT. EXAM: LEFT LOWER EXTREMITY VENOUS DOPPLER ULTRASOUND TECHNIQUE: Gray-scale sonography with graded compression, as well as color Doppler and duplex ultrasound were performed to evaluate the lower extremity deep venous systems from the level of the common femoral vein and including the  common femoral,  femoral, profunda femoral, popliteal and calf veins including the posterior tibial, peroneal and gastrocnemius veins when visible. The superficial great saphenous vein was also interrogated. Spectral Doppler was utilized to evaluate flow at rest and with distal augmentation maneuvers in the common femoral, femoral and popliteal veins. COMPARISON:  None. FINDINGS: Contralateral Common Femoral Vein: Respiratory phasicity is normal and symmetric with the symptomatic side. No evidence of thrombus. Normal compressibility. Common Femoral Vein: No evidence of thrombus. Normal compressibility, respiratory phasicity and response to augmentation. Saphenofemoral Junction: No evidence of thrombus. Normal compressibility and flow on color Doppler imaging. Profunda Femoral Vein: No evidence of thrombus. Normal compressibility and flow on color Doppler imaging. Femoral Vein: No evidence of thrombus. Normal compressibility, respiratory phasicity and response to augmentation. Popliteal Vein: No evidence of thrombus. Normal compressibility, respiratory phasicity and response to augmentation. Calf Veins: No evidence of thrombus. Normal compressibility and flow on color Doppler imaging. Superficial Great Saphenous Vein: No evidence of thrombus. Normal compressibility and flow on color Doppler imaging. Venous Reflux:  None. Other Findings:  None. IMPRESSION: No evidence of DVT within the left lower extremity. Electronically Signed   By: Sandi Mariscal M.D.   On: 08/26/2016 09:27    EKG:   Orders placed or performed in visit on 08/25/16  . EKG 12-Lead    ASSESSMENT AND PLAN:   * Healthcare acquired pneumonia, multifocal with sepsis and acute hypoxic respiratory failure Clinically improving at a slower pace Continue cefepime and azithromycin. Recent MRSA PCR was negative. Nebulizers as needed. Wean oxygen as tolerated. Incentive spirometry  * Atrial fibrillation with rapid ventricular rate due to sepsis Patient does have a  pacemaker in place. Received 1 dose of amiodarone 400 mg and continue her home dose amiodarone 100 mg by mouth once daily Telemetry. Continue Eliquis  * Hypertension Hold Norvasc for now and consider resuming in a.m. if blood pressure is stable   * DVT prophylaxis Patient is on Eliquis  Generalized weakness PT consult is placed   All the records are reviewed and case discussed with Care Management/Social Workerr. Management plans discussed with the patient, family and they are in agreement.  CODE STATUS: dnr  TOTAL TIME TAKING CARE OF THIS PATIENT: 32 minutes.   POSSIBLE D/C IN 1-2 DAYS, DEPENDING ON CLINICAL CONDITION.  Note: This dictation was prepared with Dragon dictation along with smaller phrase technology. Any transcriptional errors that result from this process are unintentional.   Nicholes Mango M.D on 08/27/2016 at 1:46 PM  Between 7am to 6pm - Pager - [phone] After 6pm go to [email or web address] - password [email or web address] [facility]  [facility]  Office  [phone]  CC: Primary care physician; Marinda Elk, MD

## 2016-08-27 NOTE — Care Management (Signed)
Anticipate discharge 08/28/2016.  Informed primary nurse of need to perform assessment for home 02 during progression

## 2016-08-28 LAB — CBC
HEMATOCRIT: 32.6 % — AB (ref 35.0–47.0)
Hemoglobin: 11.4 g/dL — ABNORMAL LOW (ref 12.0–16.0)
MCH: 31.2 pg (ref 26.0–34.0)
MCHC: 35 g/dL (ref 32.0–36.0)
MCV: 89.2 fL (ref 80.0–100.0)
PLATELETS: 281 10*3/uL (ref 150–440)
RBC: 3.66 MIL/uL — ABNORMAL LOW (ref 3.80–5.20)
RDW: 15.3 % — AB (ref 11.5–14.5)
WBC: 7.5 10*3/uL (ref 3.6–11.0)

## 2016-08-28 MED ORDER — FUROSEMIDE 40 MG PO TABS
40.0000 mg | ORAL_TABLET | Freq: Every day | ORAL | Status: DC
  Administered 2016-08-28 – 2016-08-29 (×2): 40 mg via ORAL
  Filled 2016-08-28 (×2): qty 1

## 2016-08-28 MED ORDER — METHYLPREDNISOLONE SODIUM SUCC 40 MG IJ SOLR
40.0000 mg | Freq: Every day | INTRAMUSCULAR | Status: DC
  Administered 2016-08-28 – 2016-08-29 (×2): 40 mg via INTRAVENOUS
  Filled 2016-08-28 (×2): qty 1

## 2016-08-28 MED ORDER — LACTULOSE 10 GM/15ML PO SOLN
10.0000 g | Freq: Once | ORAL | Status: AC
  Administered 2016-08-28: 10 g via ORAL
  Filled 2016-08-28: qty 30

## 2016-08-28 MED ORDER — ALBUTEROL SULFATE (2.5 MG/3ML) 0.083% IN NEBU
2.5000 mg | INHALATION_SOLUTION | Freq: Four times a day (QID) | RESPIRATORY_TRACT | Status: DC
  Administered 2016-08-28 – 2016-08-29 (×4): 2.5 mg via RESPIRATORY_TRACT
  Filled 2016-08-28 (×4): qty 3

## 2016-08-28 NOTE — Care Management (Signed)
Patient has qualified for home 02 and does not have agency preference.  Gave heads up referral to [facility].  Chronic dx is copd now with pneumonia.  Updated [facility].  Will add aide to home health order

## 2016-08-28 NOTE — Progress Notes (Signed)
SATURATION QUALIFICATIONS: (This note is used to comply with regulatory documentation for home oxygen)  Patient Saturations on Room Air at Rest = 97%  Patient Saturations on Room Air while Ambulating = 86%  Patient Saturations on 2 Liters of oxygen while Ambulating = 93%  Please briefly explain why patient needs home oxygen:

## 2016-08-28 NOTE — Progress Notes (Signed)
Pharmacy Antibiotic Note  Lydia [name] is a 82 y.o. female admitted on 08/25/2016 with pneumonia.  Pharmacy has been consulted for cefepime dosing.  Plan: Cefepime 2gm IV Q12H (renal dose adjustment for cefepime 2gm IV Q8H)  Height: 5\' 6"  (167.6 cm) Weight: 86 lb 9.6 oz (39.3 kg) IBW/kg (Calculated) : 59.3  Temp (24hrs), Avg:97.8 F (36.6 C), Min:97.7 F (36.5 C), Max:98 F (36.7 C)   Recent Labs Lab 08/25/16 1155 08/25/16 1334 08/26/16 0335 08/28/16 0423  WBC 10.3  --  7.6 7.5  CREATININE  --  0.71 0.79  --     Estimated Creatinine Clearance: 31.3 mL/min (by C-G formula based on SCr of 0.8 mg/dL).    Allergies  Allergen Reactions  . Penicillins Hives    Has patient had a PCN reaction causing immediate rash, facial/tongue/throat swelling, SOB or lightheadedness with hypotension: no Has patient had a PCN reaction causing severe rash involving mucus membranes or skin necrosis: No Has patient had a PCN reaction that required hospitalization No Has patient had a PCN reaction occurring within the last 10 years. No If all of the above answers are "NO", then may proceed with Cephalosporin use.    Antimicrobials this admission: 8/29 cefepime >>  8/29 azithromycin >>   Dose adjustments this admission:   Microbiology results: 8/29 BCx:  8/29 Sputum:   8/29 MRSA PCR:   Thank you for allowing pharmacy to be a part of this patient's care.  Tayna Smethurst D 08/28/2016 10:49 AM

## 2016-08-28 NOTE — Progress Notes (Signed)
CoPay:  Based on 750mg  vancomycin IV q day, copay estimated at $12.56/week.  No copay for [facility]

## 2016-08-28 NOTE — Clinical Social Work Note (Signed)
MSW met with patient to discuss SNF placement, and patient informed this MSW that she does not want to go back to a SNF.  Patient states she wants to go back home with home health.  MSW informed case manager, MSW will monitor in case patient changes her mind.  Jones Broom. Norval Morton, MSW [phone]  Mon-Fri 8a-4:30p 08/28/2016 4:29 PM

## 2016-08-28 NOTE — Care Management Important Message (Signed)
Important Message  Patient Details  Name: CINDE FUNNELL MRN: [id number] Date of Birth: 08-22-1927   Medicare Important Message Given:  Yes    Katrina Stack, RN 08/28/2016, 9:25 AM

## 2016-08-28 NOTE — Progress Notes (Signed)
Patient ID: Lydia [name], female   DOB: February 09, 1927, 82 y.o.   MRN: [id number]  [employer] PROGRESS NOTE  Lydia [name] [id number] DOB: July 14, 1927 DOA: 08/25/2016 PCP: Marinda Elk, MD  HPI/Subjective: Patient thinks she is declining. Some shortness of breath and cough. Some constipation. Has not walked since being in the hospital. Worsening shortness of breath once taken off the oxygen.  Objective: Vitals:   08/28/16 0330 08/28/16 0830  BP: 122/75 (!) 127/59  Pulse: 95 68  Resp: 18   Temp: 97.8 F (36.6 C)     Filed Weights   08/26/16 0400 08/27/16 0404 08/28/16 0330  Weight: 39.8 kg (87 lb 11.2 oz) 39.8 kg (87 lb 11.2 oz) 39.3 kg (86 lb 9.6 oz)    ROS: Review of Systems  Constitutional: Negative for chills and fever.  Eyes: Negative for blurred vision.  Respiratory: Positive for cough and shortness of breath.   Cardiovascular: Negative for chest pain.  Gastrointestinal: Positive for abdominal pain and constipation. Negative for diarrhea, nausea and vomiting.  Genitourinary: Negative for dysuria.  Musculoskeletal: Negative for joint pain.  Neurological: Negative for dizziness and headaches.   Exam: Physical Exam  Constitutional: She is oriented to person, place, and time.  HENT:  Nose: No mucosal edema.  Mouth/Throat: No oropharyngeal exudate or posterior oropharyngeal edema.  Eyes: Conjunctivae, EOM and lids are normal. Pupils are equal, round, and reactive to light.  Neck: No JVD present. Carotid bruit is not present. No edema present. No thyroid mass and no thyromegaly present.  Cardiovascular: S1 normal and S2 normal.  Exam reveals no gallop.   No murmur heard. Pulses:      Dorsalis pedis pulses are 2+ on the right side, and 2+ on the left side.  Respiratory: Accessory muscle usage present. No respiratory distress. She has decreased breath sounds in the right upper field, the right middle field, the right lower field, the left upper field, the left  middle field and the left lower field. She has wheezes in the right upper field and the left upper field. She has rhonchi in the right lower field and the left lower field. She has no rales.  GI: Soft. Bowel sounds are normal. There is no tenderness.  Musculoskeletal:       Right ankle: She exhibits no swelling.       Left ankle: She exhibits no swelling.  Lymphadenopathy:    She has no cervical adenopathy.  Neurological: She is alert and oriented to person, place, and time. No cranial nerve deficit.  Skin: Skin is warm. No rash noted. Nails show no clubbing.  Psychiatric: She has a normal mood and affect.      Data Reviewed: Basic Metabolic Panel:  Recent Labs Lab 08/25/16 1334 08/26/16 0335  NA 135 138  K 4.7 4.2  CL 101 102  CO2 25 29  GLUCOSE 97 100*  BUN 29* 31*  CREATININE 0.71 0.79  CALCIUM 8.3* 8.0*   Liver Function Tests:  Recent Labs Lab 08/25/16 1334  AST 27  ALT 28  ALKPHOS 84  BILITOT 0.6  PROT 6.2*  ALBUMIN 2.6*   CBC:  Recent Labs Lab 08/25/16 1155 08/26/16 0335 08/28/16 0423  WBC 10.3 7.6 7.5  NEUTROABS 9.1*  --   --   HGB 12.6 11.6* 11.4*  HCT 36.3 33.8* 32.6*  MCV 89.4 90.0 89.2  PLT 315 294 281   Cardiac Enzymes:  Recent Labs Lab 08/25/16 1155  TROPONINI <0.03   BNP (last  3 results)  Recent Labs  08/25/16 1550  BNP 301.0*     Recent Results (from the past 240 hour(s))  Culture, blood (routine x 2)     Status: None (Preliminary result)   Collection Time: 08/25/16  1:34 PM  Result Value Ref Range Status   Specimen Description BLOOD  RIGHT THUMB  Final   Special Requests   Final    BOTTLES DRAWN AEROBIC AND ANAEROBIC  ANA .5ML AER .5ML   Culture NO GROWTH 2 DAYS  Final   Report Status PENDING  Incomplete  Culture, blood (routine x 2)     Status: None (Preliminary result)   Collection Time: 08/25/16  1:47 PM  Result Value Ref Range Status   Specimen Description BLOOD  LEFT ARM  Final   Special Requests   Final     BOTTLES DRAWN AEROBIC AND ANAEROBIC  ANA .5ML AER 1ML   Culture NO GROWTH 2 DAYS  Final   Report Status PENDING  Incomplete     Studies: US Venous Img Lower Unilateral Left  Result Date: 08/26/2016 CLINICAL DATA:  Left lower extremity pain and swelling for the past 2 weeks. Evaluate for DVT. EXAM: LEFT LOWER EXTREMITY VENOUS DOPPLER ULTRASOUND TECHNIQUE: Gray-scale sonography with graded compression, as well as color Doppler and duplex ultrasound were performed to evaluate the lower extremity deep venous systems from the level of the common femoral vein and including the common femoral, femoral, profunda femoral, popliteal and calf veins including the posterior tibial, peroneal and gastrocnemius veins when visible. The superficial great saphenous vein was also interrogated. Spectral Doppler was utilized to evaluate flow at rest and with distal augmentation maneuvers in the common femoral, femoral and popliteal veins. COMPARISON:  None. FINDINGS: Contralateral Common Femoral Vein: Respiratory phasicity is normal and symmetric with the symptomatic side. No evidence of thrombus. Normal compressibility. Common Femoral Vein: No evidence of thrombus. Normal compressibility, respiratory phasicity and response to augmentation. Saphenofemoral Junction: No evidence of thrombus. Normal compressibility and flow on color Doppler imaging. Profunda Femoral Vein: No evidence of thrombus. Normal compressibility and flow on color Doppler imaging. Femoral Vein: No evidence of thrombus. Normal compressibility, respiratory phasicity and response to augmentation. Popliteal Vein: No evidence of thrombus. Normal compressibility, respiratory phasicity and response to augmentation. Calf Veins: No evidence of thrombus. Normal compressibility and flow on color Doppler imaging. Superficial Great Saphenous Vein: No evidence of thrombus. Normal compressibility and flow on color Doppler imaging. Venous Reflux:  None. Other Findings:  None.  IMPRESSION: No evidence of DVT within the left lower extremity. Electronically Signed   By: Sandi Mariscal M.D.   On: 08/26/2016 09:27    Scheduled Meds: . albuterol  2.5 mg Nebulization Q6H  . amiodarone  100 mg Oral Daily  . apixaban  2.5 mg Oral BID  . arformoterol  15 mcg Nebulization BID  . atorvastatin  10 mg Oral q1800  . azithromycin  250 mg Oral Daily  . budesonide  0.5 mg Nebulization BID  . ceFEPime (MAXIPIME) IV  2 g Intravenous Q12H  . diltiazem  60 mg Oral Q8H  . docusate sodium  100 mg Oral QHS  . feeding supplement (ENSURE ENLIVE)  237 mL Oral BID BM  . furosemide  40 mg Oral Daily  . methylPREDNISolone (SOLU-MEDROL) injection  40 mg Intravenous Daily  . pantoprazole  40 mg Oral Daily  . potassium chloride  10 mEq Oral QHS  . sodium chloride flush  3 mL Intravenous Q12H  . sodium  chloride flush  3 mL Intravenous Q12H    Assessment/Plan:  1. Acute respiratory failure with hypoxia. Pulse ox 84% on room air respiratory nursing staff. Will see if I can qualify for home oxygen. Would like to have better air entry prior to disposition. 2. Healthcare acquired pneumonia, clinical sepsis. Patient has poor air entry today. I will give DuoNeb standing dose. Continue budesonide nebulizers. Add Solu-Medrol 40 mg IV daily. Continue cefepime and Zithromax. 3. Atrial fibrillation with rapid ventricular response. Since the patient does have a pacemaker can continue amiodarone and Cardizem. Patient anticoagulated with Eliquis 4. Essential hypertension. Continue Cardizem instead of Norvasc 5. Generalized weakness. Awaiting physical therapy consultation.  Code Status:     Code Status Orders        Start     Ordered   08/25/16 1402  Do not attempt resuscitation (DNR)  Continuous    Question Answer Comment  In the event of cardiac or respiratory ARREST Do not call a "code blue"   In the event of cardiac or respiratory ARREST Do not perform Intubation, CPR, defibrillation or ACLS    In the event of cardiac or respiratory ARREST Use medication by any route, position, wound care, and other measures to relive pain and suffering. May use oxygen, suction and manual treatment of airway obstruction as needed for comfort.      08/25/16 1402    Code Status History    Date Active Date Inactive Code Status Order ID Comments User Context   06/08/2016  6:00 PM 06/16/2016  7:52 PM Full Code [id number]  Baldwin Jamaica, PA-C Inpatient   06/08/2016  5:53 PM 06/08/2016  6:00 PM Full Code [id number]  Corey Harold, NP Inpatient   06/05/2016  9:33 AM 06/08/2016  5:27 PM DNR [id number]  Demetrios Loll, MD Inpatient      Disposition Plan: Depending on physical therapy evaluation and potentially home over the weekend.  Antibiotics:  Cefepime  Zithromax  Time spent: 25 minutes  Loletha Grayer  [employer]

## 2016-08-28 NOTE — Clinical Social Work Note (Signed)
Clinical Social Work Assessment  Patient Details  Name: Lydia [name] MRN: [id number] Date of Birth: 11/22/1927  Date of referral:  08/28/16               Reason for consult:  Facility Placement                Permission sought to share information with:  Facility Sport and exercise psychologist, Family Supports Permission granted to share information::  Yes, Verbal Permission Granted  Name::     Danijela, [place] [phone]  [phone]   Agency::  SNF admissions  Relationship::     Contact Information:     Housing/Transportation Living arrangements for the past 2 months:  Single Family Home Source of Information:  Patient Patient Interpreter Needed:  None Criminal Activity/Legal Involvement Pertinent to Current Situation/Hospitalization:  No - Comment as needed Significant Relationships:  Spouse Lives with:  Spouse Do you feel safe going back to the place where you live?  Yes Need for family participation in patient care:  No (Coment)  Care giving concerns:  Patient states she would like to discharge back home with home health.   Social Worker assessment / plan:  Patient is an 82 year old female who is married and lives with her husband.  Patient is alert and oriented x4 and able to express her feelings.  Patient states she has been in rehab in the past, and does not want to return right now.  Patient expressed she was not very happy with the care that was provided at the SNF.  Patient expressed that her husband would like her to go home as well.  Patient did not give MSW permission at this time to begin bed search process.  Patient requested that MSW ask case manager to set up home health for her.  Employment status:  Retired Nurse, adult PT Recommendations:  [place] / Referral to community resources:  [place]  Patient/Family's Response to care:  Patient is not agreeable to going to SNF or to let MSW begin  bed search process.  Patient/Family's Understanding of and Emotional Response to Diagnosis, Current Treatment, and Prognosis:  Patient feels like she is doing better and she thinks she will be okay receiving therapy at home.  Emotional Assessment Appearance:  Appears stated age Attitude/Demeanor/Rapport:    Affect (typically observed):  Appropriate, Calm, Blunt Orientation:  Oriented to Self, Oriented to Place, Oriented to  Time, Oriented to Situation Alcohol / Substance use:  Not Applicable Psych involvement (Current and /or in the community):  No (Comment)  Discharge Needs  Concerns to be addressed:  No discharge needs identified Readmission within the last 30 days:  No Current discharge risk:  None Barriers to Discharge:      Ross Ludwig 08/28/2016, 4:44 PM

## 2016-08-28 NOTE — Care Management (Signed)
Physical therapy is recommending skilled nursing placement.  Patient is agreeable to have a bed search and would not want to go to [facility].  Notified CSW.  Will have 2 discharge plans in place: SNF vs  Home health with [facility]: SN PT OT Aide and social work in the event patient discharge home and needs SNF.  [facility] will be delivering the portable 02 to the patient's room in anticipation of discharge home over the weekend

## 2016-08-28 NOTE — Evaluation (Addendum)
Physical Therapy Evaluation Patient Details Name: Lydia [name] MRN: [id number] DOB: May 20, 1927 Today's Date: 08/28/2016   History of Present Illness  presented to ER secondary to worsening SOB (sent from pulmonary office due to sats in 80s on RA); admitted with multi-focal PNA, afib with RVR.  Currently on 2L supplemental O2 via [place].  Of note, patient with recent admission to [facility] in June 2017 with transfer to [facility] for PPM placement; complicated by PNA, CVA post-procedure.  Discharged to STR from that hospitalization; home with [facility] services prior to this admission.  Clinical Impression  Upon evaluation, patient alert and oriented; follows all commands.  Generally weak and deconditioned throughout all extremities with mod SOB noted during all functional activities.  Requiring cga/min assist with RW for participation with all transfers and gait (220'), mildly unsteady/staggering at times.  Did require three standing rest breaks to complete distance with noted desat to 86-87% during gait trial (requiring 2 min seated rest period for recovery).  Anticipate possible need for home O2. Would benefit from skilled PT to address above deficits and promote optimal return to PLOF; recommend transition to STR upon discharge from acute hospitalization.  SaO2 on 2 liters at rest = 93% SaO2 on 2 liters of O2 while ambulating = 86%      Follow Up Recommendations SNF    Equipment Recommendations  Rolling walker with 5" wheels    Recommendations for Other Services       Precautions / Restrictions Precautions Precautions: Fall;ICD/Pacemaker Restrictions Weight Bearing Restrictions: No      Mobility  Bed Mobility Overal bed mobility: Modified Independent                Transfers Overall transfer level: Needs assistance Equipment used: Rolling walker (2 wheeled) Transfers: Sit to/from Stand Sit to Stand: Min assist            Ambulation/Gait Ambulation/Gait assistance: Min assist;Min  guard Ambulation Distance (Feet): 210 Feet Assistive device: Rolling walker (2 wheeled)     Gait velocity interpretation: <1.8 ft/sec, indicative of risk for recurrent falls General Gait Details: forward flexed posture.  Slightly unsteady with increased sway/stagger laterally at times, but no overt buckling/LOB.  Requires 3 standing rest periods to complete distance with marked deficits in cardiopulmonary endurance noted (desat to 86-87% on 2L requiring 2 min seated rest for recovery)  Stairs            Wheelchair Mobility    Modified Rankin (Stroke Patients Only)       Balance Overall balance assessment: Needs assistance Sitting-balance support: No upper extremity supported;Feet supported Sitting balance-Leahy Scale: Good     Standing balance support: Bilateral upper extremity supported Standing balance-Leahy Scale: Fair                               Pertinent Vitals/Pain Pain Assessment: No/denies pain    Home Living                        Prior Function                 Hand Dominance        Extremity/Trunk Assessment   Upper Extremity Assessment: Generalized weakness           Lower Extremity Assessment: Generalized weakness (grossly 4-/5 throughout; scattered bruising throughout LEs)         Communication  Cognition Arousal/Alertness: Awake/alert Behavior During Therapy: WFL for tasks assessed/performed Overall Cognitive Status: Within Functional Limits for tasks assessed                      General Comments      Exercises        Assessment/Plan    PT Assessment Patient needs continued PT services  PT Diagnosis Difficulty walking;Generalized weakness   PT Problem List Decreased strength;Decreased activity tolerance;Decreased balance;Decreased mobility;Decreased knowledge of use of DME;Cardiopulmonary status limiting activity;Decreased safety awareness;Decreased knowledge of precautions  PT  Treatment Interventions DME instruction;Stair training;Functional mobility training;Therapeutic activities;Therapeutic exercise;Gait training;Balance training;Patient/family education   PT Goals (Current goals can be found in the Care Plan section) Acute Rehab PT Goals Patient Stated Goal: to return home PT Goal Formulation: With patient Time For Goal Achievement: 09/11/16 Potential to Achieve Goals: Good    Frequency Min 2X/week   Barriers to discharge Decreased caregiver support      Co-evaluation               End of Session Equipment Utilized During Treatment: Gait belt;Oxygen Activity Tolerance: Patient limited by fatigue Patient left: in chair;with call bell/phone within reach;with chair alarm set;with family/visitor present           Time: [id number] PT Time Calculation (min) (ACUTE ONLY): 18 min   Charges:   PT Evaluation $PT Eval Moderate Complexity: 1 Procedure     PT G Codes:        Amarian Botero H. Owens Shark, PT, DPT, NCS 08/28/16, 3:25 PM [phone]

## 2016-08-28 NOTE — Care Management (Addendum)
Barrier- Patient has not been mobilized and no assessment of need for home oxygen.  Patient is not moving much air per attending. Discussed the need to obtain assessment for home oxygen with primary nurse.  Have requested this over the previous two days but no documentation of assessment.  There is a  room air exertional sat from 8/31 on the flow sheet but can not be accepted because there is no resting room air sat nor a "recovery" sat on exertion.  Physical therapy consult is pending.  Requesting 02 order from attending in anticipation patient will qualify for home 02.

## 2016-08-29 MED ORDER — POTASSIUM CHLORIDE ER 10 MEQ PO TBCR: 10.0000 meq | EXTENDED_RELEASE_TABLET | Freq: Every day | ORAL | 0 refills | Status: AC

## 2016-08-29 MED ORDER — DILTIAZEM HCL ER COATED BEADS 240 MG PO CP24: 240.0000 mg | ORAL_CAPSULE | Freq: Every day | ORAL | 0 refills | Status: AC

## 2016-08-29 MED ORDER — POLYETHYLENE GLYCOL 3350 17 G PO PACK: 17.0000 g | PACK | Freq: Every day | ORAL | 0 refills | Status: AC | PRN

## 2016-08-29 MED ORDER — FUROSEMIDE 40 MG PO TABS: 40.0000 mg | ORAL_TABLET | Freq: Every day | ORAL | 0 refills | Status: AC

## 2016-08-29 MED ORDER — PREDNISONE 5 MG PO TABS: ORAL_TABLET | ORAL | 0 refills | Status: AC

## 2016-08-29 MED ORDER — LEVOFLOXACIN 500 MG PO TABS: 500.0000 mg | ORAL_TABLET | Freq: Every day | ORAL | 0 refills | Status: AC

## 2016-08-29 MED ORDER — DILTIAZEM HCL ER COATED BEADS 240 MG PO CP24
240.0000 mg | ORAL_CAPSULE | Freq: Every day | ORAL | Status: DC
  Administered 2016-08-29: 240 mg via ORAL
  Filled 2016-08-29: qty 1

## 2016-08-29 MED ORDER — ARFORMOTEROL TARTRATE 15 MCG/2ML IN NEBU: 15.0000 ug | INHALATION_SOLUTION | Freq: Two times a day (BID) | RESPIRATORY_TRACT | 0 refills | Status: AC

## 2016-08-29 NOTE — Discharge Summary (Signed)
[facility] [place] NAME: Lydia [name]    MR#:  [id number]  DATE OF BIRTH:  08/11/1927  DATE OF ADMISSION:  08/25/2016 ADMITTING PHYSICIAN: Hillary Bow, MD  DATE OF DISCHARGE: 08/29/2016.  PRIMARY CARE PHYSICIAN: MCLAUGHLIN, MIRIAM K, MD    ADMISSION DIAGNOSIS:  HCAP (healthcare-associated pneumonia) [J18.9]  DISCHARGE DIAGNOSIS:  Active Problems:   Pneumonia   SECONDARY DIAGNOSIS:   Past Medical History:  Diagnosis Date  . COPD (chronic obstructive pulmonary disease) ([place])   . Essential hypertension   . History of stomach ulcers    a. s/p surgery ~ 40 yrs ago.  . IDA (iron deficiency anemia)    a. 04/2016 CT Abd: no malignancy.  . Lower back pain   . New onset atrial flutter ([place]) 05/2016  . Osteoporosis   . PAD (peripheral artery disease) ([place])    a. 04/2016 CT Abd: occluded prox LSFA.  . Polycythemia    a. phlebotomy.  . Pulmonary nodule, left    a. 04/2016 CT chest: 67mm LUL nodule - f/u CT in 12 months (h/o smoking).  . Superior mesenteric artery stenosis ([place])    a. 04/2016 CT Abd: atheromatous plaque and mural thrombus in SMA.  . Tachy-brady syndrome ([place]) 05/2016   a. to [facility] for PPM placement  . Thyroid nodule    a. 04/2016 large left thyroid nodule on CT chest  - rec dedicated u/s.  . Tobacco abuse   . Weight loss, unintentional     HOSPITAL COURSE:   1. Acute respiratory failure with hypoxia. Now this is chronic respiratory failure. Patient has a pulse ox of 84% on room air. Patient qualifies for home oxygen 2 L nasal cannula continuous at all times. 2. Health care acquired pneumonia. Clinical sepsis. Improved air entry today. Patient has nebulizers at home including albuterol budesonide and Brovana. Solu-Medrol was given started yesterday. I will switch over to oral Levaquin upon going home. I will taper prednisone from 40 mg down to 5 mg continuous. Patient finished Zithromax course while here. Patient was also on  cefepime and initially vancomycin. 3. COPD exacerbation. Patient required IV Solu-Medrol to move better air and to break down the wheeze. Lungs were clear upon disposition home. Chronic prednisone prescribed. 4. Atrial fibrillation with rapid ventricular response. Since the patient has a pacemaker I will continue amiodarone and I added long-acting Cardizem CD for better heart rate control. Patient is also on eliquis for anticoagulation. 5. Essential hypertension. Continue Cardizem instead of Norvasc. 6. Generalized weakness. Physical therapy recommended rehabilitation. Patient and family refused. They would like to go home with home health. Home health set up along with home oxygen. Bedside commode. 7. The patient also mentioned the possibility of hospice which is a good idea if she declines. Family wanted the home health help at this point. Unable to set up both home health and hospice at the same time. 8. Malnutrition and cachexia. Overall prognosis poor.  DISCHARGE CONDITIONS:   Guarded  CONSULTS OBTAINED:   none  DRUG ALLERGIES:   Allergies  Allergen Reactions  . Penicillins Hives    Has patient had a PCN reaction causing immediate rash, facial/tongue/throat swelling, SOB or lightheadedness with hypotension: no Has patient had a PCN reaction causing severe rash involving mucus membranes or skin necrosis: No Has patient had a PCN reaction that required hospitalization No Has patient had a PCN reaction occurring within the last 10 years. No If all of the above answers are "  NO", then may proceed with Cephalosporin use.    DISCHARGE MEDICATIONS:   Current Discharge Medication List    START taking these medications   Details  diltiazem (CARDIZEM CD) 240 MG 24 hr capsule Take 1 capsule (240 mg total) by mouth daily. Qty: 30 capsule, Refills: 0    levofloxacin (LEVAQUIN) 500 MG tablet Take 1 tablet (500 mg total) by mouth daily. Qty: 4 tablet, Refills: 0    polyethylene glycol  (MIRALAX / GLYCOLAX) packet Take 17 g by mouth daily as needed for mild constipation. Qty: 14 each, Refills: 0    predniSONE (DELTASONE) 5 MG tablet 8 tabs po day1; 7 tabs po day2; 6 tabs po day3; 5 tabs po day4; 4 tabs po day5; 3 tabs po day6; 2 tabs po day7, 1 tab daily afterwards Qty: 60 tablet, Refills: 0      CONTINUE these medications which have CHANGED   Details  arformoterol (BROVANA) 15 MCG/2ML NEBU Take 2 mLs (15 mcg total) by nebulization 2 (two) times daily. Qty: 120 mL, Refills: 0    furosemide (LASIX) 40 MG tablet Take 1 tablet (40 mg total) by mouth at bedtime. Qty: 30 tablet, Refills: 0    potassium chloride (K-DUR) 10 MEQ tablet Take 1 tablet (10 mEq total) by mouth at bedtime. With furosemide. Qty: 30 tablet, Refills: 0      CONTINUE these medications which have NOT CHANGED   Details  amiodarone (PACERONE) 200 MG tablet Take 100 mg by mouth daily.     apixaban (ELIQUIS) 2.5 MG TABS tablet Take 1 tablet (2.5 mg total) by mouth 2 (two) times daily.    atorvastatin (LIPITOR) 10 MG tablet Take 1 tablet (10 mg total) by mouth daily at 6 PM.    budesonide (PULMICORT) 0.5 MG/2ML nebulizer solution Take 2 mLs (0.5 mg total) by nebulization 2 (two) times daily.    docusate sodium (COLACE) 100 MG capsule Take 100 mg by mouth at bedtime.    ipratropium-albuterol (DUONEB) 0.5-2.5 (3) MG/3ML SOLN Take 3 mLs by nebulization every 6 (six) hours as needed.    omeprazole (PRILOSEC) 20 MG capsule Take 20 mg by mouth daily. For Gerd   Associated Diagnoses: Iron deficiency anemia due to chronic blood loss      STOP taking these medications     amLODipine (NORVASC) 5 MG tablet          DISCHARGE INSTRUCTIONS:   Follow-up one week PMD Consideration for hospice referral if declines  If you experience worsening of your admission symptoms, develop shortness of breath, life threatening emergency, suicidal or homicidal thoughts you must seek medical attention immediately by  calling 911 or calling your MD immediately  if symptoms less severe.  You Must read complete instructions/literature along with all the possible adverse reactions/side effects for all the Medicines you take and that have been prescribed to you. Take any new Medicines after you have completely understood and accept all the possible adverse reactions/side effects.   Please note  You were cared for by a hospitalist during your hospital stay. If you have any questions about your discharge medications or the care you received while you were in the hospital after you are discharged, you can call the unit and asked to speak with the hospitalist on call if the hospitalist that took care of you is not available. Once you are discharged, your primary care physician will handle any further medical issues. Please note that NO REFILLS for any discharge medications will be authorized  once you are discharged, as it is imperative that you return to your primary care physician (or establish a relationship with a primary care physician if you do not have one) for your aftercare needs so that they can reassess your need for medications and monitor your lab values.    Today   CHIEF COMPLAINT:   Chief Complaint  Patient presents with  . Shortness of Breath    HISTORY OF PRESENT ILLNESS:  Kiare Hott  is a 82 y.o. female presented with shortness of breath and found to have a pneumonia   VITAL SIGNS:  Blood pressure (!) 115/54, pulse 60, temperature 97.8 F (36.6 C), temperature source Oral, resp. rate 16, height 5\' 6"  (1.676 m), weight 39 kg (85 lb 14.4 oz), SpO2 100 %.   PHYSICAL EXAMINATION:  GENERAL:  83 y.o.-year-old patient lying in the bed with no acute distress. Cachexia. EYES: Pupils equal, round, reactive to light and accommodation. No scleral icterus. Extraocular muscles intact.  HEENT: Head atraumatic, normocephalic. Oropharynx and nasopharynx clear.  NECK:  Supple, no jugular venous  distention. No thyroid enlargement, no tenderness.  LUNGS: Decreased breath sounds bilaterally, no wheezing, rales, rhonchi or crepitation. No use of accessory muscles of respiration.  CARDIOVASCULAR: S1, S2 normal. No murmurs, rubs, or gallops.  ABDOMEN: Soft, non-tender, non-distended. Bowel sounds present. No organomegaly or mass.  EXTREMITIES: No pedal edema, cyanosis, or clubbing.  NEUROLOGIC: Cranial nerves II through XII are intact. Muscle strength 5/5 in all extremities. Sensation intact. Gait not checked.  PSYCHIATRIC: The patient is alert and oriented x 3.  SKIN: No obvious rash, lesion, or ulcer.   DATA REVIEW:   CBC  Recent Labs Lab 08/28/16 0423  WBC 7.5  HGB 11.4*  HCT 32.6*  PLT 281    Chemistries   Recent Labs Lab 08/25/16 1334 08/26/16 0335  NA 135 138  K 4.7 4.2  CL 101 102  CO2 25 29  GLUCOSE 97 100*  BUN 29* 31*  CREATININE 0.71 0.79  CALCIUM 8.3* 8.0*  AST 27  --   ALT 28  --   ALKPHOS 84  --   BILITOT 0.6  --     Cardiac Enzymes  Recent Labs Lab 08/25/16 1155  TROPONINI <0.03    Microbiology Results  Results for orders placed or performed during the hospital encounter of 08/25/16  Culture, blood (routine x 2)     Status: None (Preliminary result)   Collection Time: 08/25/16  1:34 PM  Result Value Ref Range Status   Specimen Description BLOOD  RIGHT THUMB  Final   Special Requests   Final    BOTTLES DRAWN AEROBIC AND ANAEROBIC  ANA .5ML AER .5ML   Culture NO GROWTH 4 DAYS  Final   Report Status PENDING  Incomplete  Culture, blood (routine x 2)     Status: None (Preliminary result)   Collection Time: 08/25/16  1:47 PM  Result Value Ref Range Status   Specimen Description BLOOD  LEFT ARM  Final   Special Requests   Final    BOTTLES DRAWN AEROBIC AND ANAEROBIC  ANA .5ML AER 1ML   Culture NO GROWTH 4 DAYS  Final   Report Status PENDING  Incomplete    Management plans discussed with the patient, family and they are in  agreement.  CODE STATUS:     Code Status Orders        Start     Ordered   08/25/16 1402  Do not attempt resuscitation (  DNR)  Continuous    Question Answer Comment  In the event of cardiac or respiratory ARREST Do not call a "code blue"   In the event of cardiac or respiratory ARREST Do not perform Intubation, CPR, defibrillation or ACLS   In the event of cardiac or respiratory ARREST Use medication by any route, position, wound care, and other measures to relive pain and suffering. May use oxygen, suction and manual treatment of airway obstruction as needed for comfort.      08/25/16 1402    Code Status History    Date Active Date Inactive Code Status Order ID Comments User Context   06/08/2016  6:00 PM 06/16/2016  7:52 PM Full Code [id number]  Baldwin Jamaica, PA-C Inpatient   06/08/2016  5:53 PM 06/08/2016  6:00 PM Full Code [id number]  Corey Harold, NP Inpatient   06/05/2016  9:33 AM 06/08/2016  5:27 PM DNR [id number]  Demetrios Loll, MD Inpatient      TOTAL TIME TAKING CARE OF THIS PATIENT: 40 minutes.    Loletha Grayer M.D on 08/29/2016 at 2:32 PM  Between 7am to 6pm - Pager - [phone]  After 6pm go to [email or web address] - password [email or web address]  [facility] Office  [phone]  CC: Primary care physician; Marinda Elk, MD

## 2016-08-29 NOTE — Care Management Note (Signed)
Case Management Note  Patient Details  Name: Lydia [name] MRN: [id number] Date of Birth: 1927-03-25  Subjective/Objective:    Referral for home health PT, OT, RN, SW, Aide called and faxed to [facility] [place]. Ms Garron has a portable oxygen tank at bedside from [place]. A referral requesting a home oxygen setup today for new home oxygen was faxed to [facility] along with a request for a BSC to be delivered to Ms Rinehart at home today along with her home oxygen.               Action/Plan:   Expected Discharge Date:                  Expected Discharge Plan:     In-House Referral:     Discharge planning Services     Post Acute Care Choice:    Choice offered to:     DME Arranged:    DME Agency:     HH Arranged:    HH Agency:     Status of Service:     If discussed at [employer] of Stay Meetings, dates discussed:    Additional Comments:  Rye Decoste A, RN 08/29/2016, 2:21 PM

## 2016-08-29 NOTE — Progress Notes (Signed)
Patient discharged home with home health. Patient was picked up by son, on oxygen tank. Discharge instructions given and explained to patient, verbalized understanding. Made aware of medications to be picked up at pharmacy. IV removed, no complications noted. Patient discharge with belongings.

## 2016-08-30 LAB — CULTURE, BLOOD (ROUTINE X 2)
Culture: NO GROWTH
Culture: NO GROWTH

## 2016-09-09 ENCOUNTER — Inpatient Hospital Stay: Payer: Medicare HMO

## 2016-09-11 ENCOUNTER — Ambulatory Visit: Payer: Medicare HMO | Admitting: Internal Medicine

## 2016-09-15 ENCOUNTER — Encounter: Payer: Medicare HMO | Admitting: Internal Medicine

## 2016-09-16 ENCOUNTER — Inpatient Hospital Stay: Payer: Medicare HMO

## 2016-09-16 ENCOUNTER — Inpatient Hospital Stay: Payer: Medicare HMO | Admitting: Internal Medicine

## 2016-09-21 ENCOUNTER — Emergency Department
Admission: EM | Admit: 2016-09-21 | Discharge: 2016-09-21 | Disposition: A | Discharge status: Home / Self Care | Attending: Student | Admitting: Student

## 2016-09-21 ENCOUNTER — Emergency Department

## 2016-09-21 DIAGNOSIS — J449 Chronic obstructive pulmonary disease, unspecified: Secondary | ICD-10-CM | POA: Insufficient documentation

## 2016-09-21 DIAGNOSIS — S32592A Other specified fracture of left pubis, initial encounter for closed fracture: Secondary | ICD-10-CM | POA: Insufficient documentation

## 2016-09-21 DIAGNOSIS — Z87891 Personal history of nicotine dependence: Secondary | ICD-10-CM | POA: Insufficient documentation

## 2016-09-21 DIAGNOSIS — Y939 Activity, unspecified: Secondary | ICD-10-CM | POA: Diagnosis not present

## 2016-09-21 DIAGNOSIS — W19XXXA Unspecified fall, initial encounter: Secondary | ICD-10-CM

## 2016-09-21 DIAGNOSIS — Y929 Unspecified place or not applicable: Secondary | ICD-10-CM | POA: Diagnosis not present

## 2016-09-21 DIAGNOSIS — Y999 Unspecified external cause status: Secondary | ICD-10-CM | POA: Insufficient documentation

## 2016-09-21 DIAGNOSIS — W010XXA Fall on same level from slipping, tripping and stumbling without subsequent striking against object, initial encounter: Secondary | ICD-10-CM | POA: Insufficient documentation

## 2016-09-21 DIAGNOSIS — Z79899 Other long term (current) drug therapy: Secondary | ICD-10-CM | POA: Insufficient documentation

## 2016-09-21 DIAGNOSIS — Z9981 Dependence on supplemental oxygen: Secondary | ICD-10-CM | POA: Diagnosis not present

## 2016-09-21 DIAGNOSIS — I10 Essential (primary) hypertension: Secondary | ICD-10-CM | POA: Insufficient documentation

## 2016-09-21 DIAGNOSIS — M25552 Pain in left hip: Secondary | ICD-10-CM

## 2016-09-21 DIAGNOSIS — S79912A Unspecified injury of left hip, initial encounter: Secondary | ICD-10-CM | POA: Diagnosis present

## 2016-09-21 IMAGING — CT CT HEAD W/O CM
3 of 4 series · 17 of 47 positions shown, 20 images · non-contrast
Comparison: None.

CLINICAL DATA: Left-sided weakness

EXAM:
CT HEAD WITHOUT CONTRAST
TECHNIQUE: Contiguous axial images were obtained from the base of the skull
through the vertex without intravenous contrast.

[Series 201: head w/o, idose (1) · axial · non-contrast · 0.46mm/px · z∈[+84,+224]mm · 11 of 34 slices shown, 14 images]
[im 3/34  brain]
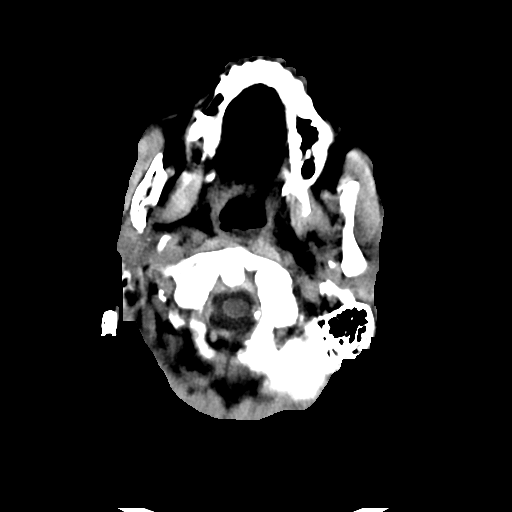
[im 3/34  bone]
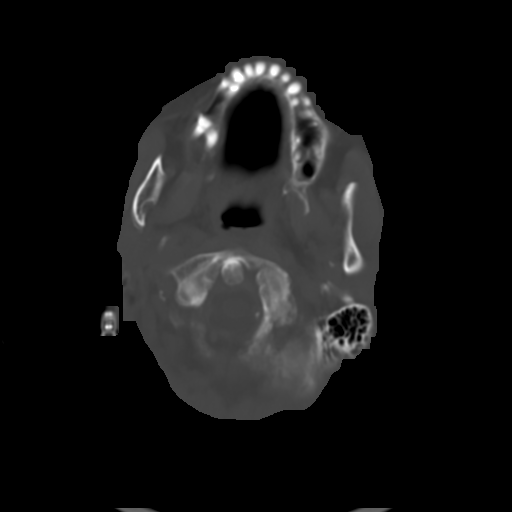
[im 5/34  brain]
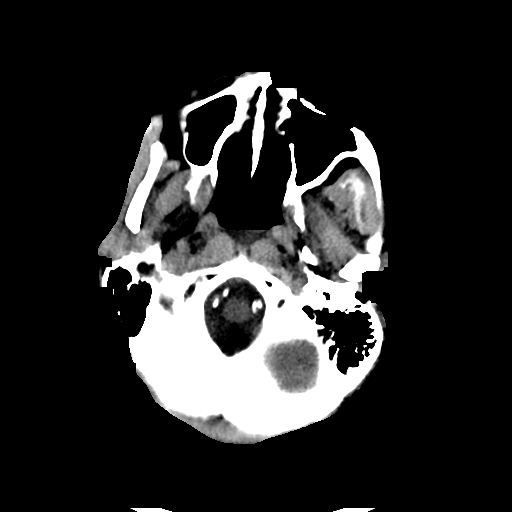
[im 8/34  brain]
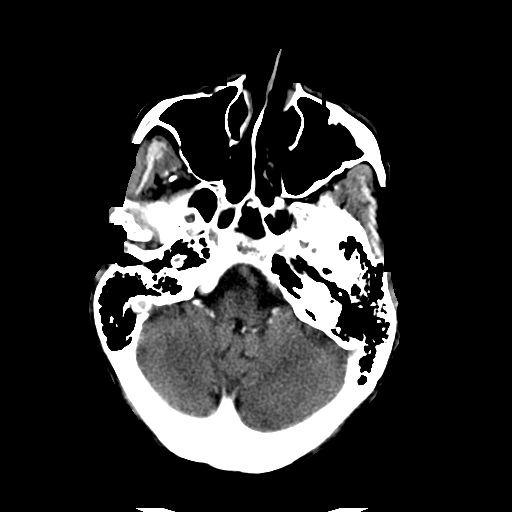
[im 12/34  brain]
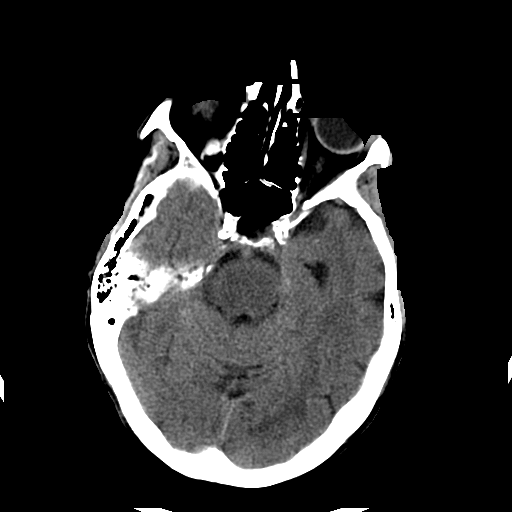
[im 15/34  brain]
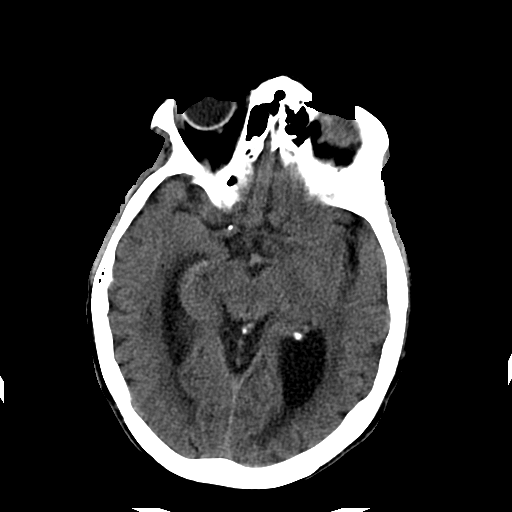
[im 15/34  bone]
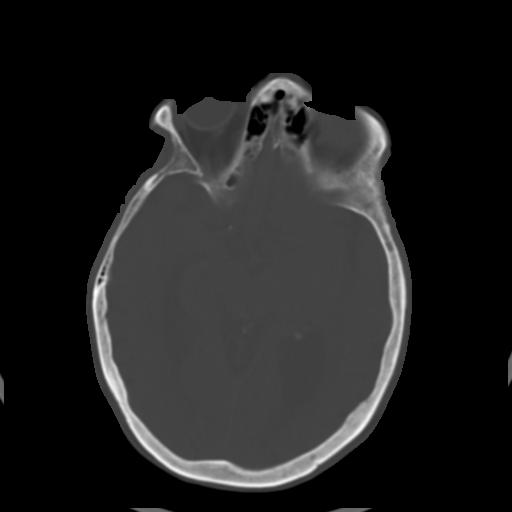
[im 17/34  brain]
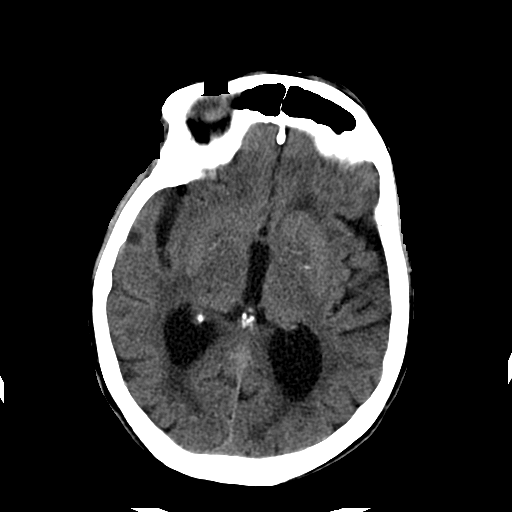
[im 19/34  brain]
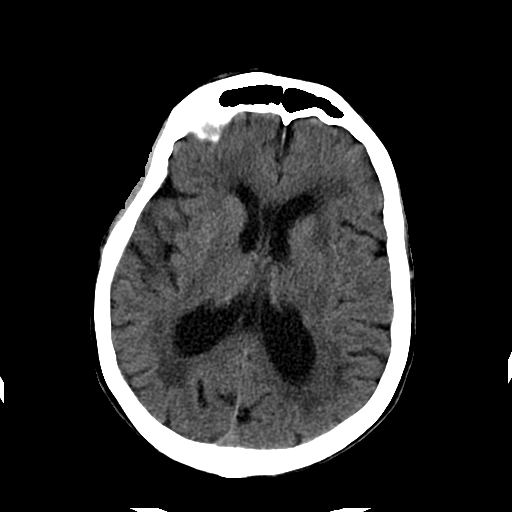
[im 22/34  brain]
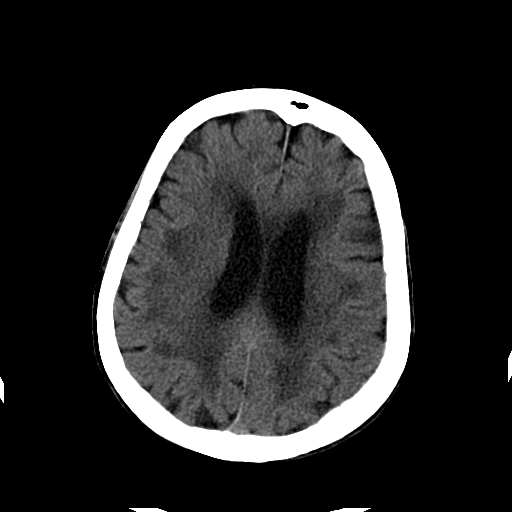
[im 26/34  brain]
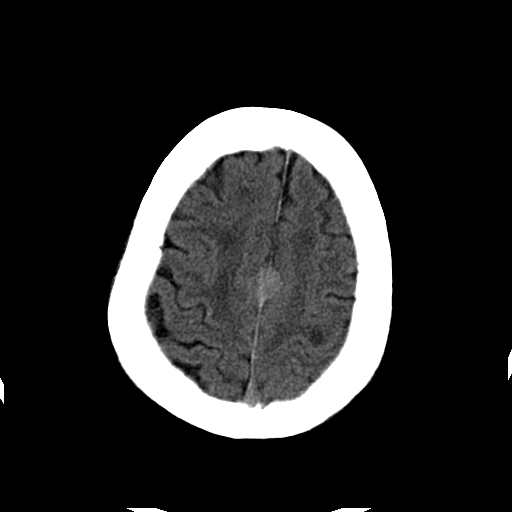
[im 26/34  bone]
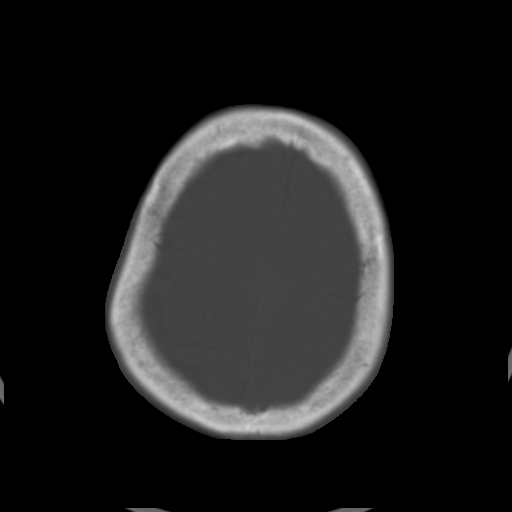
[im 29/34  brain]
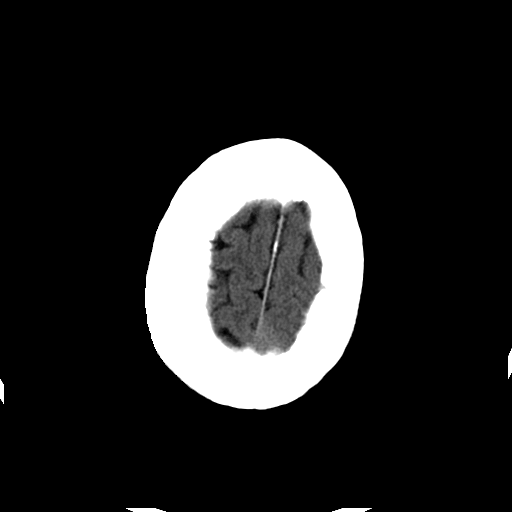
[im 31/34  brain]
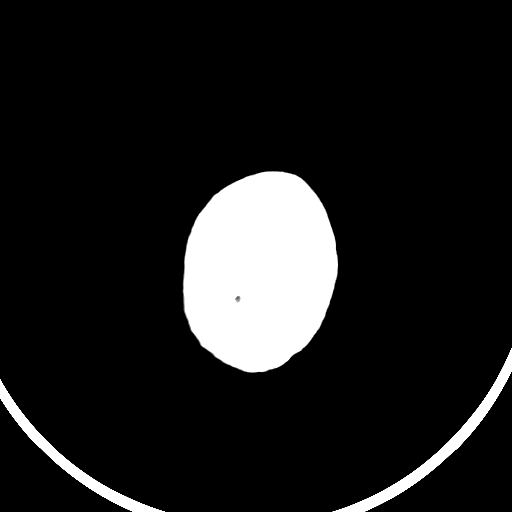

[Series 203: coronal st, idose (1) · coronal · 0.40mm/px · 3 of 76 slices shown]
[im 26/76  brain]
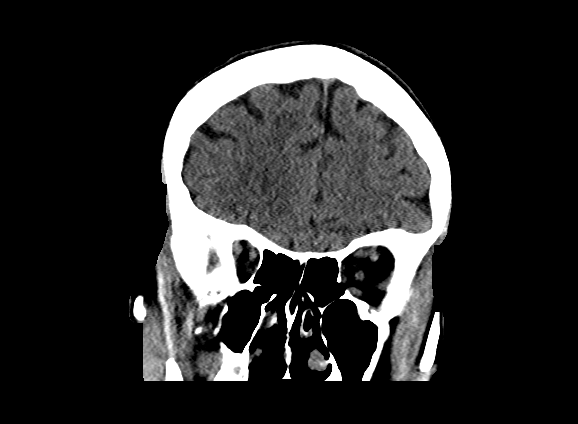
[im 34/76  brain]
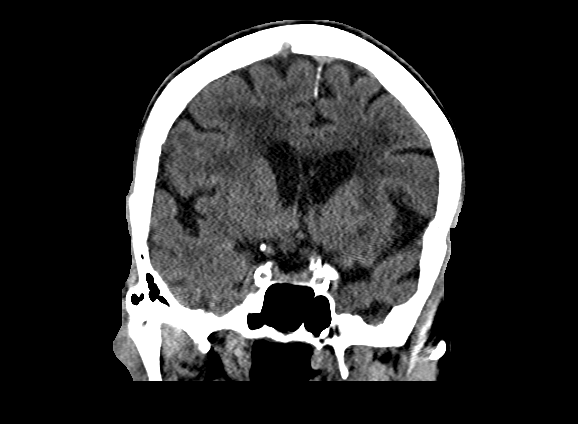
[im 42/76  brain]
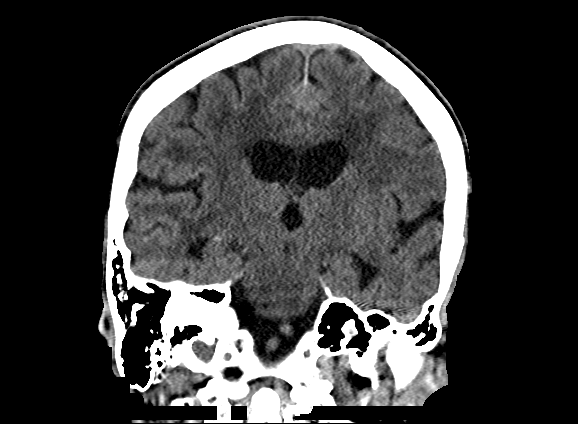

[Series 204: sagittal st, idose (1) · sagittal · 0.40mm/px · 3 of 75 slices shown]
[im 25/75  brain]
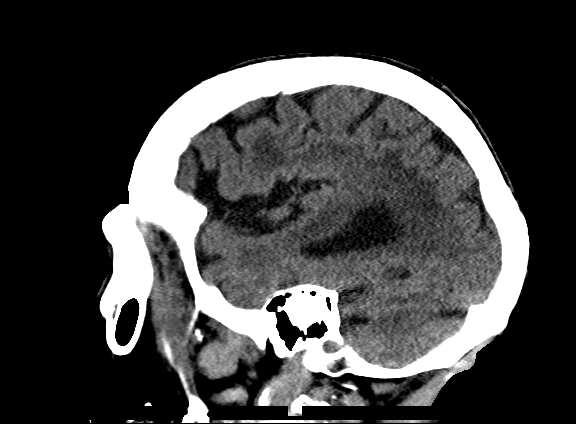
[im 38/75  brain]
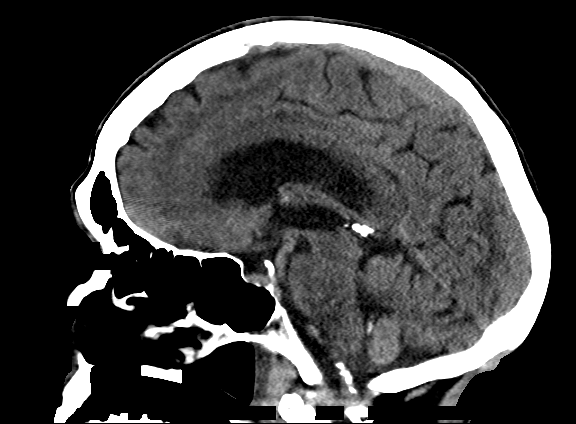
[im 50/75  brain]
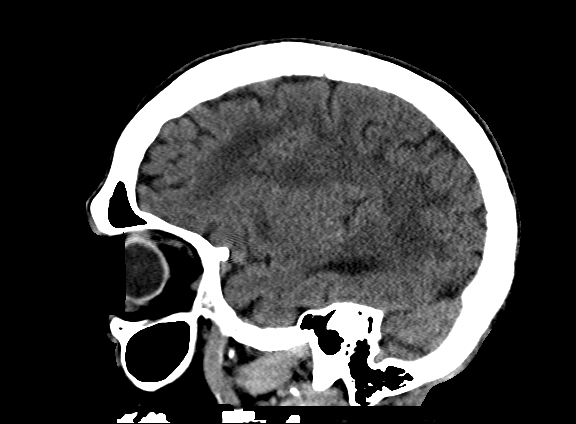

[17 of 47 positions shown; findings below may reference images not displayed]

FINDINGS: Bony calvarium is intact. No gross soft tissue abnormality is noted.
Mild atrophic changes are noted as well as scattered areas of
chronic white matter ischemic change. A hemangioma is noted along
the falx near the vertex scattered basal ganglia calcifications are
seen.
IMPRESSION: Atrophic and chronic white matter ischemic changes.

Hyperdense lesion involving the falx near the vertex likely
representing a meningioma. This measures approximately 18 mm in
greatest dimension.

No acute abnormality is noted.

These results were called by telephone at the time of interpretation
on 06/08/2016 at [DATE] to Dr. Jim, who verbally acknowledged these
results.

## 2016-09-21 IMAGING — DX DG CHEST 1V PORT
1 series · 1 of 1 positions shown · non-contrast
Comparison: 06/08/2016

CLINICAL DATA: Endotracheal tube placement.

EXAM:
PORTABLE CHEST 1 VIEW

[chest ap]
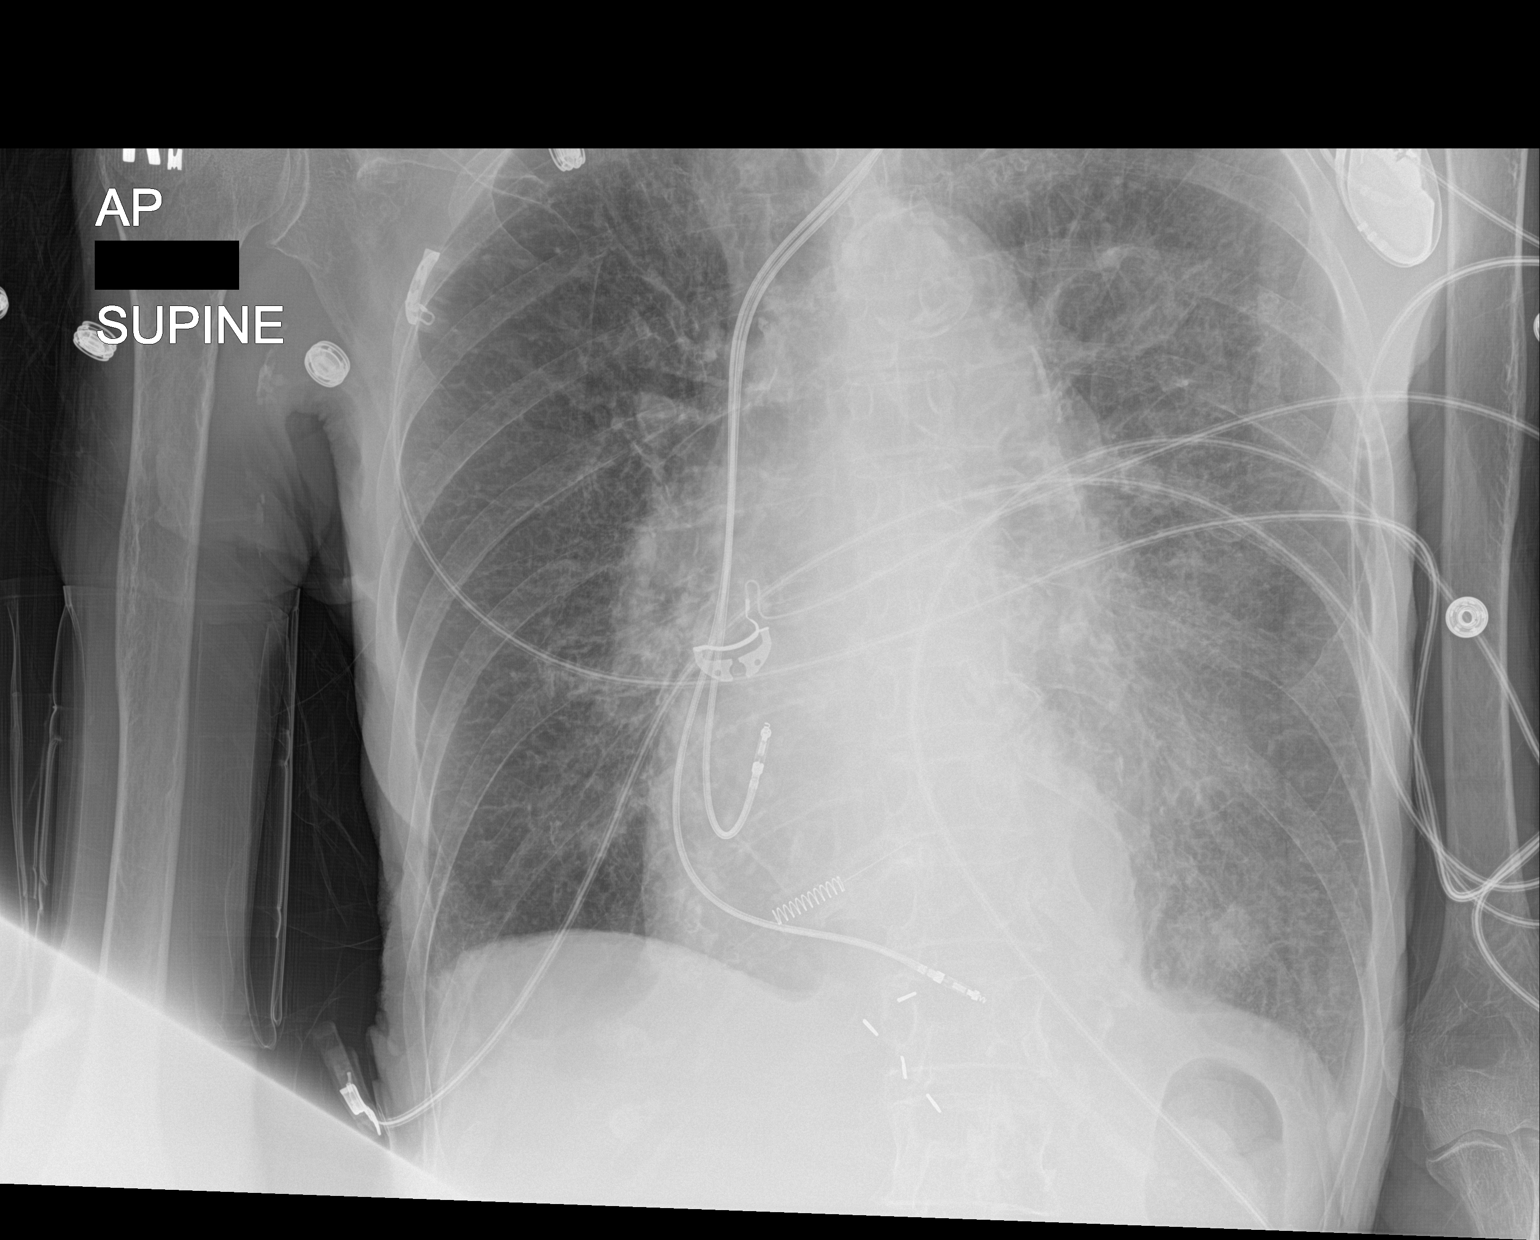

[1 of 1 positions shown; findings below may reference images not displayed]

FINDINGS: New endotracheal tube tip projects 5.7 cm above the carina.

Cardiac silhouette is borderline enlarged. No gross mediastinal or
hilar masses.

Lungs are hyperexpanded with irregularly thickened interstitial
markings. Hazy airspace opacification is noted on the left most
evident centrally accentuated by rotation to the right. This is
similar to the earlier exam.

Left-sided pacemaker is stable.
IMPRESSION: 1. New endotracheal tip projects 5.7 cm above the carina.
2. No significant change in the appearance of the lungs. Findings
may reflect a combination of interstitial and mild hazy airspace
edema from congestive heart failure. There is underlying COPD.

## 2016-09-21 IMAGING — CR DG CHEST 1V PORT
1 series · 1 of 1 positions shown · non-contrast
Comparison: Radiographs 04/04/2010.  CT 05/07/2016.

CLINICAL DATA: Unresponsive with left-sided weakness following
pacemaker placement.

EXAM:
PORTABLE CHEST 1 VIEW

[ap portable]
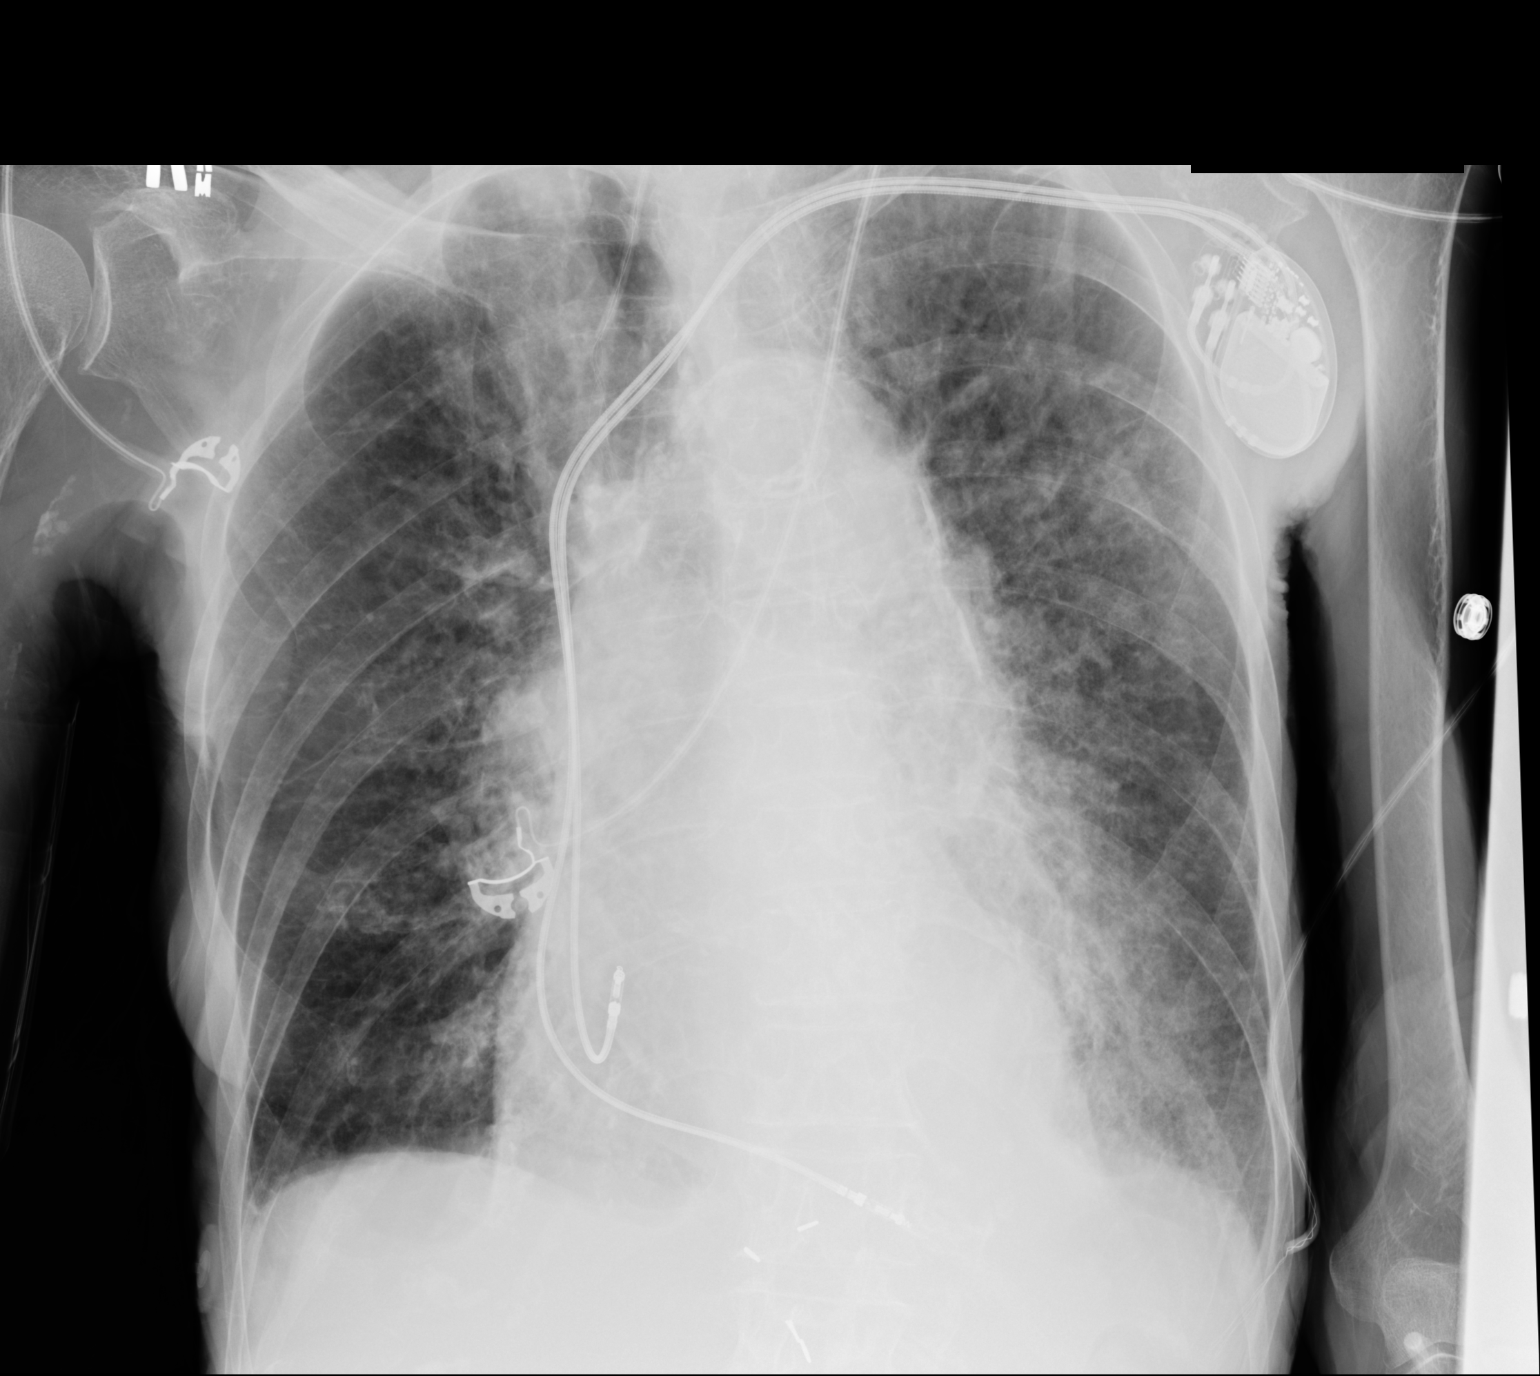

[1 of 1 positions shown; findings below may reference images not displayed]

FINDINGS: 6898 hours. Left subclavian pacemaker leads project over the right
atrium and right ventricle. The heart size and mediastinal contours
are stable allowing for patient rotation to the right. There is
diffuse aortic atherosclerosis. There is vascular congestion with
new asymmetric perihilar and lower lobe airspace disease on the
left. No pneumothorax or significant pleural effusion identified.
The bones appear unchanged.
IMPRESSION: 1. The left subclavian pacemaker leads appear satisfactorily
positioned in the right atrium and right ventricle. No pneumothorax.
2. New vascular congestion with asymmetric airspace disease on the
left consistent with asymmetric edema or aspiration.

## 2016-09-21 MED ORDER — OXYCODONE HCL 5 MG PO TABS
10.0000 mg | ORAL_TABLET | Freq: Once | ORAL | Status: AC
  Administered 2016-09-21: 10 mg via ORAL
  Filled 2016-09-21: qty 2

## 2016-09-21 MED ORDER — ONDANSETRON HCL 4 MG/2ML IJ SOLN: 4.0000 mg | Freq: Once | INTRAMUSCULAR | Status: DC

## 2016-09-21 MED ORDER — SODIUM CHLORIDE 0.9 % IV BOLUS (SEPSIS): 500.0000 mL | Freq: Once | INTRAVENOUS | Status: DC

## 2016-09-21 MED ORDER — MORPHINE SULFATE (PF) 2 MG/ML IV SOLN: 2.0000 mg | Freq: Once | INTRAVENOUS | Status: DC

## 2016-09-21 NOTE — Progress Notes (Signed)
ED visit made. Patient is currently followed by hospice and [facility] at home with a hospice diagnosis of COPD. She is a DNR code, with out of facility DNR in place. Patient came to the [facility] ED today via EMS following and unwitnessed fall at home. She fell on carpet on her left side, she was unable to ambulated/t pain when her hospice nurse assessed her.  Patient seen lying on the ED stretcher, alert and oriented, husband at bedside. Per chart note review patient has a mildly displaced left pelvic rami fracture both inferior and superior. She does report pain with movement. Patient feels that she is unable to return home as her husband has back issues and will be unable to care for her as she is not able to ambulate. Writer discussed a respite stay at the hospice home to give Lydia [name] tome to set up more care in the home, both he and Lydia [name] were agreeable to this plan. Call placed to the hospice home, arrangements made. Writer spoke with both attending physician Dr. Edd Fabian and staff RN Lydia [name], both aware of and in agreement with this plan. Patient to discharge via EMS with portable DNR in place to the hospice home tonight. Mr. Heater to bring patient's home medications to the hospice home. Hospice team updated. Thank you. Lydia Shanks RN, BSN, [place] [facility] [place], [facility] [phone] c

## 2016-09-21 NOTE — ED Provider Notes (Signed)
[facility] Emergency Department Provider Note   ____________________________________________   First MD Initiated Contact with Patient 09/21/16 1511     (approximate)  I have reviewed the triage vital signs and the nursing notes.   HISTORY  Chief Complaint Fall    HPI Lydia [name] is a 82 y.o. female with history of COPD with chronic home O2 requirement, peripheral arterial disease, hypertension,on eliquis who presents for evaluation of left hip pain after mechanical fall which occurred suddenly today just prior to arrival, worse with movements, no modifying factors. Patient reports that she tripped over her tubing to her oxygen take and fell onto the left hip. She denied her head or lose consciousness. She did not faint. She denies any recent illness including no cough, vomiting, diarrhea, fevers or chills. She has otherwise been in her usual state of health without illness. She denies any chest pain. She denies any increased shortness of breath from baseline. He reports she has chronic swelling in her feet which is unchanged from prior.   Past Medical History:  Diagnosis Date  . COPD (chronic obstructive pulmonary disease) ([place])   . Essential hypertension   . History of stomach ulcers    a. s/p surgery ~ 40 yrs ago.  . IDA (iron deficiency anemia)    a. 04/2016 CT Abd: no malignancy.  . Lower back pain   . New onset atrial flutter ([place]) 05/2016  . Osteoporosis   . PAD (peripheral artery disease) ([place])    a. 04/2016 CT Abd: occluded prox LSFA.  . Polycythemia    a. phlebotomy.  . Pulmonary nodule, left    a. 04/2016 CT chest: 22mm LUL nodule - f/u CT in 12 months (h/o smoking).  . Superior mesenteric artery stenosis ([place])    a. 04/2016 CT Abd: atheromatous plaque and mural thrombus in SMA.  . Tachy-brady syndrome ([place]) 05/2016   a. to [facility] for PPM placement  . Thyroid nodule    a. 04/2016 large left thyroid nodule on CT chest  - rec dedicated u/s.  .  Tobacco abuse   . Weight loss, unintentional     Patient Active Problem List   Diagnosis Date Noted  . Pneumonia 08/25/2016  . Emphysema lung ([place]) 07/08/2016  . Tobacco abuse 07/08/2016  . Cachexia ([place]) 07/08/2016  . Protein-calorie malnutrition ([place]) 06/17/2016  . Tachy-brady syndrome ([place]) 06/17/2016  . Acute respiratory failure ([place])   . Essential hypertension 06/14/2016  . Unintentional weight loss 06/14/2016  . Paroxysmal atrial fibrillation ([facility])   . Pressure ulcer 06/09/2016  . Acute hypoxemic respiratory failure ([place])   . Sick sinus syndrome ([place]) 06/08/2016  . Typical atrial flutter ([place])   . Sinus pause   . Chronic bronchitis ([place])   . Smoker unmotivated to quit   . Cerebrovascular accident (CVA) due to embolism of cerebral artery ([place])   . Tachycardia-bradycardia syndrome ([place]) 06/06/2016  . New onset atrial fibrillation ([place]) 06/05/2016  . Protein-calorie malnutrition, severe 06/05/2016  . Degeneration of intervertebral disc of lumbar region 04/01/2015  . H/O adenomatous polyp of colon 10/03/2014  . Neuritis or radiculitis due to rupture of lumbar intervertebral disc 07/11/2014  . Lumbar canal stenosis 07/11/2014  . Chronic obstructive pulmonary disease ([place]) 05/25/2014  . Blood glucose elevated 05/25/2014  . HLD (hyperlipidemia) 05/25/2014  . Anemia, iron deficiency 05/25/2014  . Osteoporosis, post-menopausal 05/25/2014  . Acquired polycythemia 05/25/2014    Past Surgical History:  Procedure Laterality Date  . BLEPHAROPLASTY    .  CATARACT EXTRACTION W/PHACO Right 01/27/2016   Procedure: CATARACT EXTRACTION PHACO AND INTRAOCULAR LENS PLACEMENT (IOC);  Surgeon: Ronnell Freshwater, MD;  Location: [place];  Service: Ophthalmology;  Laterality: Right;  . COLONOSCOPY    . COLONOSCOPY  2005, 2010  . EP IMPLANTABLE DEVICE N/A 06/08/2016   Procedure: Pacemaker Implant;  Surgeon: Evans Lance, MD;  Location: [place] [facility];  Service:  Cardiovascular;  Laterality: N/A;  . STOMACH SURGERY  1995   for ulcers    Prior to Admission medications   Medication Sig Start Date End Date Taking? Authorizing Provider  amiodarone (PACERONE) 200 MG tablet Take 100 mg by mouth daily.     Historical Provider, MD  apixaban (ELIQUIS) 2.5 MG TABS tablet Take 1 tablet (2.5 mg total) by mouth 2 (two) times daily. 06/16/16   Renee Dyane Dustman, PA-C  arformoterol (BROVANA) 15 MCG/2ML NEBU Take 2 mLs (15 mcg total) by nebulization 2 (two) times daily. 08/29/16   Loletha Grayer, MD  atorvastatin (LIPITOR) 10 MG tablet Take 1 tablet (10 mg total) by mouth daily at 6 PM. 06/16/16   Renee Dyane Dustman, PA-C  budesonide (PULMICORT) 0.5 MG/2ML nebulizer solution Take 2 mLs (0.5 mg total) by nebulization 2 (two) times daily. 06/16/16   Renee Dyane Dustman, PA-C  diltiazem (CARDIZEM CD) 240 MG 24 hr capsule Take 1 capsule (240 mg total) by mouth daily. 08/29/16   Loletha Grayer, MD  docusate sodium (COLACE) 100 MG capsule Take 100 mg by mouth at bedtime.    Historical Provider, MD  furosemide (LASIX) 40 MG tablet Take 1 tablet (40 mg total) by mouth at bedtime. 08/29/16   Loletha Grayer, MD  ipratropium-albuterol (DUONEB) 0.5-2.5 (3) MG/3ML SOLN Take 3 mLs by nebulization every 6 (six) hours as needed. 06/16/16   Renee Dyane Dustman, PA-C  levofloxacin (LEVAQUIN) 500 MG tablet Take 1 tablet (500 mg total) by mouth daily. 08/29/16   Loletha Grayer, MD  omeprazole (PRILOSEC) 20 MG capsule Take 20 mg by mouth daily. For Jerrye Bushy 04/30/16   Historical Provider, MD  polyethylene glycol (MIRALAX / GLYCOLAX) packet Take 17 g by mouth daily as needed for mild constipation. 08/29/16   Loletha Grayer, MD  potassium chloride (K-DUR) 10 MEQ tablet Take 1 tablet (10 mEq total) by mouth at bedtime. With furosemide. 08/29/16   Loletha Grayer, MD  predniSONE (DELTASONE) 5 MG tablet 8 tabs po day1; 7 tabs po day2; 6 tabs po day3; 5 tabs po day4; 4 tabs po day5; 3 tabs po day6; 2 tabs po day7, 1 tab  daily afterwards 08/29/16   Loletha Grayer, MD    Allergies Penicillins  Family History  Problem Relation Age of Onset  . Breast cancer Sister   . Other      no premature CAD    Social History Social History  Substance Use Topics  . Smoking status: Former Smoker    Packs/day: 1.00    Years: 65.00    Types: Cigarettes  . Smokeless tobacco: Never Used     Comment: quit smoking 06/04/16  . Alcohol use No    Review of Systems Constitutional: No fever/chills Eyes: No visual changes. ENT: No sore throat. Cardiovascular: Denies chest pain. Respiratory: Denies shortness of breath. Gastrointestinal: No abdominal pain.  No nausea, no vomiting.  No diarrhea.  No constipation. Genitourinary: Negative for dysuria. Musculoskeletal: Negative for back pain. Skin: Negative for rash. Neurological: Negative for headaches, focal weakness or numbness.  10-point ROS otherwise negative.  ____________________________________________  PHYSICAL EXAM:   Vitals:   09/21/16 1512 09/21/16 1607 09/21/16 1630 09/21/16 1700  BP:  130/65 126/67 126/64  Pulse:  60 (!) 59 63  Resp:  (!) 22 (!) 26 (!) 21  Temp:      TempSrc:      SpO2:  100% 100% 100%  Weight: 85 lb (38.6 kg)     Height: 5\' 6"  (1.676 m)      VITAL SIGNS: ED Triage Vitals  Enc Vitals Group     BP 09/21/16 1511 123/64     Pulse Rate 09/21/16 1511 60     Resp 09/21/16 1511 (!) 26     Temp 09/21/16 1511 97.7 F (36.5 C)     Temp Source 09/21/16 1511 Oral     SpO2 09/21/16 1511 100 %     Weight 09/21/16 1512 85 lb (38.6 kg)     Height 09/21/16 1512 5\' 6"  (1.676 m)     Head Circumference --      Peak Flow --      Pain Score 09/21/16 1512 5     Pain Loc --      Pain Edu? --      Excl. in [place]? --     Constitutional: Alert and oriented. Well appearing and in no acute distress. Eyes: Conjunctivae are normal. PERRL. EOMI. Head: Atraumatic. Nose: No congestion/rhinnorhea. Mouth/Throat: Mucous membranes are moist.   Oropharynx non-erythematous. Neck: No stridor. Supple without meningismus. No midline C-spine tenderness to palpation. Cardiovascular: Normal rate, regular rhythm. Grossly normal heart sounds.  Good peripheral circulation. Respiratory: Normal respiratory effort.  No retractions. Lungs CTAB. Gastrointestinal: Soft and nontender. No distention.  No CVA tenderness. Genitourinary: deferred Musculoskeletal: 2+ pitting edema isolated to the feet bilaterally. Pelvis stable to rock and compression. Full active range of motion at the hip joints bilaterally though full flexion in the left hip does cause mild pain. Dopplered E peoples in the feet bilaterally, wiggles the toes.  No joint effusions. No midline T or L-spine tenderness to palpation. Neurologic:  Normal speech and language. No gross focal neurologic deficits are appreciated. No gait instability. Skin:  Skin is warm, dry and intact. No rash noted. Psychiatric: Mood and affect are normal. Speech and behavior are normal.  ____________________________________________   LABS (all labs ordered are listed, but only abnormal results are displayed)  Labs Reviewed  CBC WITH DIFFERENTIAL/PLATELET  BASIC METABOLIC PANEL   ____________________________________________  EKG  none ____________________________________________  RADIOLOGY  Xray left hip IMPRESSION:  1. Mildly displaced left superior and inferior pubic rami fractures.  2. Asymmetric degenerative changes are present in the left hip.  3. No acute fracture of the proximal left femur.  4. Marked osteopenia.  5. Atherosclerosis.    ____________________________________________   PROCEDURES  Procedure(s) performed: None  Procedures  Critical Care performed: No  ____________________________________________   INITIAL IMPRESSION / ASSESSMENT AND PLAN / ED COURSE  Pertinent labs & imaging results that were available during my care of the patient were reviewed by me and  considered in my medical decision making (see chart for details).  Lydia [name] is a 82 y.o. female with history of COPD with chronic home O2 requirement, peripheral arterial disease, hypertension,on eliquis who presents for evaluation of left hip pain after mechanical fall which occurred suddenly today just prior to arrival, worse with movements, no modifying factors. On exam, she is very well-appearing and in no acute distress. Mildly tachypneic but she has chronic COPD and no increased work  of breathing, I suspect this is her baseline, no increased oxygen requirement. The remainder of her vital signs are stable and she is afebrile. Good root movement in the left hip and her exam is generally atraumatic however given her complaint of pain, we will obtain plain films, treat her symptomatically, reassess for disposition.  ----------------------------------------- 5:36 PM on 09/21/2016 ----------------------------------------- Plain films show left superior and inferior pubic rami fracture, patient still with pain, having difficulty ambulating secondary to pain. I discussed the case with Flo Shanks of hospice and the patient has been accepted to hospice home at [facility], she will receive oral morphine there which she is already prescribed. She'll be discharged there and is comfortable with the discharge plan. DC with return precautions.  Clinical Course  Value Comment By Time  DG Hip Unilat W or Wo Pelvis 2-3 Views Left (Reviewed) Joanne Gavel, MD 09/25 1736  DG Hip Unilat W or Wo Pelvis 2-3 Views Left (Reviewed) Joanne Gavel, MD 09/25 1736     ____________________________________________   FINAL CLINICAL IMPRESSION(S) / ED DIAGNOSES  Final diagnoses:  Fall, initial encounter  Acute hip pain, left  Closed fracture of multiple pubic rami, left, initial encounter ([place])      NEW MEDICATIONS STARTED DURING THIS VISIT:  New Prescriptions   No medications on file     Note:   This document was prepared using Dragon voice recognition software and may include unintentional dictation errors.    Joanne Gavel, MD 09/21/16 [phone]

## 2016-09-21 NOTE — ED Triage Notes (Signed)
Pt presents to ED via [facility] for a fall. States she fell over her oxygen tubing and landed on carpet. States L hip pain, no shortening or rotation noted. Pt able to wiggle toes on L foot. Pt states swelling to feet is normal. Denies hitting head or LOC.

## 2016-09-23 ENCOUNTER — Ambulatory Visit: Payer: Medicare HMO | Admitting: Internal Medicine

## 2016-10-01 ENCOUNTER — Inpatient Hospital Stay: Payer: Medicare HMO

## 2016-10-08 ENCOUNTER — Ambulatory Visit: Payer: Medicare HMO

## 2016-10-08 ENCOUNTER — Ambulatory Visit: Payer: Medicare HMO | Admitting: Internal Medicine

## 2016-11-27 DEATH — deceased
